# Patient Record
Sex: Male | Born: 1950 | Race: White | Hispanic: No | Smoking: Former smoker
Health system: Southern US, Community
[De-identification: ages and names within clinical notes are randomized; demographics above are authoritative.]

## PROBLEM LIST (undated history)

## (undated) DIAGNOSIS — R0600 Dyspnea, unspecified: Secondary | ICD-10-CM

## (undated) DIAGNOSIS — I714 Abdominal aortic aneurysm, without rupture, unspecified: Secondary | ICD-10-CM

## (undated) DIAGNOSIS — T8859XA Other complications of anesthesia, initial encounter: Secondary | ICD-10-CM

## (undated) DIAGNOSIS — J449 Chronic obstructive pulmonary disease, unspecified: Secondary | ICD-10-CM

## (undated) DIAGNOSIS — E7800 Pure hypercholesterolemia, unspecified: Secondary | ICD-10-CM

## (undated) DIAGNOSIS — N183 Chronic kidney disease, stage 3 unspecified: Secondary | ICD-10-CM

## (undated) DIAGNOSIS — I2510 Atherosclerotic heart disease of native coronary artery without angina pectoris: Secondary | ICD-10-CM

## (undated) DIAGNOSIS — I509 Heart failure, unspecified: Secondary | ICD-10-CM

## (undated) DIAGNOSIS — C3490 Malignant neoplasm of unspecified part of unspecified bronchus or lung: Secondary | ICD-10-CM

## (undated) DIAGNOSIS — I10 Essential (primary) hypertension: Secondary | ICD-10-CM

## (undated) HISTORY — PX: HAND SURGERY: SHX662

## (undated) HISTORY — PX: IRRIGATION AND DEBRIDEMENT SEBACEOUS CYST: SHX5255

## (undated) HISTORY — DX: Malignant neoplasm of unspecified part of unspecified bronchus or lung: C34.90

## (undated) HISTORY — DX: Chronic obstructive pulmonary disease, unspecified: J44.9

## (undated) HISTORY — PX: HERNIA REPAIR: SHX51

## (undated) HISTORY — DX: Abdominal aortic aneurysm, without rupture: I71.4

## (undated) HISTORY — DX: Essential (primary) hypertension: I10

## (undated) HISTORY — PX: UMBILICAL HERNIA REPAIR: SHX196

## (undated) HISTORY — DX: Abdominal aortic aneurysm, without rupture, unspecified: I71.40

## (undated) HISTORY — PX: INCISIONAL HERNIA REPAIR: SHX193

---

## 2014-02-09 ENCOUNTER — Encounter: Payer: Self-pay | Admitting: Cardiology

## 2014-02-10 ENCOUNTER — Encounter: Payer: Self-pay | Admitting: Cardiology

## 2014-02-13 ENCOUNTER — Encounter: Payer: Self-pay | Admitting: *Deleted

## 2014-02-14 ENCOUNTER — Encounter: Payer: Self-pay | Admitting: *Deleted

## 2014-02-14 ENCOUNTER — Encounter: Payer: Self-pay | Admitting: Cardiology

## 2014-02-14 ENCOUNTER — Ambulatory Visit (INDEPENDENT_AMBULATORY_CARE_PROVIDER_SITE_OTHER): Payer: BC Managed Care – PPO | Admitting: Cardiology

## 2014-02-14 VITALS — BP 147/88 | HR 58 | Ht 70.0 in | Wt 198.0 lb

## 2014-02-14 DIAGNOSIS — R0602 Shortness of breath: Secondary | ICD-10-CM

## 2014-02-14 DIAGNOSIS — I10 Essential (primary) hypertension: Secondary | ICD-10-CM

## 2014-02-14 DIAGNOSIS — I5022 Chronic systolic (congestive) heart failure: Secondary | ICD-10-CM

## 2014-02-14 MED ORDER — CARVEDILOL 3.125 MG PO TABS: 3.1250 mg | ORAL_TABLET | Freq: Two times a day (BID) | ORAL | Status: DC

## 2014-02-14 NOTE — Patient Instructions (Signed)
   Begin Coreg 3.125mg  twice a day - new sent to pharm Continue all other medications.   Your physician has requested that you have a lexiscan myoview. For further information please visit [email or web address]. Please follow instruction sheet, as given. Office will contact with results via phone or letter.   Your physician has requested that you regularly monitor and record your blood pressure readings at home. Please check approximately 1-2 hours after taking your medication.  Bring readings back to next office visit for MD review.   Follow up in  1 month

## 2014-02-14 NOTE — Progress Notes (Signed)
Clinical Summary Nicolas Butler is a 63 y.o.male seen today as a new patient for the following medical problems.  1.HTN - recent admit to [facility] 02/09/14 with severely elevated blood pressures, he had not been on any HTN meds at home. He was originally seen in an outpatient clinic for a cyst removal from his shoulder, and bp was noted to be 241/143. He was sent to ER - despite very high bp, he was asymptomatic. He reportedly had a very mild trop increase of 0.08, along with a BUN of 22 and Cr of 1.4. K 3.7 - initiated initial on IV bp meds, then converted to oral.   - reports compliance with home meds since discharge  2.Chronic Systolic Heart failure - echo during admission showed LVEF 40-45%, mod LVH, abnormal but ungraded diastolic function,  reported akinesis of the basal anterolateral, mid-anterolateral, and apical lateral wall segments.   - denies any chest pain. Does have SOB/DOE, often with high levels of exertion. 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  No orthopnea, no PND, no LE edema    Past Medical History  Diagnosis Date  . Unspecified essential hypertension      No Known Allergies   Current Outpatient Prescriptions  Medication Sig Dispense Refill  . amLODipine (NORVASC) 5 MG tablet Take 1 tablet by mouth daily.      . cephALEXin (KEFLEX) 500 MG capsule Take 1 capsule by mouth 4 (four) times daily.      Marland Kitchen lisinopril-hydrochlorothiazide (PRINZIDE,ZESTORETIC) 20-12.5 MG per tablet Take 1 tablet by mouth daily.      Marland Kitchen NITROSTAT 0.4 MG SL tablet Place 1 tablet under the tongue every 5 (five) minutes x 3 doses as needed.      . carvedilol (COREG) 3.125 MG tablet Take 1 tablet (3.125 mg total) by mouth 2 (two) times daily.  60 tablet  6   No current facility-administered medications for this visit.     Past Surgical History  Procedure Laterality Date  . Hand surgery Left      No Known Allergies    Family History    Problem Relation Age of Onset  . Diabetes Son   . Pneumonia Mother   . Other Father     brain tumor     Social History Mr. Oki reports that he quit smoking about 14 months ago. His smoking use included Cigarettes. He started smoking about 48 years ago. He smoked 0.00 packs per day for 46 years. He has never used smokeless tobacco. Mr. Wollman has no alcohol history on file.   Review of Systems CONSTITUTIONAL: No weight loss, fever, chills, weakness or fatigue.  HEENT: Eyes: No visual loss, blurred vision, double vision or yellow sclerae.No hearing loss, sneezing, congestion, runny nose or sore throat.  SKIN: No rash or itching.  CARDIOVASCULAR: per HPI RESPIRATORY: No shortness of breath, cough or sputum.  GASTROINTESTINAL: No anorexia, nausea, vomiting or diarrhea. No abdominal pain or blood.  GENITOURINARY: No burning on urination, no polyuria NEUROLOGICAL: No headache, dizziness, syncope, paralysis, ataxia, numbness or tingling in the extremities. No change in bowel or bladder control.  MUSCULOSKELETAL: No muscle, back pain, joint pain or stiffness.  LYMPHATICS: No enlarged nodes. No history of splenectomy.  PSYCHIATRIC: No history of depression or anxiety.  ENDOCRINOLOGIC: No reports of sweating, cold or heat intolerance. No polyuria or polydipsia.  Marland Kitchen   Physical Examination Filed Vitals:   02/14/14 1544  BP: 147/88  Pulse: 58   Filed Weights   02/14/14 1544  Weight: 198 lb (89.812 kg)    Gen: resting comfortably, no acute distress HEENT: no scleral icterus, pupils equal round and reactive, no palptable cervical adenopathy,  CV: RRR, no m/r/g, no JVD, no carotid bruits Resp: Clear to auscultation bilaterally GI: abdomen is soft, non-tender, non-distended, normal bowel sounds, no hepatosplenomegaly MSK: extremities are warm, no edema.  Skin: warm, no rash Neuro:  no focal deficits Psych: appropriate affect    Assessment and Plan  1. HTN - elevated in clinic  today, will start coreg 6.25mg  bid in the setting of systolic dysfunction. - he is to bring bp log next visit  2. Chronic systolic heart failure - new diagnosis from admission earlier this month, LVEF 40-45% with wall motion abnormalities, NYHA II - start coreg 3.125mg  bid with further titration as tolerated - refer for non-invasive ischemic evaluation. He has some underlying renal dysfunction and only mild LV systolic dysfunction, defer cath for non-invasive testing at this point  F/u 1 month      Arnoldo Lenis, M.D., F.A.C.C.

## 2014-02-20 ENCOUNTER — Encounter (HOSPITAL_COMMUNITY): Payer: Self-pay

## 2014-02-20 ENCOUNTER — Encounter (HOSPITAL_COMMUNITY)
Admission: RE | Admit: 2014-02-20 | Discharge: 2014-02-20 | Disposition: A | Discharge status: Home / Self Care | Payer: BC Managed Care – PPO | Source: Ambulatory Visit | Attending: Cardiology | Admitting: Cardiology

## 2014-02-20 ENCOUNTER — Other Ambulatory Visit: Payer: Self-pay | Admitting: Adult Health

## 2014-02-20 ENCOUNTER — Ambulatory Visit (HOSPITAL_COMMUNITY)
Admission: RE | Admit: 2014-02-20 | Discharge: 2014-02-20 | Disposition: A | Discharge status: Home / Self Care | Payer: BC Managed Care – PPO | Source: Ambulatory Visit | Attending: Cardiology | Admitting: Cardiology

## 2014-02-20 ENCOUNTER — Telehealth: Payer: Self-pay | Admitting: Cardiology

## 2014-02-20 DIAGNOSIS — I2510 Atherosclerotic heart disease of native coronary artery without angina pectoris: Secondary | ICD-10-CM | POA: Insufficient documentation

## 2014-02-20 DIAGNOSIS — R0602 Shortness of breath: Secondary | ICD-10-CM | POA: Insufficient documentation

## 2014-02-20 DIAGNOSIS — R0989 Other specified symptoms and signs involving the circulatory and respiratory systems: Principal | ICD-10-CM | POA: Insufficient documentation

## 2014-02-20 DIAGNOSIS — R0609 Other forms of dyspnea: Secondary | ICD-10-CM | POA: Insufficient documentation

## 2014-02-20 MED ORDER — SODIUM CHLORIDE 0.9 % IJ SOLN
INTRAMUSCULAR | Status: AC
  Filled 2014-02-20: qty 10

## 2014-02-20 MED ORDER — TECHNETIUM TC 99M SESTAMIBI - CARDIOLITE
30.0000 | Freq: Once | INTRAVENOUS | Status: AC | PRN
  Administered 2014-02-20: 10:00:00 30 via INTRAVENOUS

## 2014-02-20 MED ORDER — TECHNETIUM TC 99M SESTAMIBI GENERIC - CARDIOLITE
10.0000 | Freq: Once | INTRAVENOUS | Status: AC | PRN
  Administered 2014-02-20: 10 via INTRAVENOUS

## 2014-02-20 MED ORDER — REGADENOSON 0.4 MG/5ML IV SOLN
0.4000 mg | Freq: Once | INTRAVENOUS | Status: AC | PRN
  Administered 2014-02-20: 0.4 mg via INTRAVENOUS

## 2014-02-20 MED ORDER — REGADENOSON 0.4 MG/5ML IV SOLN
INTRAVENOUS | Status: AC
  Filled 2014-02-20: qty 5

## 2014-02-20 MED ORDER — SODIUM CHLORIDE 0.9 % IJ SOLN
10.0000 mL | INTRAMUSCULAR | Status: DC | PRN
  Administered 2014-02-20: 10 mL via INTRAVENOUS

## 2014-02-20 NOTE — Telephone Encounter (Signed)
Stress test shows evidence of possible old damage to his heart from prior blockages, there does not appear to be any severe active blockages at this time. From a work standpoint if he is not having any symptoms of chest pain or significant SOB he is ok to return to work at the present time. He has been started on several medications that over time should help his heart   Zandra Abts MD

## 2014-02-20 NOTE — Progress Notes (Signed)
Stress Lab Nurses Notes - Nicolas Butler  Nicolas Butler 02/20/2014 Reason for doing test: Dyspnea Type of test: Wille Glaser Nurse performing test: Gerrit Halls, RN Nuclear Medicine Tech: Dyanne Carrel Echo Tech: Not Applicable MD performing test: Dr. Lynann Beaver.Lawrence NP Family MD: NPCP Test explained and consent signed: yes IV started: 22g jelco, Saline lock flushed, No redness or edema and Saline lock started in radiology Symptoms: SOB & Flushed Treatment/Intervention: None Reason test stopped: protocol completed After recovery IV was: Discontinued via X-ray tech and No redness or edema Patient to return to [facility]. Med at : 11:25 Patient discharged: Home Patient's Condition upon discharge was: stable Comments: During test BP 123/79 & HR 72.  Recovery BP 139/92 & HR 69.  Symptoms resolved in recovery. Geanie Cooley T

## 2014-02-20 NOTE — Telephone Encounter (Signed)
Patient would like to know when he is allowed to return to work

## 2014-02-20 NOTE — Telephone Encounter (Signed)
Patient questioning when he can return to work.  Last seen on 02/14/2014.  At that office visit he was given a letter stating that he could return to work on 02/26/14 without restrictions.  Patient just had stress test today (results are on your desktop).  He is scheduled for follow up on 03/16/2014.  Please let me know if this still stands based off those test results.  Stress test did not appear to be normal.

## 2014-02-21 NOTE — Telephone Encounter (Signed)
Patient notified.  He will keep already scheduled follow up for 03/16/2014 with Dr. Harl Bowie.

## 2014-03-04 DIAGNOSIS — I5022 Chronic systolic (congestive) heart failure: Secondary | ICD-10-CM | POA: Insufficient documentation

## 2014-03-04 DIAGNOSIS — I10 Essential (primary) hypertension: Secondary | ICD-10-CM | POA: Insufficient documentation

## 2014-03-14 ENCOUNTER — Encounter: Payer: Self-pay | Admitting: *Deleted

## 2014-03-16 ENCOUNTER — Ambulatory Visit (INDEPENDENT_AMBULATORY_CARE_PROVIDER_SITE_OTHER): Payer: BC Managed Care – PPO | Admitting: Cardiology

## 2014-03-16 ENCOUNTER — Encounter: Payer: Self-pay | Admitting: Cardiology

## 2014-03-16 VITALS — BP 152/98 | HR 56 | Ht 70.0 in | Wt 196.0 lb

## 2014-03-16 DIAGNOSIS — I2510 Atherosclerotic heart disease of native coronary artery without angina pectoris: Secondary | ICD-10-CM

## 2014-03-16 DIAGNOSIS — I5022 Chronic systolic (congestive) heart failure: Secondary | ICD-10-CM

## 2014-03-16 DIAGNOSIS — Z79899 Other long term (current) drug therapy: Secondary | ICD-10-CM

## 2014-03-16 DIAGNOSIS — I10 Essential (primary) hypertension: Secondary | ICD-10-CM

## 2014-03-16 MED ORDER — LISINOPRIL 40 MG PO TABS: 40.0000 mg | ORAL_TABLET | Freq: Every day | ORAL | Status: DC

## 2014-03-16 MED ORDER — HYDROCHLOROTHIAZIDE 12.5 MG PO CAPS: 12.5000 mg | ORAL_CAPSULE | Freq: Every day | ORAL | Status: DC

## 2014-03-16 NOTE — Progress Notes (Signed)
Clinical Summary Nicolas Butler is a 64 y.o.male seen today for follow up of the following medical problems.   1.HTN  - admit to [facility] 02/09/14 with severely elevated blood pressures, he had not been on any HTN meds at home. He was originally seen in an outpatient clinic for a cyst removal from his shoulder, and bp was noted to be 241/143. He was sent to ER  - despite very high bp, he was asymptomatic. He reportedly had a very mild trop increase of 0.08, along with a BUN of 22 and Cr of 1.4. K 3.7  - initiated initial on IV bp meds, then converted to oral.  - reports compliance with home meds since discharge   - bp log since our last visit 120-140s/70-80s.  - compliant with meds, denies any lightheadedness or dizziness  2.Chronic Systolic Heart failure  - echo during admission showed LVEF 40-45%, mod LVH, abnormal but ungraded diastolic function, reported akinesis of the basal anterolateral, mid-anterolateral, and apical lateral wall segments.  - denies any chest pain.SOB/DOE has signficantly since starting medical therapy.  - No orthopnea, no PND, no LE edema  - since last visit completed a lexiscan MPI which showed moderate sized interoapical and inferolateral infarct with mild peri-infarct ischemia.  Past Medical History  Diagnosis Date  . Unspecified essential hypertension      No Known Allergies   Current Outpatient Prescriptions  Medication Sig Dispense Refill  . amLODipine (NORVASC) 5 MG tablet Take 1 tablet by mouth daily.      . carvedilol (COREG) 3.125 MG tablet Take 1 tablet (3.125 mg total) by mouth 2 (two) times daily.  60 tablet  6  . cephALEXin (KEFLEX) 500 MG capsule Take 1 capsule by mouth 4 (four) times daily.      Marland Kitchen lisinopril-hydrochlorothiazide (PRINZIDE,ZESTORETIC) 20-12.5 MG per tablet Take 1 tablet by mouth daily.      Marland Kitchen NITROSTAT 0.4 MG SL tablet Place 1 tablet under the tongue every 5 (five) minutes x 3 doses as needed.       No current  facility-administered medications for this visit.     Past Surgical History  Procedure Laterality Date  . Hand surgery Left   . Umbilical hernia repair    . Incisional hernia repair      LEFT  . Irrigation and debridement sebaceous cyst      FROM RIGHT SHOULDER     No Known Allergies    Family History  Problem Relation Age of Onset  . Diabetes Son   . Pneumonia Mother   . Other Father     brain tumor     Social History Mr. Paynter reports that he quit smoking about 15 months ago. His smoking use included Cigarettes. He started smoking about 48 years ago. He smoked 0.00 packs per day for 46 years. He has never used smokeless tobacco. Mr. Trimpe has no alcohol history on file.   Review of Systems CONSTITUTIONAL: No weight loss, fever, chills, weakness or fatigue.  HEENT: Eyes: No visual loss, blurred vision, double vision or yellow sclerae.No hearing loss, sneezing, congestion, runny nose or sore throat.  SKIN: No rash or itching.  CARDIOVASCULAR: per HPI RESPIRATORY: no cough, no sputum  GASTROINTESTINAL: No anorexia, nausea, vomiting or diarrhea. No abdominal pain or blood.  GENITOURINARY: No burning on urination, no polyuria NEUROLOGICAL: No headache, dizziness, syncope, paralysis, ataxia, numbness or tingling in the extremities. No change in bowel or bladder control.  MUSCULOSKELETAL: No muscle, back  pain, joint pain or stiffness.  LYMPHATICS: No enlarged nodes. No history of splenectomy.  PSYCHIATRIC: No history of depression or anxiety.  ENDOCRINOLOGIC: No reports of sweating, cold or heat intolerance. No polyuria or polydipsia.  Marland Kitchen   Physical Examination p 56 bp 152/98 Wt 196 lbs BMI 28 Gen: resting comfortably, no acute distress HEENT: no scleral icterus, pupils equal round and reactive, no palptable cervical adenopathy,  CV: RRR, no m/r/g, no JVD, no carotid bruits Resp: Clear to auscultation bilaterally GI: abdomen is soft, non-tender, non-distended,  normal bowel sounds, no hepatosplenomegaly MSK: extremities are warm, no edema.  Skin: warm, no rash Neuro:  no focal deficits Psych: appropriate affect   Diagnostic Studies 02/2014 MPI Raw images showed appropriate radiotracer uptake. There is a  moderate-sized mildly reversible inferoapical and inferolateral wall  defect. There are no other myocardial perfusion defects. There is  mild inferolateral wall hypokinesis.  Gated imaging shows end-diastolic volume 594 mL, and systolic volume  90 mL, left ventricular ejection fraction 40%.  IMPRESSION:  1. Abnormal Lexiscan MPI  2. 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  3. Decreased left ventricular systolic function, LVEF 58%  4. Increased study for major cardiac events due to decreased LV  systolic function, there is fairly mild myocardium at jeopardy     Assessment and Plan  1. HTN  - number much improved since starting medical therapy, given his CKD still not at goal of <130/80 - increase lisinopril to 40mg  daily, continue home bp log - check BMET in 2 weeks  2. Chronic systolic heart failure  - new diagnosis from admission earlier this month, LVEF 40-45% with wall motion abnormalities, NYHA II  - heart rate of 55 in clinic, no further coreg titration at this time. Increase lisinopril to 40mg  daily.  - MPI shows evidence of possible old infarct, likely a component of ICM, though could be mixed with a HTN CM as well.   3. Presumed CAD - MPI and echo with evidence of old infarct, no current chest pain. Only mild peri-infarct ischemia that is asymptomatic, no indication for invasive testing at this time - start ASA 81mg  daily, check lipid panel and Hgb A1c for further risk stratification    F/u 1 month    Arnoldo Lenis, M.D., F.A.C.C.

## 2014-03-16 NOTE — Patient Instructions (Signed)
   Stop Prinzide  Begin Lisinopril 40mg  daily  Begin HCTZ 12.5mg  daily  New medications sent to pharm  Begin Aspirin 81mg  daily - OTC Continue all other medications.   Your physician has requested that you regularly monitor and record your blood pressure readings at home. Please check approximately 2 hours after medication & bring to next office visit for MD review. Labs for FLP, BMET, [place] Office will contact with results via phone or letter.   Follow up in  1 month

## 2014-04-13 ENCOUNTER — Encounter: Payer: Self-pay | Admitting: Cardiology

## 2014-04-13 ENCOUNTER — Ambulatory Visit (INDEPENDENT_AMBULATORY_CARE_PROVIDER_SITE_OTHER): Payer: BC Managed Care – PPO | Admitting: Cardiology

## 2014-04-13 VITALS — BP 144/92 | HR 68 | Ht 70.0 in | Wt 199.0 lb

## 2014-04-13 DIAGNOSIS — I5022 Chronic systolic (congestive) heart failure: Secondary | ICD-10-CM

## 2014-04-13 DIAGNOSIS — I10 Essential (primary) hypertension: Secondary | ICD-10-CM

## 2014-04-13 MED ORDER — AMLODIPINE BESYLATE 5 MG PO TABS: 5.0000 mg | ORAL_TABLET | Freq: Every day | ORAL | Status: DC

## 2014-04-13 MED ORDER — ASPIRIN EC 81 MG PO TBEC: 81.0000 mg | DELAYED_RELEASE_TABLET | Freq: Every day | ORAL | Status: DC

## 2014-04-13 NOTE — Patient Instructions (Signed)
   Aspirin 81mg  daily added to med list  Norvasc refill sent to local pharm Continue all other medications.   Your physician wants you to follow up in:  4 months.  You will receive a reminder letter in the mail one-two months in advance.  If you don't receive a letter, please call our office to schedule the follow up appointment

## 2014-04-13 NOTE — Progress Notes (Signed)
Clinical Summary Nicolas Butler is a 63 y.o.male seen today for follow up of the following medical problems.   1.HTN  - admit to [facility] 02/09/14 with severely elevated blood pressures, he had not been on any HTN meds at home. He was originally seen in an outpatient clinic for a cyst removal from his shoulder, and bp was noted to be 241/143. He was sent to ER  - despite very high bp, he was asymptomatic. He reportedly had a very mild trop increase of 0.08, along with a BUN of 22 and Cr of 1.4. K 3.7  - initiated initially on IV bp meds, then converted to oral.  - reports compliance with home meds since discharge  - bp log since our last visit 120-130s/70-80s.  - compliant with meds, denies any lightheadedness or dizziness   2.Chronic Systolic Heart failure  - echo during admission showed LVEF 40-45%, mod LVH, abnormal but ungraded diastolic function, reported akinesis of the basal anterolateral, mid-anterolateral, and apical lateral wall segments.  - denies any chest pain.SOB/DOE has signficantly improved since starting medical therapy.  - No orthopnea, no PND, no LE edema  - lexiscan MPI which showed moderate sized interoapical and inferolateral infarct with mild peri-infarct ischemia.   3. Presumed CAD - based on echo and MPI results, MPI with suggestion of prior infarcts with only mild peri-infarct ischemia. - denies any chest pain, he has started ASA 81mg  since last visit - awaiting lipid panel, reports it was drawn at [facility] last week. Awaiting HgbA1c as well for further risk stratification.    Past Medical History  Diagnosis Date  . Unspecified essential hypertension      No Known Allergies   Current Outpatient Prescriptions  Medication Sig Dispense Refill  . amLODipine (NORVASC) 5 MG tablet Take 1 tablet by mouth daily.      . carvedilol (COREG) 3.125 MG tablet Take 1 tablet (3.125 mg total) by mouth 2 (two) times daily.  60 tablet  6  . hydrochlorothiazide (MICROZIDE)  12.5 MG capsule Take 1 capsule (12.5 mg total) by mouth daily.  30 capsule  6  . lisinopril (PRINIVIL,ZESTRIL) 40 MG tablet Take 1 tablet (40 mg total) by mouth daily.  30 tablet  6  . NITROSTAT 0.4 MG SL tablet Place 1 tablet under the tongue every 5 (five) minutes x 3 doses as needed.       No current facility-administered medications for this visit.     Past Surgical History  Procedure Laterality Date  . Hand surgery Left   . Umbilical hernia repair    . Incisional hernia repair      LEFT  . Irrigation and debridement sebaceous cyst      FROM RIGHT SHOULDER     No Known Allergies    Family History  Problem Relation Age of Onset  . Diabetes Son   . Pneumonia Mother   . Other Father     brain tumor     Social History Nicolas Butler reports that he quit smoking about 16 months ago. His smoking use included Cigarettes. He started smoking about 48 years ago. He smoked 0.00 packs per day for 46 years. He has never used smokeless tobacco. Nicolas Butler has no alcohol history on file.   Review of Systems CONSTITUTIONAL: No weight loss, fever, chills, weakness or fatigue.  HEENT: Eyes: No visual loss, blurred vision, double vision or yellow sclerae.No hearing loss, sneezing, congestion, runny nose or sore throat.  SKIN: No  rash or itching.  CARDIOVASCULAR: per HPI RESPIRATORY: No shortness of breath, cough or sputum.  GASTROINTESTINAL: No anorexia, nausea, vomiting or diarrhea. No abdominal pain or blood.  GENITOURINARY: No burning on urination, no polyuria NEUROLOGICAL: No headache, dizziness, syncope, paralysis, ataxia, numbness or tingling in the extremities. No change in bowel or bladder control.  MUSCULOSKELETAL: No muscle, back pain, joint pain or stiffness.  LYMPHATICS: No enlarged nodes. No history of splenectomy.  PSYCHIATRIC: No history of depression or anxiety.  ENDOCRINOLOGIC: No reports of sweating, cold or heat intolerance. No polyuria or polydipsia.   Marland Kitchen   Physical Examination p 68 bp 144/92 Wt 199 lbs BMI 29 Gen: resting comfortably, no acute distress HEENT: no scleral icterus, pupils equal round and reactive, no palptable cervical adenopathy,  CV: RRR, no m/r/g, no JVD, no carotid bruits Resp: Clear to auscultation bilaterally GI: abdomen is soft, non-tender, non-distended, normal bowel sounds, no hepatosplenomegaly MSK: extremities are warm, no edema.  Skin: warm, no rash Neuro:  no focal deficits Psych: appropriate affect   Diagnostic Studies 02/2014 MPI  Raw images showed appropriate radiotracer uptake. There is a  moderate-sized mildly reversible inferoapical and inferolateral wall  defect. There are no other myocardial perfusion defects. There is  mild inferolateral wall hypokinesis.  Gated imaging shows end-diastolic volume 163 mL, and systolic volume  90 mL, left ventricular ejection fraction 40%.  IMPRESSION:  1. Abnormal Lexiscan MPI  2. 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  3. Decreased left ventricular systolic function, LVEF 84%  4. Increased study for major cardiac events due to decreased LV  systolic function, there is fairly mild myocardium at jeopardy   02/2014 Echo LVEF 40-45%, abnormal diastolic function. Multiple WMAs,      Assessment and Plan  1. HTN  - at goal based home home bp log, given his CKD goal of <130/80  - awaiting repeat BMET after lisinopril increase a few weeks ago  2. Chronic systolic heart failure  - new diagnosis from admission 02/2014, LVEF 40-45% with wall motion abnormalities, NYHA II  - MPI shows evidence of possible old infarct, likely a component of ICM, though could be mixed with a HTN CM as well.  - limited beta blocker titration due to low resting heart rates.   - no aldactone at this time due to borderline renal function  3. Presumed CAD  - MPI and echo with evidence of old infarct, no current  chest pain. Only mild peri-infarct ischemia that is asymptomatic, no indication for invasive testing at this time. He also has borderline renal function and is at increased risk for cath.  - will have nursing staff call about labs drawn last week. Likely will need a statin, f/u lipid profile.    F/u 4 months       Arnoldo Lenis, M.D., F.A.C.C.

## 2014-04-26 ENCOUNTER — Telehealth: Payer: Self-pay | Admitting: *Deleted

## 2014-04-26 DIAGNOSIS — I10 Essential (primary) hypertension: Secondary | ICD-10-CM

## 2014-04-26 DIAGNOSIS — E7800 Pure hypercholesterolemia, unspecified: Secondary | ICD-10-CM

## 2014-04-26 MED ORDER — ATORVASTATIN CALCIUM 80 MG PO TABS: 80.0000 mg | ORAL_TABLET | Freq: Every day | ORAL | Status: DC

## 2014-04-26 NOTE — Telephone Encounter (Signed)
Message copied by Laurine Blazer on Thu Apr 26, 2014  1:12 PM ------      Message from: [place] F      Created: Tue Apr 17, 2014 12:49 PM       Cholesterol is too high. He needs a repeat CMET prior to starting a statin, once results in will start lipitor 80mg  daily.                  Zandra Abts MD ------

## 2014-04-26 NOTE — Telephone Encounter (Signed)
Notes Recorded by Laurine Blazer, LPN on 2/56/3893 at 7:34 PM Patient notified. Will fax lab order to [facility] & he will go in the next few days. Will also send new rx to [place].

## 2014-08-21 ENCOUNTER — Ambulatory Visit (INDEPENDENT_AMBULATORY_CARE_PROVIDER_SITE_OTHER): Payer: 59 | Admitting: Cardiology

## 2014-08-21 ENCOUNTER — Encounter: Payer: Self-pay | Admitting: Cardiology

## 2014-08-21 VITALS — BP 151/98 | HR 64 | Ht 70.0 in | Wt 197.0 lb

## 2014-08-21 DIAGNOSIS — I2510 Atherosclerotic heart disease of native coronary artery without angina pectoris: Secondary | ICD-10-CM

## 2014-08-21 DIAGNOSIS — I10 Essential (primary) hypertension: Secondary | ICD-10-CM

## 2014-08-21 DIAGNOSIS — I5022 Chronic systolic (congestive) heart failure: Secondary | ICD-10-CM

## 2014-08-21 NOTE — Patient Instructions (Addendum)
Your physician wants you to follow-up in: 6 months. You will receive a reminder letter in the mail two months in advance. If you don't receive a letter, please call our office to schedule the follow-up appointment.  Your physician recommends that you continue on your current medications as directed. Please refer to the Current Medication list given to you today.   Thank you for choosing [facility]!

## 2014-08-21 NOTE — Progress Notes (Signed)
Clinical Summary Nicolas Butler is a 63 y.o.male seen today for follow up of the following medical problems.   1.HTN  - checks at home daily. Typically around 130s/80s - compliant with meds  2.Chronic Systolic Heart failure  - echo showed LVEF 40-45%, mod LVH, abnormal but ungraded diastolic function, reported akinesis of the basal anterolateral, mid-anterolateral, and apical lateral wall segments.  - lexiscan MPI  showed moderate sized interoapical and inferolateral infarct with mild peri-infarct ischemia.   - denies any SOB, no DOE, no LE edema - compliant with meds. No lightheadendess or dizziness  3. Presumed CAD - based on echo and MPI results, MPI with suggestion of prior infarcts with only mild peri-infarct ischemia. This has been medically managed.  - denies any chest pain  4. HL - lipid panel 03/2014 TC 233 TG 181 HDL 19 LDL 178. This lipid panel is prior to starting statin, currently on lipitor 80mg  daily.   Past Medical History  Diagnosis Date  . Unspecified essential hypertension      No Known Allergies   Current Outpatient Prescriptions  Medication Sig Dispense Refill  . amLODipine (NORVASC) 5 MG tablet Take 1 tablet (5 mg total) by mouth daily. 30 tablet 6  . aspirin EC 81 MG tablet Take 1 tablet (81 mg total) by mouth daily.    Marland Kitchen atorvastatin (LIPITOR) 80 MG tablet Take 1 tablet (80 mg total) by mouth daily. 30 tablet 6  . carvedilol (COREG) 3.125 MG tablet Take 1 tablet (3.125 mg total) by mouth 2 (two) times daily. 60 tablet 6  . hydrochlorothiazide (MICROZIDE) 12.5 MG capsule Take 1 capsule (12.5 mg total) by mouth daily. 30 capsule 6  . lisinopril (PRINIVIL,ZESTRIL) 40 MG tablet Take 1 tablet (40 mg total) by mouth daily. 30 tablet 6  . NITROSTAT 0.4 MG SL tablet Place 1 tablet under the tongue every 5 (five) minutes x 3 doses as needed.     No current facility-administered medications for this visit.     Past Surgical History  Procedure  Laterality Date  . Hand surgery Left   . Umbilical hernia repair    . Incisional hernia repair      LEFT  . Irrigation and debridement sebaceous cyst      FROM RIGHT SHOULDER     No Known Allergies    Family History  Problem Relation Age of Onset  . Diabetes Son   . Pneumonia Mother   . Other Father     brain tumor     Social History Mr. Stief reports that he quit smoking about 20 months ago. His smoking use included Cigarettes. He started smoking about 49 years ago. He smoked 0.00 packs per day for 46 years. He has never used smokeless tobacco. Mr. Bean has no alcohol history on file.   Review of Systems CONSTITUTIONAL: No weight loss, fever, chills, weakness or fatigue.  HEENT: Eyes: No visual loss, blurred vision, double vision or yellow sclerae.No hearing loss, sneezing, congestion, runny nose or sore throat.  SKIN: No rash or itching.  CARDIOVASCULAR: per HPI RESPIRATORY: No shortness of breath, cough or sputum.  GASTROINTESTINAL: No anorexia, nausea, vomiting or diarrhea. No abdominal pain or blood.  GENITOURINARY: No burning on urination, no polyuria NEUROLOGICAL: No headache, dizziness, syncope, paralysis, ataxia, numbness or tingling in the extremities. No change in bowel or bladder control.  MUSCULOSKELETAL: No muscle, back pain, joint pain or stiffness.  LYMPHATICS: No enlarged nodes. No history of splenectomy.  PSYCHIATRIC:  No history of depression or anxiety.  ENDOCRINOLOGIC: No reports of sweating, cold or heat intolerance. No polyuria or polydipsia.  Marland Kitchen   Physical Examination p 56 bp 116/78 Wt 230 lbs BMI 34 Gen: resting comfortably, no acute distress HEENT: no scleral icterus, pupils equal round and reactive, no palptable cervical adenopathy,  CV: RRR, no m/r/g, no JVD, no carotid bruits Resp: Clear to auscultation bilaterally GI: abdomen is soft, non-tender, non-distended, normal bowel sounds, no hepatosplenomegaly MSK: extremities are warm, no  edema.  Skin: warm, no rash Neuro:  no focal deficits Psych: appropriate affect   Diagnostic Studies 02/2014 MPI  Raw images showed appropriate radiotracer uptake. There is a  moderate-sized mildly reversible inferoapical and inferolateral wall  defect. There are no other myocardial perfusion defects. There is  mild inferolateral wall hypokinesis.  Gated imaging shows end-diastolic volume 001 mL, and systolic volume  90 mL, left ventricular ejection fraction 40%.  IMPRESSION:  1. Abnormal Lexiscan MPI  2. 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  3. Decreased left ventricular systolic function, LVEF 74%  4. Increased study for major cardiac events due to decreased LV  systolic function, there is fairly mild myocardium at jeopardy   02/2014 Echo LVEF 40-45%, abnormal diastolic function. Multiple WMAs,     Assessment and Plan  1. HTN  - at goal based home home bp, continue current meds  2. Chronic systolic heart failure  - new diagnosis from admission 02/2014, LVEF 40-45% with wall motion abnormalities, NYHA II  - MPI shows evidence of possible old infarct, likely a component of ICM, though could be mixed with a HTN CM as well.  - limited beta blocker titration due to low resting heart rates.  - no aldactone at this time due to borderline renal function - continue current meds  3. Presumed CAD  - MPI and echo with evidence of old infarct, no current chest pain. Only mild peri-infarct ischemia that is asymptomatic, no indication for invasive testing at this time. He also has borderline renal function and is at increased risk for cath.  - continue medical therapy and risk factor modification   F/u 6 months      Arnoldo Lenis, M.D.

## 2014-09-19 ENCOUNTER — Telehealth: Payer: Self-pay | Admitting: *Deleted

## 2014-09-19 NOTE — Telephone Encounter (Signed)
[facility] in [place] called to ok refills on carvedilol. Ok'd 6 refills

## 2014-11-23 ENCOUNTER — Other Ambulatory Visit: Payer: Self-pay | Admitting: *Deleted

## 2014-11-23 MED ORDER — AMLODIPINE BESYLATE 5 MG PO TABS: 5.0000 mg | ORAL_TABLET | Freq: Every day | ORAL | Status: DC

## 2015-01-24 ENCOUNTER — Other Ambulatory Visit: Payer: Self-pay | Admitting: Cardiology

## 2015-01-24 MED ORDER — ATORVASTATIN CALCIUM 80 MG PO TABS: 80.0000 mg | ORAL_TABLET | Freq: Every day | ORAL | Status: DC

## 2015-01-24 NOTE — Telephone Encounter (Signed)
Medication sent to pharmacy  

## 2015-01-24 NOTE — Telephone Encounter (Signed)
Received fax refill request  Rx # [id number] Medication:  Atorvastatin 80 mg tablet  Qty 30 Sig:  Take one tablet by mouth daily Physician:  Harl Bowie

## 2015-01-25 ENCOUNTER — Other Ambulatory Visit: Payer: Self-pay

## 2015-01-25 MED ORDER — ATORVASTATIN CALCIUM 80 MG PO TABS: 80.0000 mg | ORAL_TABLET | Freq: Every day | ORAL | Status: DC

## 2015-01-25 NOTE — Telephone Encounter (Signed)
Refill complete 

## 2015-07-25 DIAGNOSIS — E785 Hyperlipidemia, unspecified: Secondary | ICD-10-CM | POA: Insufficient documentation

## 2015-07-25 DIAGNOSIS — E559 Vitamin D deficiency, unspecified: Secondary | ICD-10-CM | POA: Insufficient documentation

## 2015-07-25 DIAGNOSIS — I2510 Atherosclerotic heart disease of native coronary artery without angina pectoris: Secondary | ICD-10-CM | POA: Insufficient documentation

## 2015-12-10 ENCOUNTER — Encounter: Payer: Self-pay | Admitting: Vascular Surgery

## 2015-12-19 ENCOUNTER — Ambulatory Visit (INDEPENDENT_AMBULATORY_CARE_PROVIDER_SITE_OTHER): Payer: BLUE CROSS/BLUE SHIELD | Admitting: Vascular Surgery

## 2015-12-19 ENCOUNTER — Encounter: Payer: Self-pay | Admitting: Vascular Surgery

## 2015-12-19 VITALS — BP 128/79 | HR 74 | Ht 70.0 in | Wt 198.0 lb

## 2015-12-19 DIAGNOSIS — I714 Abdominal aortic aneurysm, without rupture, unspecified: Secondary | ICD-10-CM

## 2015-12-19 NOTE — Progress Notes (Signed)
Referring Physician: Bridget Hartshorn, RN  Patient name: Nicolas Butler MRN: [id number] DOB: 26-Mar-1951 Sex: male  REASON FOR CONSULT: Abdominal aortic aneurysm  HPI: Nicolas Butler is a 65 y.o. male, referred for evaluation of asymptomatic abdominal aortic aneurysm. The patient's aneurysm was discovered on an ultrasound looking at possible sources of elevated creatinine. This was found to be 4.8 cm on 10/28/2015. The patient denies any family history of aneurysms. Chronic medical problems include hypertension hyperlipidemia both of which are been stable. He also has some cardiac dysfunction and states his last ejection fraction was 40%. He is on aspirin and a statin. He also has some underlying lung problems with shortness of breath with minimal activity.   Past Medical History  Diagnosis Date  . Unspecified essential hypertension   . AAA (abdominal aortic aneurysm) [facility])    Past Surgical History  Procedure Laterality Date  . Hand surgery Left   . Umbilical hernia repair    . Incisional hernia repair      LEFT  . Irrigation and debridement sebaceous cyst      FROM RIGHT SHOULDER    Family History  Problem Relation Age of Onset  . Diabetes Son   . Pneumonia Mother   . Other Father     brain tumor    SOCIAL HISTORY: Social History   Social History  . Marital Status: Legally Separated    Spouse Name: N/A  . Number of Children: N/A  . Years of Education: N/A   Occupational History  . Not on file.   Social History Main Topics  . Smoking status: Former Smoker -- 21 years    Types: Cigarettes    Start date: 05/17/1965    Quit date: 12/07/2012  . Smokeless tobacco: Never Used  . Alcohol Use: No  . Drug Use: No  . Sexual Activity: Not on file   Other Topics Concern  . Not on file   Social History Narrative    Allergies  Allergen Reactions  . Hydrochlorothiazide Other (See Comments)    Pt states "it made my legs hurt"    Current Outpatient  Prescriptions  Medication Sig Dispense Refill  . amLODipine (NORVASC) 5 MG tablet Take 1 tablet (5 mg total) by mouth daily. 30 tablet 4  . aspirin EC 81 MG tablet Take 1 tablet (81 mg total) by mouth daily.    Marland Kitchen atorvastatin (LIPITOR) 80 MG tablet Take 1 tablet (80 mg total) by mouth daily. 30 tablet 3  . carvedilol (COREG) 3.125 MG tablet Take 1 tablet (3.125 mg total) by mouth 2 (two) times daily. 60 tablet 6  . lisinopril (PRINIVIL,ZESTRIL) 40 MG tablet Take 1 tablet (40 mg total) by mouth daily. 30 tablet 6  . NITROSTAT 0.4 MG SL tablet Place 1 tablet under the tongue every 5 (five) minutes x 3 doses as needed.    . hydrochlorothiazide (MICROZIDE) 12.5 MG capsule Take 1 capsule (12.5 mg total) by mouth daily. (Patient not taking: Reported on 12/19/2015) 30 capsule 6   No current facility-administered medications for this visit.    ROS:   General:  No weight loss, Fever, chills  HEENT: No recent headaches, no nasal bleeding, no visual changes, no sore throat  Neurologic: No dizziness, blackouts, seizures. No recent symptoms of stroke or mini- stroke. No recent episodes of slurred speech, or temporary blindness.  Cardiac: No recent episodes of chest pain/pressure, no shortness of breath at rest.  + shortness of breath with exertion.  Denies history of atrial fibrillation or irregular heartbeat  Vascular: No history of rest pain in feet.  No history of claudication.  No history of non-healing ulcer, No history of DVT   Pulmonary: No home oxygen, no productive cough, no hemoptysis,  No asthma or wheezing  Musculoskeletal:  '[ ]'$  Arthritis, '[ ]'$  Low back pain,  '[ ]'$  Joint pain  Hematologic:No history of hypercoagulable state.  No history of easy bleeding.  No history of anemia  Gastrointestinal: No hematochezia or melena,  No gastroesophageal reflux, no trouble swallowing  Urinary: '[ ]'$  chronic Kidney disease, '[ ]'$  on HD - '[ ]'$  MWF or '[ ]'$  TTHS, '[ ]'$  Burning with urination, '[ ]'$  Frequent  urination, '[ ]'$  Difficulty urinating;   Skin: No rashes  Psychological: No history of anxiety,  No history of depression   Physical Examination  Filed Vitals:   12/19/15 0937  BP: 128/79  Pulse: 74  Height: '5\' 10"'$  (1.778 m)  Weight: 198 lb (89.812 kg)  SpO2: 95%    Body mass index is 28.41 kg/(m^2).  General:  Alert and oriented, no acute distress HEENT: Normal Neck: No bruit or JVD Pulmonary: Clear to auscultation bilaterally Cardiac: Regular Rate and Rhythm without murmur Abdomen: Soft, non-tender, non-distended, no mass Skin: No rash Extremity Pulses:  2+ radial, brachial, femoral, dorsalis pedis pulses bilaterally Musculoskeletal: No deformity or edema  Neurologic: Upper and lower extremity motor 5/5 and symmetric  DATA:  Ultrasound of the aorta dated debris 20th 2017 is reviewed. This shows aneurysm of 4.8 x 4.7 cm in diameter left common iliac is 2.2 cm right common iliac is normal  ASSESSMENT:  Pathophysiology of aortic aneurysms was discussed with the patient today. I also discussed with him signs and symptoms of impending aneurysm rupture including back or abdominal pain. He will report to emergency room if he ever experiences either these. Otherwise advised him that the risk of rupture for aneurysm less than 5 and half sent meters of diameter is fairly low less than 1% per year. However, this can grow over time. He will follow-up with [place] with a repeat ultrasound in 6 months time. If the aneurysm grows to 5 5/2 cm in diameter we would consider repair at that point.   PLAN:  Follow-up ultrasound all aortic aneurysm and see me in 6 months   [name], MD [facility] Office: [phone] Pager: [phone]

## 2016-01-15 NOTE — Addendum Note (Signed)
Addended by: Amado Coe on: 01/15/2016 04:16 PM   Modules accepted: Orders

## 2016-01-29 ENCOUNTER — Encounter: Payer: Self-pay | Admitting: Cardiology

## 2016-01-29 ENCOUNTER — Ambulatory Visit (INDEPENDENT_AMBULATORY_CARE_PROVIDER_SITE_OTHER): Payer: BLUE CROSS/BLUE SHIELD | Admitting: Cardiology

## 2016-01-29 VITALS — BP 138/84 | HR 55 | Ht 70.0 in | Wt 200.0 lb

## 2016-01-29 DIAGNOSIS — I10 Essential (primary) hypertension: Secondary | ICD-10-CM

## 2016-01-29 DIAGNOSIS — I5022 Chronic systolic (congestive) heart failure: Secondary | ICD-10-CM | POA: Diagnosis not present

## 2016-01-29 DIAGNOSIS — I2510 Atherosclerotic heart disease of native coronary artery without angina pectoris: Secondary | ICD-10-CM | POA: Diagnosis not present

## 2016-01-29 NOTE — Patient Instructions (Signed)

## 2016-01-29 NOTE — Progress Notes (Addendum)
Patient ID: Nicolas Butler, male   DOB: 1951/01/10, 65 y.o.   MRN: [id number]     Clinical Summary Mr. Brislin is a 65 y.o.male seen today for follow up of the following medical problems.   1.HTN  - checks at home daily. Typically around 110s/70s - compliant with meds  2.Chronic Systolic Heart failure  - echo showed LVEF 40-45%, mod LVH, abnormal but ungraded diastolic function, reported akinesis of the basal anterolateral, mid-anterolateral, and apical lateral wall segments.  - lexiscan MPI showed moderate sized interoapical and inferolateral infarct with mild peri-infarct ischemia.   - denies any SOB, no DOE, no LE edema since last visit.  - compliant with meds.  3. Presumed CAD - based on echo and MPI results, MPI with suggestion of prior infarcts with only mild peri-infarct ischemia. This has been medically managed.   - denies any chest pain since last visit  4. HL - 10/2015 TC 174 TG 114 HDL 31 LDL  129 - muscle aches on lipitor. Did not tolerate pravastatin. Started on crestor just last week.    5. AAA - followed by vascular  6. Former tobacco - quit 2014. Roughly 45 year history.  - no coughing or wheezing. No significant SOB.   SH: retired from [employer] 2 years ago. Grandkids x2, he has 2 children. Daughter works as Marine scientist at [employer].     Past Medical History  Diagnosis Date  . Unspecified essential hypertension   . AAA (abdominal aortic aneurysm) ([facility])      Allergies  Allergen Reactions  . Hydrochlorothiazide Other (See Comments)    Pt states "it made my legs hurt"     Current Outpatient Prescriptions  Medication Sig Dispense Refill  . amLODipine (NORVASC) 5 MG tablet Take 1 tablet (5 mg total) by mouth daily. 30 tablet 4  . aspirin EC 81 MG tablet Take 1 tablet (81 mg total) by mouth daily.    Marland Kitchen atorvastatin (LIPITOR) 80 MG tablet Take 1 tablet (80 mg total) by mouth daily. 30 tablet 3  . carvedilol (COREG) 3.125 MG tablet Take 1 tablet  (3.125 mg total) by mouth 2 (two) times daily. 60 tablet 6  . hydrochlorothiazide (MICROZIDE) 12.5 MG capsule Take 1 capsule (12.5 mg total) by mouth daily. (Patient not taking: Reported on 12/19/2015) 30 capsule 6  . lisinopril (PRINIVIL,ZESTRIL) 40 MG tablet Take 1 tablet (40 mg total) by mouth daily. 30 tablet 6  . NITROSTAT 0.4 MG SL tablet Place 1 tablet under the tongue every 5 (five) minutes x 3 doses as needed.     No current facility-administered medications for this visit.     Past Surgical History  Procedure Laterality Date  . Hand surgery Left   . Umbilical hernia repair    . Incisional hernia repair      LEFT  . Irrigation and debridement sebaceous cyst      FROM RIGHT SHOULDER     Allergies  Allergen Reactions  . Hydrochlorothiazide Other (See Comments)    Pt states "it made my legs hurt"      Family History  Problem Relation Age of Onset  . Diabetes Son   . Pneumonia Mother   . Other Father     brain tumor     Social History Mr. Mitton reports that he quit smoking about 3 years ago. His smoking use included Cigarettes. He started smoking about 50 years ago. He quit after 46 years of use. He has never used smokeless tobacco.  Mr. Wuebker reports that he does not drink alcohol.   Review of Systems CONSTITUTIONAL: No weight loss, fever, chills, weakness or fatigue.  HEENT: Eyes: No visual loss, blurred vision, double vision or yellow sclerae.No hearing loss, sneezing, congestion, runny nose or sore throat.  SKIN: No rash or itching.  CARDIOVASCULAR: per HPI RESPIRATORY: No shortness of breath, cough or sputum.  GASTROINTESTINAL: No anorexia, nausea, vomiting or diarrhea. No abdominal pain or blood.  GENITOURINARY: No burning on urination, no polyuria NEUROLOGICAL: No headache, dizziness, syncope, paralysis, ataxia, numbness or tingling in the extremities. No change in bowel or bladder control.  MUSCULOSKELETAL: No muscle, back pain, joint pain or stiffness.   LYMPHATICS: No enlarged nodes. No history of splenectomy.  PSYCHIATRIC: No history of depression or anxiety.  ENDOCRINOLOGIC: No reports of sweating, cold or heat intolerance. No polyuria or polydipsia.  Marland Kitchen   Physical Examination Filed Vitals:   01/29/16 1024  BP: 138/84  Pulse: 55   Filed Vitals:   01/29/16 1024  Height: '5\' 10"'$  (1.778 m)  Weight: 200 lb (90.719 kg)    Gen: resting comfortably, no acute distress HEENT: no scleral icterus, pupils equal round and reactive, no palptable cervical adenopathy,  CV: RRR, no m/r/g, no jvd Resp: Clear to auscultation bilaterally GI: abdomen is soft, non-tender, non-distended, normal bowel sounds, no hepatosplenomegaly MSK: extremities are warm, no edema.  Skin: warm, no rash Neuro:  no focal deficits Psych: appropriate affect   Diagnostic Studies 02/2014 MPI  Raw images showed appropriate radiotracer uptake. There is a  moderate-sized mildly reversible inferoapical and inferolateral wall  defect. There are no other myocardial perfusion defects. There is  mild inferolateral wall hypokinesis.  Gated imaging shows end-diastolic volume 299 mL, and systolic volume  90 mL, left ventricular ejection fraction 40%.  IMPRESSION:  1. Abnormal Lexiscan MPI  2. 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  3. Decreased left ventricular systolic function, LVEF 37%  4. Increased study for major cardiac events due to decreased LV  systolic function, there is fairly mild myocardium at jeopardy   02/2014 Echo LVEF 40-45%, abnormal diastolic function. Multiple WMAs,     Assessment and Plan  1. HTN  - at goal, we will conitnue current meds  2. Chronic systolic heart failure  - new diagnosis from admission 02/2014, LVEF 40-45% with wall motion abnormalities, NYHA II  - MPI shows evidence of possible old infarct, likely a component of ICM, though could be mixed  with a HTN CM as well.  - limited beta blocker titration due to low resting heart rates.  - no aldactone at this time due to borderline renal function  - we will continue current meds  3. Presumed CAD  - MPI and echo with evidence of old infarct, no current chest pain. Only mild peri-infarct ischemia that is asymptomatic, no indication for invasive testing at this time. He also has borderline renal function and is at increased risk for cath.  - EKG in clinic without ischemic changes - continue medical therapy    F/u 6 months   Arnoldo Lenis, M.D.

## 2016-06-24 DIAGNOSIS — E782 Mixed hyperlipidemia: Secondary | ICD-10-CM | POA: Diagnosis not present

## 2016-06-24 DIAGNOSIS — I714 Abdominal aortic aneurysm, without rupture: Secondary | ICD-10-CM | POA: Diagnosis not present

## 2016-06-24 DIAGNOSIS — I10 Essential (primary) hypertension: Secondary | ICD-10-CM | POA: Diagnosis not present

## 2016-06-24 DIAGNOSIS — Z6829 Body mass index (BMI) 29.0-29.9, adult: Secondary | ICD-10-CM | POA: Diagnosis not present

## 2016-07-08 ENCOUNTER — Encounter: Payer: Self-pay | Admitting: Family

## 2016-07-09 ENCOUNTER — Encounter: Payer: Self-pay | Admitting: Family

## 2016-07-09 ENCOUNTER — Ambulatory Visit (HOSPITAL_COMMUNITY)
Admission: RE | Admit: 2016-07-09 | Discharge: 2016-07-09 | Disposition: A | Discharge status: Home / Self Care | Payer: PPO | Source: Ambulatory Visit | Attending: Family | Admitting: Family

## 2016-07-09 ENCOUNTER — Ambulatory Visit (INDEPENDENT_AMBULATORY_CARE_PROVIDER_SITE_OTHER): Payer: PPO | Admitting: Family

## 2016-07-09 VITALS — BP 137/86 | HR 64 | Temp 97.5°F | Ht 70.0 in | Wt 197.6 lb

## 2016-07-09 DIAGNOSIS — I723 Aneurysm of iliac artery: Secondary | ICD-10-CM | POA: Diagnosis not present

## 2016-07-09 DIAGNOSIS — Z87891 Personal history of nicotine dependence: Secondary | ICD-10-CM

## 2016-07-09 DIAGNOSIS — I714 Abdominal aortic aneurysm, without rupture, unspecified: Secondary | ICD-10-CM

## 2016-07-09 DIAGNOSIS — I739 Peripheral vascular disease, unspecified: Secondary | ICD-10-CM | POA: Diagnosis not present

## 2016-07-09 NOTE — Patient Instructions (Addendum)
Before your next abdominal ultrasound:  Take two Extra-Strength Gas-X capsules at bedtime the night before the test. Take another two Extra-Strength Gas-X capsules 3 hours before the test.      Abdominal Aortic Aneurysm An aneurysm is a weakened or damaged part of an artery wall that bulges from the normal force of blood pumping through the body. An abdominal aortic aneurysm is an aneurysm that occurs in the lower part of the aorta, the main artery of the body.  The major concern with an abdominal aortic aneurysm is that it can enlarge and burst (rupture) or blood can flow between the layers of the wall of the aorta through a tear (aorticdissection). Both of these conditions can cause bleeding inside the body and can be life threatening unless diagnosed and treated promptly. CAUSES  The exact cause of an abdominal aortic aneurysm is unknown. Some contributing factors are:   A hardening of the arteries caused by the buildup of fat and other substances in the lining of a blood vessel (arteriosclerosis).  Inflammation of the walls of an artery (arteritis).   Connective tissue diseases, such as Marfan syndrome.   Abdominal trauma.   An infection, such as syphilis or staphylococcus, in the wall of the aorta (infectious aortitis) caused by bacteria. RISK FACTORS  Risk factors that contribute to an abdominal aortic aneurysm may include:  Age older than 15 years.   High blood pressure (hypertension).  Male gender.  Ethnicity (white race).  Obesity.  Family history of aneurysm (first degree relatives only).  Tobacco use. PREVENTION  The following healthy lifestyle habits may help decrease your risk of abdominal aortic aneurysm:  Quitting smoking. Smoking can raise your blood pressure and cause arteriosclerosis.  Limiting or avoiding alcohol.  Keeping your blood pressure, blood sugar level, and cholesterol levels within normal limits.  Decreasing your salt intake. In  somepeople, too much salt can raise blood pressure and increase your risk of abdominal aortic aneurysm.  Eating a diet low in saturated fats and cholesterol.  Increasing your fiber intake by including whole grains, vegetables, and fruits in your diet. Eating these foods may help lower blood pressure.  Maintaining a healthy weight.  Staying physically active and exercising regularly. SYMPTOMS  The symptoms of abdominal aortic aneurysm may vary depending on the size and rate of growth of the aneurysm.Most grow slowly and do not have any symptoms. When symptoms do occur, they may include:  Pain (abdomen, side, lower back, or groin). The pain may vary in intensity. A sudden onset of severe pain may indicate that the aneurysm has ruptured.  Feeling full after eating only small amounts of food.  Nausea or vomiting or both.  Feeling a pulsating lump in the abdomen.  Feeling faint or passing out. DIAGNOSIS  Since most unruptured abdominal aortic aneurysms have no symptoms, they are often discovered during diagnostic exams for other conditions. An aneurysm may be found during the following procedures:  Ultrasonography (A one-time screening for abdominal aortic aneurysm by ultrasonography is also recommended for all men aged 49-75 years who have ever smoked).  X-ray exams.  A computed tomography (CT).  Magnetic resonance imaging (MRI).  Angiography or arteriography. TREATMENT  Treatment of an abdominal aortic aneurysm depends on the size of your aneurysm, your age, and risk factors for rupture. Medication to control blood pressure and pain may be used to manage aneurysms smaller than 6 cm. Regular monitoring for enlargement may be recommended by your caregiver if:  The aneurysm is 3-4  cm in size (an annual ultrasonography may be recommended).  The aneurysm is 4-4.5 cm in size (an ultrasonography every 6 months may be recommended).  The aneurysm is larger than 4.5 cm in size (your  caregiver may ask that you be examined by a vascular surgeon). If your aneurysm is larger than 6 cm, surgical repair may be recommended. There are two main methods for repair of an aneurysm:   Endovascular repair (a minimally invasive surgery). This is done most often.  Open repair. This method is used if an endovascular repair is not possible.   This information is not intended to replace advice given to you by your health care provider. Make sure you discuss any questions you have with your health care provider.   Document Released: 06/03/2005 Document Revised: 12/19/2012 Document Reviewed: 09/23/2012 Elsevier Interactive Patient Education [employer].

## 2016-07-09 NOTE — Progress Notes (Signed)
[facility] [place]   CC: Follow up Abdominal Aortic Aneurysm  History of Present Illness  Nicolas Butler is a 65 y.o. (February 28, 1951) malewho Dr. Oneida Alar saw on initial evaluation on 12/19/15 for asymptomatic abdominal aortic aneurysm.  The patient's aneurysm was discovered on an ultrasound looking at possible sources of elevated creatinine. This was found to be 4.8 cm on 10/28/2015, left common iliac artery was 2.2 cm, right common iliac artery was normal diameter.   The patient denies any family history of aneurysms. Chronic medical problems include hypertension and hyperlipidemia both of which are been stable. He also has some cardiac dysfunction and states his last ejection fraction was 40%. He is on aspirin and a statin. He also has some underlying lung problems with shortness of breath with minimal activity.   The patient denies back or abdominal pain.  The patient denies claudication in legs with walking. He had to quit taking a statin due to severe myalgias.  The patient denies history of stroke or TIA symptoms.  Pt Diabetic: Yes Pt smoker: former smoker, started about age 55, quit in 2014  Past Medical History:  Diagnosis Date  . AAA (abdominal aortic aneurysm) ([place])   . Unspecified essential hypertension    Past Surgical History:  Procedure Laterality Date  . HAND SURGERY Left   . INCISIONAL HERNIA REPAIR     LEFT  . IRRIGATION AND DEBRIDEMENT SEBACEOUS CYST     FROM RIGHT SHOULDER  . UMBILICAL HERNIA REPAIR     Social History Social History   Social History  . Marital status: Legally Separated    Spouse name: N/A  . Number of children: N/A  . Years of education: N/A   Occupational History  . Not on file.   Social History Main Topics  . Smoking status: Former Smoker    Years: 46.00    Types: Cigarettes    Start date: 05/17/1965    Quit date: 12/07/2012  . Smokeless tobacco: Never Used  . Alcohol use No  . Drug use: No  . Sexual activity:  Not on file   Other Topics Concern  . Not on file   Social History Narrative  . No narrative on file   Family History Family History  Problem Relation Age of Onset  . Diabetes Son   . Pneumonia Mother   . Other Father     brain tumor    Current Outpatient Prescriptions on File Prior to Visit  Medication Sig Dispense Refill  . amLODipine (NORVASC) 10 MG tablet Take 10 mg by mouth daily.  3  . aspirin EC 81 MG tablet Take 1 tablet (81 mg total) by mouth daily.    . carvedilol (COREG) 3.125 MG tablet Take 1 tablet (3.125 mg total) by mouth 2 (two) times daily. 60 tablet 6  . lisinopril (PRINIVIL,ZESTRIL) 40 MG tablet Take 1 tablet (40 mg total) by mouth daily. 30 tablet 6  . NITROSTAT 0.4 MG SL tablet Place 1 tablet under the tongue every 5 (five) minutes x 3 doses as needed.     No current facility-administered medications on file prior to visit.    Allergies  Allergen Reactions  . Hydrochlorothiazide Other (See Comments)    Pt states "it made my legs hurt"  . Atorvastatin Other (See Comments)    Muscle pain  . Pravastatin Other (See Comments)    Muscle pain    ROS: See HPI for pertinent positives and negatives.  Physical Examination  Vitals:  07/09/16 0927  BP: 137/86  Pulse: 64  Temp: 97.5 F (36.4 C)  TempSrc: Oral  SpO2: 96%  Weight: 197 lb 9.6 oz (89.6 kg)  Height: '5\' 10"'$  (1.778 m)   Body mass index is 28.35 kg/m.  General: A&O x 3, WD, male.  Pulmonary: Sym exp, respirations are non labored, good air movt, CTAB, no rales, rhonchi, or wheezing.  Cardiac: RRR, Nl S1, S2, no detected murmur.   Carotid Bruits Right Left   Negative Negative   Abdominal aortic pulse is mildly palpable on expiration Radial pulses are 2+ palpable and =                          VASCULAR EXAM:                                                                                                         LE Pulses Right Left       FEMORAL  3+ palpable  3+ palpable         POPLITEAL  2+ palpable   2+ palpable       POSTERIOR TIBIAL  2+ palpable   2+ palpable        DORSALIS PEDIS      ANTERIOR TIBIAL 2+ palpable  2+ palpable      Gastrointestinal: soft, NTND, -G/R, - HSM, - masses palpated, - CVAT B.  Musculoskeletal: M/S 5/5 throughout, Extremities without ischemic changes.  Neurologic: CN 2-12 intact, Pain and light touch intact in extremities are intact, Motor exam as listed above.  Non-Invasive Vascular Imaging  AAA Duplex (07/09/2016)  Previous size: 4.8 cm (Date: February 2017, outside exam), left common iliac artery was 2.2 cm, right common iliac artery was normal diameter.   Current size:  4.82 cm (Date: 07/09/16), bilateral common iliac arteries not visualized due to overlying bowel gas. Areas of limited visualization.   Medical Decision Making  The patient is a 65 y.o. male who presents with asymptomatic AAA with no increase in size, based on limited visualization due to overlying bowel gas; bilateral common iliac arteries not visualized.   Based on this patient's exam and diagnostic studies, the patient will follow up in 6 months  with the following studies: AAA duplex.  Consideration for repair of AAA would be made when the size is 5.0 cm, growth > 1 cm/yr, and symptomatic status.  I emphasized the importance of maximal medical management including strict control of blood pressure, blood glucose, and lipid levels, antiplatelet agents, obtaining regular exercise, and continued cessation of smoking.   The patient was given information about AAA including signs, symptoms, treatment, and how to minimize the risk of enlargement and rupture of aneurysms.    The patient was advised to call 911 should the patient experience sudden onset abdominal or back pain.   Thank you for allowing [place] to participate in this patient's care.  Clemon Chambers, RN, MSN, FNP-C [facility] [place] Office: [facility]  Physician: Oneida Alar  07/09/2016, 9:44 AM

## 2016-07-28 NOTE — Addendum Note (Signed)
Addended by: Lianne Cure A on: 07/28/2016 09:26 AM   Modules accepted: Orders

## 2016-08-03 ENCOUNTER — Encounter: Payer: Self-pay | Admitting: *Deleted

## 2016-08-04 ENCOUNTER — Encounter: Payer: Self-pay | Admitting: *Deleted

## 2016-08-04 ENCOUNTER — Encounter: Payer: Self-pay | Admitting: Cardiology

## 2016-08-04 ENCOUNTER — Ambulatory Visit (INDEPENDENT_AMBULATORY_CARE_PROVIDER_SITE_OTHER): Payer: PPO | Admitting: Cardiology

## 2016-08-04 VITALS — BP 137/90 | HR 56 | Ht 70.0 in | Wt 198.2 lb

## 2016-08-04 DIAGNOSIS — E782 Mixed hyperlipidemia: Secondary | ICD-10-CM

## 2016-08-04 DIAGNOSIS — N183 Chronic kidney disease, stage 3 unspecified: Secondary | ICD-10-CM

## 2016-08-04 DIAGNOSIS — I2510 Atherosclerotic heart disease of native coronary artery without angina pectoris: Secondary | ICD-10-CM | POA: Diagnosis not present

## 2016-08-04 DIAGNOSIS — I10 Essential (primary) hypertension: Secondary | ICD-10-CM

## 2016-08-04 NOTE — Progress Notes (Signed)
Clinical Summary Mr. Broker is a 65 y.o.male seen today for follow up of the following medical problems.   1.HTN  - checks at home daily. Typically around 110s/70-80s - takes meds daily.   2.Chronic Systolic Heart failure  - echo showed LVEF 40-45%, mod LVH, abnormal but ungraded diastolic function, reported akinesis of the basal anterolateral, mid-anterolateral, and apical lateral wall segments.  - lexiscan MPI showed moderate sized interoapical and inferolateral infarct with mild peri-infarct ischemia.    - exercise with exercise bike every morning, 10-15 minutes without troubles' - no recent SOB/DOE, no LE edema - compliant with meds. Medical therapy limited by bradycardia and CKD.   3. Presumed CAD - based on echo and MPI results, MPI with suggestion of prior infarcts with only mild peri-infarct ischemia. This has been medically managed.   - denies any chest pain or SOB since last visit. Riding bike daily without symptoms.   4. HL - 10/2015 TC 174 TG 114 HDL 31 LDL  129 - muscle aches on lipitor and crestor. Did not tolerate pravastatin.  - currently diet controlled   5. AAA - followed by vascular  6. CKD III - followed by nephrology   SH: retired from [employer] 2 years ago. Grandkids x2, he has 2 children. Daughter works as Marine scientist at [employer] History:  Diagnosis Date  . AAA (abdominal aortic aneurysm) ([place])   . Unspecified essential hypertension      Allergies  Allergen Reactions  . Hydrochlorothiazide Other (See Comments)    Pt states "it made my legs hurt"  . Atorvastatin Other (See Comments)    Muscle pain  . Pravastatin Other (See Comments)    Muscle pain     Current Outpatient Prescriptions  Medication Sig Dispense Refill  . amLODipine (NORVASC) 10 MG tablet Take 10 mg by mouth daily.  3  . aspirin EC 81 MG tablet Take 1 tablet (81 mg total) by mouth daily.    . carvedilol (COREG) 3.125 MG tablet Take 1 tablet  (3.125 mg total) by mouth 2 (two) times daily. 60 tablet 6  . lisinopril (PRINIVIL,ZESTRIL) 40 MG tablet Take 1 tablet (40 mg total) by mouth daily. 30 tablet 6  . NITROSTAT 0.4 MG SL tablet Place 1 tablet under the tongue every 5 (five) minutes x 3 doses as needed.     No current facility-administered medications for this visit.      Past Surgical History:  Procedure Laterality Date  . HAND SURGERY Left   . INCISIONAL HERNIA REPAIR     LEFT  . IRRIGATION AND DEBRIDEMENT SEBACEOUS CYST     FROM RIGHT SHOULDER  . UMBILICAL HERNIA REPAIR       Allergies  Allergen Reactions  . Hydrochlorothiazide Other (See Comments)    Pt states "it made my legs hurt"  . Atorvastatin Other (See Comments)    Muscle pain  . Pravastatin Other (See Comments)    Muscle pain      Family History  Problem Relation Age of Onset  . Pneumonia Mother   . Other Father     brain tumor  . Diabetes Son      Social History Mr. Buss reports that he quit smoking about 3 years ago. His smoking use included Cigarettes. He started smoking about 51 years ago. He quit after 46.00 years of use. He has never used smokeless tobacco. Mr. Bidinger reports that he does not drink alcohol.   Review of Systems  CONSTITUTIONAL: No weight loss, fever, chills, weakness or fatigue.  HEENT: Eyes: No visual loss, blurred vision, double vision or yellow sclerae.No hearing loss, sneezing, congestion, runny nose or sore throat.  SKIN: No rash or itching.  CARDIOVASCULAR: per HPI RESPIRATORY: No shortness of breath, cough or sputum.  GASTROINTESTINAL: No anorexia, nausea, vomiting or diarrhea. No abdominal pain or blood.  GENITOURINARY: No burning on urination, no polyuria NEUROLOGICAL: No headache, dizziness, syncope, paralysis, ataxia, numbness or tingling in the extremities. No change in bowel or bladder control.  MUSCULOSKELETAL: No muscle, back pain, joint pain or stiffness.  LYMPHATICS: No enlarged nodes. No history  of splenectomy.  PSYCHIATRIC: No history of depression or anxiety.  ENDOCRINOLOGIC: No reports of sweating, cold or heat intolerance. No polyuria or polydipsia.  Marland Kitchen   Physical Examination Vitals:   08/04/16 0845  BP: 137/90  Pulse: (!) 56   Vitals:   08/04/16 0845  Weight: 198 lb 3.2 oz (89.9 kg)  Height: '5\' 10"'$  (1.778 m)    Gen: resting comfortably, no acute distress HEENT: no scleral icterus, pupils equal round and reactive, no palptable cervical adenopathy,  CV: RRR, no m/r/g, no jvd Resp: Clear to auscultation bilaterally GI: abdomen is soft, non-tender, non-distended, normal bowel sounds, no hepatosplenomegaly MSK: extremities are warm, no edema.  Skin: warm, no rash Neuro:  no focal deficits Psych: appropriate affect   Diagnostic Studies 02/2014 MPI  Raw images showed appropriate radiotracer uptake. There is a  moderate-sized mildly reversible inferoapical and inferolateral wall  defect. There are no other myocardial perfusion defects. There is  mild inferolateral wall hypokinesis.  Gated imaging shows end-diastolic volume 017 mL, and systolic volume  90 mL, left ventricular ejection fraction 40%.  IMPRESSION:  1. Abnormal Lexiscan MPI  2. 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  3. Decreased left ventricular systolic function, LVEF 49%  4. Increased study for major cardiac events due to decreased LV  systolic function, there is fairly mild myocardium at jeopardy   02/2014 Echo LVEF 40-45%, abnormal diastolic function. Multiple WMAs,     Assessment and Plan   1. HTN  - he is at goal based on home numbers, continue current meds  2. Chronic systolic heart failure  - new diagnosis from admission 02/2014, LVEF 40-45% with wall motion abnormalities, NYHA II  - MPI shows evidence of possible old infarct, likely a component of ICM, though could be mixed with a HTN CM as well.   - limited beta blocker titration due to low resting heart rates.  - no aldactone at this time due to borderline renal function  - no current symptoms. We will continue current meds  3. Presumed CAD  - MPI and echo with evidence of old infarct, no current chest pain. Only mild peri-infarct ischemia that is asymptomatic, no indication for invasive testing at this time. He also has borderline renal function and is at increased risk for cath.  - no recent symptoms, continue current meds  4. CKD III - followed by nephrology - continue ACE-I  5. AAA  - per vascular  6. Hyperlipidemia - intolerant to statins. Continue dietary modifications, can consider trial of zetia of pcsk-9 inhibitor in the future pending cholesterol trends.    F/u 6 months     Arnoldo Lenis, M.D.

## 2016-08-04 NOTE — Patient Instructions (Signed)

## 2016-08-11 DIAGNOSIS — I714 Abdominal aortic aneurysm, without rupture: Secondary | ICD-10-CM | POA: Diagnosis not present

## 2016-08-11 DIAGNOSIS — I10 Essential (primary) hypertension: Secondary | ICD-10-CM | POA: Diagnosis not present

## 2016-10-20 DIAGNOSIS — E782 Mixed hyperlipidemia: Secondary | ICD-10-CM | POA: Diagnosis not present

## 2016-10-20 DIAGNOSIS — I714 Abdominal aortic aneurysm, without rupture: Secondary | ICD-10-CM | POA: Diagnosis not present

## 2016-10-20 DIAGNOSIS — I10 Essential (primary) hypertension: Secondary | ICD-10-CM | POA: Diagnosis not present

## 2016-10-23 DIAGNOSIS — Z6829 Body mass index (BMI) 29.0-29.9, adult: Secondary | ICD-10-CM | POA: Diagnosis not present

## 2016-10-23 DIAGNOSIS — Z1389 Encounter for screening for other disorder: Secondary | ICD-10-CM | POA: Diagnosis not present

## 2016-10-23 DIAGNOSIS — I10 Essential (primary) hypertension: Secondary | ICD-10-CM | POA: Diagnosis not present

## 2016-10-23 DIAGNOSIS — I714 Abdominal aortic aneurysm, without rupture: Secondary | ICD-10-CM | POA: Diagnosis not present

## 2016-10-23 DIAGNOSIS — E782 Mixed hyperlipidemia: Secondary | ICD-10-CM | POA: Diagnosis not present

## 2016-12-31 DIAGNOSIS — R300 Dysuria: Secondary | ICD-10-CM | POA: Diagnosis not present

## 2016-12-31 DIAGNOSIS — Z6830 Body mass index (BMI) 30.0-30.9, adult: Secondary | ICD-10-CM | POA: Diagnosis not present

## 2016-12-31 DIAGNOSIS — N410 Acute prostatitis: Secondary | ICD-10-CM | POA: Diagnosis not present

## 2017-01-05 ENCOUNTER — Encounter: Payer: Self-pay | Admitting: Family

## 2017-01-14 ENCOUNTER — Ambulatory Visit (HOSPITAL_COMMUNITY)
Admission: RE | Admit: 2017-01-14 | Discharge: 2017-01-14 | Disposition: A | Discharge status: Home / Self Care | Payer: PPO | Source: Ambulatory Visit | Attending: Family | Admitting: Family

## 2017-01-14 ENCOUNTER — Encounter: Payer: Self-pay | Admitting: Family

## 2017-01-14 ENCOUNTER — Ambulatory Visit (INDEPENDENT_AMBULATORY_CARE_PROVIDER_SITE_OTHER): Payer: PPO | Admitting: Family

## 2017-01-14 VITALS — BP 133/85 | HR 55 | Temp 98.4°F | Resp 18 | Ht 70.0 in | Wt 199.5 lb

## 2017-01-14 DIAGNOSIS — Z87891 Personal history of nicotine dependence: Secondary | ICD-10-CM | POA: Diagnosis not present

## 2017-01-14 DIAGNOSIS — I723 Aneurysm of iliac artery: Secondary | ICD-10-CM

## 2017-01-14 DIAGNOSIS — I714 Abdominal aortic aneurysm, without rupture, unspecified: Secondary | ICD-10-CM

## 2017-01-14 NOTE — Progress Notes (Addendum)
[facility] [place]   CC: Follow up Abdominal Aortic Aneurysm  History of Present Illness  Nicolas Butler is a 66 y.o. ([birth year]) male whom Dr. Oneida Alar initially evaluated on 12/19/15 for asymptomatic abdominal aortic aneurysm.  The patient's aneurysm was discovered on an ultrasound looking at possible sources of elevated creatinine. This was found to be 4.8 cm on 10/28/2015, left common iliac artery was 2.2 cm, right common iliac artery was normal diameter.   The patient denies any family history of aneurysms. Chronic medical problems include hypertension and hyperlipidemia both of which are been stable. He also has some cardiac dysfunction and states his last ejection fraction was 40%. He is on aspirin and a statin. He also has some underlying lung problems with shortness of breath with minimal activity.   The patient denies back or abdominal pain.  The patient denies claudication in legs with walking. He had to quit taking a statin due to severe myalgias; since he resumed atorvastatin at a lower dosage, myalgias have returned but nit as severe.  The patient denies history of stroke or TIA symptoms.  Pt Diabetic: no Pt smoker: former smoker, started about age 61, quit in 2014   Past Medical History:  Diagnosis Date  . AAA (abdominal aortic aneurysm) ([place])   . Unspecified essential hypertension    Past Surgical History:  Procedure Laterality Date  . HAND SURGERY Left   . INCISIONAL HERNIA REPAIR     LEFT  . IRRIGATION AND DEBRIDEMENT SEBACEOUS CYST     FROM RIGHT SHOULDER  . UMBILICAL HERNIA REPAIR     Social History Social History   Social History  . Marital status: Legally Separated    Spouse name: N/A  . Number of children: N/A  . Years of education: N/A   Occupational History  . Not on file.   Social History Main Topics  . Smoking status: Former Smoker    Years: 46.00    Types: Cigarettes    Start date: 05/17/1965    Quit date: 12/07/2012   . Smokeless tobacco: Never Used  . Alcohol use No  . Drug use: No  . Sexual activity: Not on file   Other Topics Concern  . Not on file   Social History Narrative  . No narrative on file   Family History Family History  Problem Relation Age of Onset  . Pneumonia Mother   . Other Father        brain tumor  . Diabetes Son     Current Outpatient Prescriptions on File Prior to Visit  Medication Sig Dispense Refill  . amLODipine (NORVASC) 10 MG tablet Take 10 mg by mouth daily.  3  . aspirin EC 81 MG tablet Take 1 tablet (81 mg total) by mouth daily.    . carvedilol (COREG) 3.125 MG tablet Take 1 tablet (3.125 mg total) by mouth 2 (two) times daily. 60 tablet 6  . lisinopril (PRINIVIL,ZESTRIL) 40 MG tablet Take 1 tablet (40 mg total) by mouth daily. 30 tablet 6  . NITROSTAT 0.4 MG SL tablet Place 1 tablet under the tongue every 5 (five) minutes x 3 doses as needed.     No current facility-administered medications on file prior to visit.    Allergies  Allergen Reactions  . Hydrochlorothiazide Other (See Comments)    Pt states "it made my legs hurt"  . Atorvastatin Other (See Comments)    Muscle pain  . Pravastatin Other (See Comments)  Muscle pain    ROS: See HPI for pertinent positives and negatives.  Physical Examination  Vitals:   01/14/17 0840  BP: 133/85  Pulse: (!) 55  Resp: 18  Temp: 98.4 F (36.9 C)  TempSrc: Oral  SpO2: 96%  Weight: 199 lb 8 oz (90.5 kg)  Height: '5\' 10"'$  (1.778 m)   Body mass index is 28.63 kg/m.  General: A&O x 3, WD, male.  Pulmonary: Sym exp, respirations are non labored, good air movt, CTAB, no rales, rhonchi, or wheezing.  Cardiac: Regular rhythm, bradycardic (taking a beta blocker), no detected murmur.   Carotid Bruits Right Left   Negative Negative   Abdominal aortic pulse is not palpable. Radial pulses are 2+ palpable and =                          VASCULAR EXAM:                                                                                                                                            LE Pulses Right Left       FEMORAL  3+ palpable  3+ palpable        POPLITEAL  2+ palpable   not palpable       POSTERIOR TIBIAL  2+ palpable   2+ palpable        DORSALIS PEDIS      ANTERIOR TIBIAL 2+ palpable  2+ palpable      Gastrointestinal: soft, NTND, -G/R, - HSM, - masses palpated, - CVAT B.  Musculoskeletal: M/S 5/5 throughout, Extremities without ischemic changes.  Neurologic: CN 2-12 intact, Pain and light touch intact in extremities are intact, Motor exam as listed above.     Non-Invasive Vascular Imaging  AAA Duplex (01/14/2017)  Previous size:4.82 cm (Date: 07/09/16), bilateral common iliac arteries not visualized due to overlying bowel gas. Areas of limited visualization.  Current size:  5.23 cm (Date: 01/14/17), Right CIA: 1.78 cm; Left CIA: not visualized.  Medical Decision Making  The patient is a 66 y.o. male who presents with asymptomatic AAA with increase in size to 5.23 cm, based on limited visualization. Last serum creatinine result on file was 1.4 from his PCP's office, collected on 06-24-16.    Based on this patient's exam and diagnostic studies, and after discussing with Dr. Oneida Alar, the patient will be scheduled for a non contrast CTA abd/pelvis to evaluate his enlarging asymptomatic AAA.   Consideration for repair of AAA would be made when the size is 5.0 cm, growth > 1 cm/yr, and symptomatic status.  I emphasized the importance of maximal medical management including strict control of blood pressure, blood glucose, and lipid levels, antiplatelet agents, obtaining regular exercise, and continued cessation of smoking.   The patient was given information about AAA including signs, symptoms, treatment, and how to  minimize the risk of enlargement and rupture of aneurysms.    The patient was advised to call 911 should the patient experience sudden  onset abdominal or back pain.   Thank you for allowing [place] to participate in this patient's care.  Clemon Chambers, RN, MSN, FNP-C [facility] [place] Office: [facility] Physician: Oneida Alar  01/14/2017, 8:46 AM

## 2017-01-14 NOTE — Patient Instructions (Signed)
Abdominal Aortic Aneurysm Blood pumps away from the heart through tubes (blood vessels) called arteries. Aneurysms are weak or damaged places in the wall of an artery. It bulges out like a balloon. An abdominal aortic aneurysm happens in the main artery of the body (aorta). It can burst or tear, causing bleeding inside the body. This is an emergency. It needs treatment right away. What are the causes? The exact cause is unknown. Things that could cause this problem include:  Fat and other substances building up in the lining of a tube.  Swelling of the walls of a blood vessel.  Certain tissue diseases.  Belly (abdominal) trauma.  An infection in the main artery of the body.  What increases the risk? There are things that make it more likely for you to have an aneurysm. These include:  Being over the age of 66 years old.  Having high blood pressure (hypertension).  Being a male.  Being white.  Being very overweight (obese).  Having a family history of aneurysm.  Using tobacco products.  What are the signs or symptoms? Symptoms depend on the size of the aneurysm and how fast it grows. There may not be symptoms. If symptoms occur, they can include:  Pain (belly, side, lower back, or groin).  Feeling full after eating a small amount of food.  Feeling sick to your stomach (nauseous), throwing up (vomiting), or both.  Feeling a lump in your belly that feels like it is beating (pulsating).  Feeling like you will pass out (faint).  How is this treated?  Medicine to control blood pressure and pain.  Imaging tests to see if the aneurysm gets bigger.  Surgery. How is this prevented? To lessen your chance of getting this condition:  Stop smoking. Stop chewing tobacco.  Limit or avoid alcohol.  Keep your blood pressure, blood sugar, and cholesterol within normal limits.  Eat less salt.  Eat foods low in saturated fats and cholesterol. These are found in animal and  whole dairy products.  Eat more fiber. Fiber is found in whole grains, vegetables, and fruits.  Keep a healthy weight.  Stay active and exercise often.  This information is not intended to replace advice given to you by your health care provider. Make sure you discuss any questions you have with your health care provider. Document Released: 12/19/2012 Document Revised: 01/30/2016 Document Reviewed: 09/23/2012 Elsevier Interactive Patient Education  2017 [employer]  

## 2017-01-20 NOTE — Addendum Note (Signed)
Addended by: Lianne Cure A on: 01/20/2017 03:13 PM   Modules accepted: Orders

## 2017-01-27 ENCOUNTER — Encounter: Payer: Self-pay | Admitting: Cardiology

## 2017-01-27 ENCOUNTER — Ambulatory Visit (INDEPENDENT_AMBULATORY_CARE_PROVIDER_SITE_OTHER): Payer: PPO | Admitting: Cardiology

## 2017-01-27 ENCOUNTER — Telehealth: Payer: Self-pay | Admitting: Vascular Surgery

## 2017-01-27 VITALS — BP 131/78 | HR 65 | Ht 69.0 in | Wt 198.8 lb

## 2017-01-27 DIAGNOSIS — I2510 Atherosclerotic heart disease of native coronary artery without angina pectoris: Secondary | ICD-10-CM | POA: Diagnosis not present

## 2017-01-27 DIAGNOSIS — I5022 Chronic systolic (congestive) heart failure: Secondary | ICD-10-CM

## 2017-01-27 DIAGNOSIS — I10 Essential (primary) hypertension: Secondary | ICD-10-CM

## 2017-01-27 NOTE — Progress Notes (Signed)
Clinical Summary Nicolas Butler is a 66 y.o.male seen today for follow up of the following medical problems.   1.HTN  - checks at home daily. Typically around 110s/70-80s - takes meds daily.   - checks at home daily, usually around 110s/70s - compliant with meds. Off HCTZ due to leg cramps.   2.Chronic Systolic Heart failure  - echo showed LVEF 40-45%, mod LVH, abnormal but ungraded diastolic function, reported akinesis of the basal anterolateral, mid-anterolateral, and apical lateral wall segments.  - lexiscan MPI showed moderate sized interoapical and inferolateral infarct with mild peri-infarct ischemia.    - no recent SOB/DOE, no LE edema   3. Presumed CAD - based on echo and MPI results, MPI with suggestion of prior infarcts with only mild peri-infarct ischemia. This has been medically managed.   - denies any chest pain since last visit  4. HL - 10/2015 TC 174 TG 114 HDL 31 LDL 129 - back on lipitor, tolerating without troubles. - 06/2016 TC 174 TG 141 HDL 29 LDL 117   5. AAA - followed by vascular  6. CKD III - followed by nephrology, baseline 1.4-1.6   SH: retired from [employer] 2 years ago. Grandkids x2 one is 14 and one is 5, he has 2 children. Daughter works as Marine scientist at [employer] History:  Diagnosis Date  . AAA (abdominal aortic aneurysm) ([place])   . Unspecified essential hypertension      Allergies  Allergen Reactions  . Hydrochlorothiazide Other (See Comments)    Pt states "it made my legs hurt"  . Atorvastatin Other (See Comments)    Muscle pain  . Pravastatin Other (See Comments)    Muscle pain     Current Outpatient Prescriptions  Medication Sig Dispense Refill  . amLODipine (NORVASC) 10 MG tablet Take 10 mg by mouth daily.  3  . aspirin EC 81 MG tablet Take 1 tablet (81 mg total) by mouth daily.    . ATORVASTATIN CALCIUM PO Take by mouth.    . carvedilol (COREG) 3.125 MG tablet Take 1 tablet (3.125 mg  total) by mouth 2 (two) times daily. 60 tablet 6  . lisinopril (PRINIVIL,ZESTRIL) 40 MG tablet Take 1 tablet (40 mg total) by mouth daily. 30 tablet 6  . NITROSTAT 0.4 MG SL tablet Place 1 tablet under the tongue every 5 (five) minutes x 3 doses as needed.     No current facility-administered medications for this visit.      Past Surgical History:  Procedure Laterality Date  . HAND SURGERY Left   . INCISIONAL HERNIA REPAIR     LEFT  . IRRIGATION AND DEBRIDEMENT SEBACEOUS CYST     FROM RIGHT SHOULDER  . UMBILICAL HERNIA REPAIR       Allergies  Allergen Reactions  . Hydrochlorothiazide Other (See Comments)    Pt states "it made my legs hurt"  . Atorvastatin Other (See Comments)    Muscle pain  . Pravastatin Other (See Comments)    Muscle pain      Family History  Problem Relation Age of Onset  . Pneumonia Mother   . Other Father        brain tumor  . Diabetes Son      Social History Nicolas Butler reports that he quit smoking about 4 years ago. His smoking use included Cigarettes. He started smoking about 51 years ago. He quit after 46.00 years of use. He has never used smokeless tobacco. Mr.  Butler reports that he does not drink alcohol.   Review of Systems CONSTITUTIONAL: No weight loss, fever, chills, weakness or fatigue.  HEENT: Eyes: No visual loss, blurred vision, double vision or yellow sclerae.No hearing loss, sneezing, congestion, runny nose or sore throat.  SKIN: No rash or itching.  CARDIOVASCULAR: per hpi RESPIRATORY: No shortness of breath, cough or sputum.  GASTROINTESTINAL: No anorexia, nausea, vomiting or diarrhea. No abdominal pain or blood.  GENITOURINARY: No burning on urination, no polyuria NEUROLOGICAL: No headache, dizziness, syncope, paralysis, ataxia, numbness or tingling in the extremities. No change in bowel or bladder control.  MUSCULOSKELETAL: No muscle, back pain, joint pain or stiffness.  LYMPHATICS: No enlarged nodes. No history of  splenectomy.  PSYCHIATRIC: No history of depression or anxiety.  ENDOCRINOLOGIC: No reports of sweating, cold or heat intolerance. No polyuria or polydipsia.  Marland Kitchen   Physical Examination Vitals:   01/27/17 1040  BP: 131/78  Pulse: 65   Vitals:   01/27/17 1040  Weight: 198 lb 12.8 oz (90.2 kg)  Height: 5\' 9"  (1.753 m)    Gen: resting comfortably, no acute distress HEENT: no scleral icterus, pupils equal round and reactive, no palptable cervical adenopathy,  CV: RRR, no m/r/g, no jvd Resp: Clear to auscultation bilaterally GI: abdomen is soft, non-tender, non-distended, normal bowel sounds, no hepatosplenomegaly MSK: extremities are warm, no edema.  Skin: warm, no rash Neuro:  no focal deficits Psych: appropriate affect   Diagnostic Studies 02/2014 MPI Raw images showed appropriate radiotracer uptake. There is a  moderate-sized mildly reversible inferoapical and inferolateral wall  defect. There are no other myocardial perfusion defects. There is  mild inferolateral wall hypokinesis.  Gated imaging shows end-diastolic volume 702 mL, and systolic volume  90 mL, left ventricular ejection fraction 40%.  IMPRESSION:  1. Abnormal Lexiscan MPI  2. 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  3. Decreased left ventricular systolic function, LVEF 63%  4. Increased study for major cardiac events due to decreased LV  systolic function, there is fairly mild myocardium at jeopardy   02/2014 Echo LVEF 40-45%, abnormal diastolic function. Multiple WMAs,    Assessment and Plan  1. HTN  - he is at goal, continue current meds  2. Chronic systolic heart failure  - new diagnosis from admission 02/2014, LVEF 40-45% with wall motion abnormalities, NYHA II  - MPI shows evidence of possible old infarct, likely a component of ICM, though could be mixed with a HTN CM as well.  - limited beta blocker  titration due to low resting heart rates.  - no aldactone at this time due to borderline renal function  - we will continue current meds at this time  3. Presumed CAD  - MPI and echo with evidence of old infarct, no current chest pain. Only mild peri-infarct ischemia that is asymptomatic, no indication for invasive testing at this time. He also has borderline renal function and is at increased risk for cath.   - no recent symptoms, continue to monitor at this time  4. CKD III - followed by nephrology   5. AAA  - per vascular  6. Hyperlipidemia - intolerant to statins. Continue dietary modification - request labs from pcp         Arnoldo Lenis, M.D.

## 2017-01-27 NOTE — Telephone Encounter (Signed)
Sched CTA at [facility] 02/10/17 at 3:45. Spoke to pt to confirm appt.

## 2017-01-27 NOTE — Telephone Encounter (Signed)
-----   Message from Margy Clarks, LPN sent at 09/12/2692  9:56 AM EDT ----- Regarding: CTA  His CTA has been precerted, please schedule this  Thanks, Happy Dorris Fetch

## 2017-01-27 NOTE — Patient Instructions (Signed)

## 2017-02-03 ENCOUNTER — Other Ambulatory Visit: Payer: Self-pay | Admitting: Family

## 2017-02-04 NOTE — Addendum Note (Signed)
Addended by: Lianne Cure A on: 02/04/2017 10:15 AM   Modules accepted: Orders

## 2017-02-10 ENCOUNTER — Ambulatory Visit (HOSPITAL_COMMUNITY)
Admission: RE | Admit: 2017-02-10 | Discharge: 2017-02-10 | Disposition: A | Discharge status: Home / Self Care | Payer: PPO | Source: Ambulatory Visit | Attending: Family | Admitting: Family

## 2017-02-10 ENCOUNTER — Ambulatory Visit (HOSPITAL_COMMUNITY): Payer: PPO

## 2017-02-10 DIAGNOSIS — I722 Aneurysm of renal artery: Secondary | ICD-10-CM | POA: Insufficient documentation

## 2017-02-10 DIAGNOSIS — I714 Abdominal aortic aneurysm, without rupture, unspecified: Secondary | ICD-10-CM

## 2017-02-10 DIAGNOSIS — D3501 Benign neoplasm of right adrenal gland: Secondary | ICD-10-CM | POA: Diagnosis not present

## 2017-02-10 DIAGNOSIS — I723 Aneurysm of iliac artery: Secondary | ICD-10-CM | POA: Insufficient documentation

## 2017-02-17 ENCOUNTER — Telehealth: Payer: Self-pay | Admitting: Vascular Surgery

## 2017-02-17 NOTE — Telephone Encounter (Signed)
-----   Message from Viann Fish, NP sent at 02/16/2017  5:18 PM EDT ----- Regarding: RE: [facility] appt July 4 the office is closed, but as soon as possible. Thanks, Vinnie Level  ----- Message ----- From: Georgiann Mccoy Sent: 02/16/2017   4:23 PM To: Sharmon Leyden Nickel, NP Subject: [facility] appt                                       Right now with vacations the soonest is 03/10/17

## 2017-02-17 NOTE — Telephone Encounter (Signed)
Pt needed to come in next avail to discuss results of CT. Sched appt 03/18/17 at 9:45. Lm on hm# for pt to confirm appt.

## 2017-02-19 DIAGNOSIS — E559 Vitamin D deficiency, unspecified: Secondary | ICD-10-CM | POA: Diagnosis not present

## 2017-02-19 DIAGNOSIS — I10 Essential (primary) hypertension: Secondary | ICD-10-CM | POA: Diagnosis not present

## 2017-02-19 DIAGNOSIS — E782 Mixed hyperlipidemia: Secondary | ICD-10-CM | POA: Diagnosis not present

## 2017-02-25 DIAGNOSIS — I714 Abdominal aortic aneurysm, without rupture: Secondary | ICD-10-CM | POA: Diagnosis not present

## 2017-02-25 DIAGNOSIS — Z6829 Body mass index (BMI) 29.0-29.9, adult: Secondary | ICD-10-CM | POA: Diagnosis not present

## 2017-02-25 DIAGNOSIS — N183 Chronic kidney disease, stage 3 (moderate): Secondary | ICD-10-CM | POA: Diagnosis not present

## 2017-02-25 DIAGNOSIS — E782 Mixed hyperlipidemia: Secondary | ICD-10-CM | POA: Diagnosis not present

## 2017-02-25 DIAGNOSIS — I10 Essential (primary) hypertension: Secondary | ICD-10-CM | POA: Diagnosis not present

## 2017-03-08 ENCOUNTER — Encounter: Payer: Self-pay | Admitting: Vascular Surgery

## 2017-03-18 ENCOUNTER — Encounter: Payer: Self-pay | Admitting: Vascular Surgery

## 2017-03-18 ENCOUNTER — Ambulatory Visit (INDEPENDENT_AMBULATORY_CARE_PROVIDER_SITE_OTHER): Payer: PPO | Admitting: Vascular Surgery

## 2017-03-18 VITALS — BP 116/74 | HR 55 | Temp 98.2°F | Resp 16 | Ht 69.0 in | Wt 201.0 lb

## 2017-03-18 DIAGNOSIS — I714 Abdominal aortic aneurysm, without rupture, unspecified: Secondary | ICD-10-CM

## 2017-03-18 DIAGNOSIS — Z01812 Encounter for preprocedural laboratory examination: Secondary | ICD-10-CM

## 2017-03-18 NOTE — Progress Notes (Signed)
Patient name: Nicolas Butler MRN: [id number] DOB: 1950-09-10 Sex: male   HPI: Nicolas Butler is a 66 y.o. male with known abdominal aortic aneurysm who returns today for follow-up after recent CT scan of abdomen and pelvis. Other medical problems include CK D3 with stable renal function. However, his CT scan was obtained without contrast due to this. He denies abdominal or back pain. He has no claudication symptoms. He has no history of TIA amaurosis stroke or recent cardiac symptoms. He has had a positive stress test in 2015. Ejection fraction at that time was 40%. He is on aspirin and a statin. Abdominal aortic aneurysm was 4.8 cm in November 2017. It was 5.2 cm recently on ultrasound.  Past Medical History:  Diagnosis Date  . AAA (abdominal aortic aneurysm) ([place])   . Unspecified essential hypertension    Past Surgical History:  Procedure Laterality Date  . HAND SURGERY Left   . INCISIONAL HERNIA REPAIR     LEFT  . IRRIGATION AND DEBRIDEMENT SEBACEOUS CYST     FROM RIGHT SHOULDER  . UMBILICAL HERNIA REPAIR      Family History  Problem Relation Age of Onset  . Pneumonia Mother   . Other Father        brain tumor  . Diabetes Son     SOCIAL HISTORY: Social History   Social History  . Marital status: Legally Separated    Spouse name: N/A  . Number of children: N/A  . Years of education: N/A   Occupational History  . Not on file.   Social History Main Topics  . Smoking status: Former Smoker    Years: 46.00    Types: Cigarettes    Start date: 05/17/1965    Quit date: 12/07/2012  . Smokeless tobacco: Never Used  . Alcohol use No  . Drug use: No  . Sexual activity: Not on file   Other Topics Concern  . Not on file   Social History Narrative  . No narrative on file    Allergies  Allergen Reactions  . Hydrochlorothiazide Other (See Comments)    Pt states "it made my legs hurt"  . Atorvastatin Other (See Comments)    Muscle pain  . Pravastatin Other (See  Comments)    Muscle pain    Current Outpatient Prescriptions  Medication Sig Dispense Refill  . amLODipine (NORVASC) 10 MG tablet Take 10 mg by mouth daily.  3  . aspirin EC 81 MG tablet Take 1 tablet (81 mg total) by mouth daily.    . ATORVASTATIN CALCIUM PO Take 40 mg by mouth. Take one tablet by mouth every 4th day    . carvedilol (COREG) 3.125 MG tablet Take 1 tablet (3.125 mg total) by mouth 2 (two) times daily. 60 tablet 6  . lisinopril (PRINIVIL,ZESTRIL) 40 MG tablet Take 1 tablet (40 mg total) by mouth daily. 30 tablet 6  . NITROSTAT 0.4 MG SL tablet Place 1 tablet under the tongue every 5 (five) minutes x 3 doses as needed.     No current facility-administered medications for this visit.     ROS:   General:  No weight loss, Fever, chills  HEENT: No recent headaches, no nasal bleeding, no visual changes, no sore throat  Neurologic: No dizziness, blackouts, seizures. No recent symptoms of stroke or mini- stroke. No recent episodes of slurred speech, or temporary blindness.  Cardiac: No recent episodes of chest pain/pressure, no shortness of breath at rest.  +shortness  of breath with exertion.  Denies history of atrial fibrillation or irregular heartbeat  Vascular: No history of rest pain in feet.  No history of claudication.  No history of non-healing ulcer, No history of DVT   Pulmonary: No home oxygen, no productive cough, no hemoptysis,  No asthma or wheezing  Musculoskeletal:  [ ]  Arthritis, [ ]  Low back pain,  [ ]  Joint pain  Hematologic:No history of hypercoagulable state.  No history of easy bleeding.  No history of anemia  Gastrointestinal: No hematochezia or melena,  No gastroesophageal reflux, no trouble swallowing  Urinary: [X]  chronic Kidney disease, [ ]  on HD - [ ]  MWF or [ ]  TTHS, [ ]  Burning with urination, [ ]  Frequent urination, [ ]  Difficulty urinating;   Skin: No rashes  Psychological: No history of anxiety,  No history of depression   Physical  Examination  Vitals:   03/18/17 0947  BP: 116/74  Pulse: (!) 55  Resp: 16  Temp: 98.2 F (36.8 C)  TempSrc: Oral  SpO2: 94%  Weight: 201 lb (91.2 kg)  Height: 5\' 9"  (1.753 m)    Body mass index is 29.68 kg/m.  General:  Alert and oriented, no acute distress HEENT: Normal Neck: No bruit or JVD Pulmonary: Clear to auscultation bilaterally Cardiac: Regular Rate and Rhythm without murmur Abdomen: Soft, non-tender, non-distended, no mass, Periumbilical scar  Skin: No rash Extremity Pulses:  2+ radial, brachial, femoral, dorsalis pedis, posterior tibial pulses bilaterally Musculoskeletal: No deformity or edema  Neurologic: Upper and lower extremity motor 5/5 and symmetric  DATA:  I reviewed the patient's CT images today. This shows a 5.4 cm juxtarenal abdominal aortic aneurysm. He also has an ectatic right common iliac which is 2.3 cm in diameter. The aortic neck is 3.4 cm. 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 There is mild renal atrophy.  ASSESSMENT:  Enlarging asymptomatic infrarenal abdominal aortic aneurysm. The neck of the aneurysm is too large to consider stent graft repair. She will require open repair. Risks benefits possible complications and procedure details were discussed the patient today including but limited to bleeding 20% risk of transfusion, infection 1%, renal dysfunction possibly requiring dialysis 2-5%. Myocardial events 5%. He understands and agrees to proceed.   PLAN: #1 Will have [facility] reviewed his cardiac status and repeat his stress test to see if he has reasonable for her open abdominal aortic aneurysm repair. If his cardiac stress test suggests that he is not a very good candidate for open repair we could possibly consider him for a fenestrated graft. Other option would be continued surveillance if he has prohibitive risk and waiting until his aneurysm reaches 5-1/2-6 cm in diameter. However, the aneurysm does appear  to be growing and I think we will probably crosses threshold fairly soon.   [name], MD [facility] Office: [phone] Pager: [phone]

## 2017-03-22 NOTE — Addendum Note (Signed)
Addended by: Lianne Cure A on: 03/22/2017 09:22 AM   Modules accepted: Orders

## 2017-03-23 ENCOUNTER — Telehealth: Payer: Self-pay | Admitting: Vascular Surgery

## 2017-03-23 NOTE — Telephone Encounter (Signed)
I left a VM for this patient regarding the pre-operative Lexiscan stress test which I scheduled for Friday 03/26/17 at 7:45am. He needs to arrive at 7:30am. No caffeine 12 hours prior and NPO 3 hours prior. Medications as usual.  [address]  [phone] awt

## 2017-03-24 ENCOUNTER — Telehealth (HOSPITAL_COMMUNITY): Payer: Self-pay | Admitting: *Deleted

## 2017-03-24 NOTE — Telephone Encounter (Signed)
Patient given detailed instructions per Myocardial Perfusion Study Information Sheet for the test on  03/26/17. Patient notified to arrive 15 minutes early and that it is imperative to arrive on time for appointment to keep from having the test rescheduled.  If you need to cancel or reschedule your appointment, please call the office within 24 hours of your appointment. . Patient verbalized understanding. Kirstie Peri

## 2017-03-26 ENCOUNTER — Ambulatory Visit (HOSPITAL_COMMUNITY): Payer: PPO | Attending: Cardiovascular Disease

## 2017-03-26 DIAGNOSIS — R9439 Abnormal result of other cardiovascular function study: Secondary | ICD-10-CM | POA: Insufficient documentation

## 2017-03-26 DIAGNOSIS — I2510 Atherosclerotic heart disease of native coronary artery without angina pectoris: Secondary | ICD-10-CM | POA: Insufficient documentation

## 2017-03-26 DIAGNOSIS — R0602 Shortness of breath: Secondary | ICD-10-CM | POA: Insufficient documentation

## 2017-03-26 DIAGNOSIS — N189 Chronic kidney disease, unspecified: Secondary | ICD-10-CM | POA: Insufficient documentation

## 2017-03-26 DIAGNOSIS — Z01812 Encounter for preprocedural laboratory examination: Secondary | ICD-10-CM | POA: Diagnosis not present

## 2017-03-26 DIAGNOSIS — I129 Hypertensive chronic kidney disease with stage 1 through stage 4 chronic kidney disease, or unspecified chronic kidney disease: Secondary | ICD-10-CM | POA: Diagnosis not present

## 2017-03-26 DIAGNOSIS — I714 Abdominal aortic aneurysm, without rupture: Secondary | ICD-10-CM | POA: Insufficient documentation

## 2017-03-26 DIAGNOSIS — Z01818 Encounter for other preprocedural examination: Secondary | ICD-10-CM | POA: Diagnosis not present

## 2017-03-26 LAB — MYOCARDIAL PERFUSION IMAGING
CHL CUP NUCLEAR SSS: 11
CSEPPHR: 74 {beats}/min
LHR: 0.26
LV sys vol: 41 mL
LVDIAVOL: 107 mL (ref 62–150)
Rest HR: 51 {beats}/min
SDS: 6
SRS: 5
TID: 0.93

## 2017-03-26 MED ORDER — REGADENOSON 0.4 MG/5ML IV SOLN
0.4000 mg | Freq: Once | INTRAVENOUS | Status: AC
  Administered 2017-03-26: 0.4 mg via INTRAVENOUS

## 2017-03-26 MED ORDER — TECHNETIUM TC 99M TETROFOSMIN IV KIT
10.2000 | PACK | Freq: Once | INTRAVENOUS | Status: AC | PRN
  Administered 2017-03-26: 10.2 via INTRAVENOUS
  Filled 2017-03-26: qty 11

## 2017-03-26 MED ORDER — TECHNETIUM TC 99M TETROFOSMIN IV KIT
31.9000 | PACK | Freq: Once | INTRAVENOUS | Status: AC | PRN
  Administered 2017-03-26: 31.9 via INTRAVENOUS
  Filled 2017-03-26: qty 32

## 2017-04-19 ENCOUNTER — Telehealth: Payer: Self-pay | Admitting: Cardiology

## 2017-04-19 NOTE — Telephone Encounter (Signed)
Left message to return call 

## 2017-04-19 NOTE — Telephone Encounter (Signed)
Please let patient know I reviewed his stress test results. I believe Dr Oneida Alar had ordered the test which was completed in [place], and thus I did not receive a notification of its completion. There is some evidence of a possible blockage in one of the arteries of the heart. We need to discuss in detail in the context of his possible surgery, can he see me Wed Aug 22 at [facility]

## 2017-04-19 NOTE — Telephone Encounter (Signed)
Patient notified.  OV scheduled as requested.

## 2017-04-19 NOTE — Telephone Encounter (Signed)
Mr. Pfiester called stating that Dr. Oneida Alar had contacted Dr. Harl Bowie about having a Stress test.  Patient was uncertain as to who is suppose to order. Patient needs to have vascular surgery.

## 2017-04-19 NOTE — Telephone Encounter (Signed)
Dr. Harl Bowie please advise.

## 2017-04-28 ENCOUNTER — Encounter: Payer: Self-pay | Admitting: Cardiology

## 2017-04-28 ENCOUNTER — Ambulatory Visit: Payer: PPO | Admitting: Cardiology

## 2017-04-28 ENCOUNTER — Ambulatory Visit (INDEPENDENT_AMBULATORY_CARE_PROVIDER_SITE_OTHER): Payer: PPO | Admitting: Cardiology

## 2017-04-28 ENCOUNTER — Telehealth: Payer: Self-pay | Admitting: Cardiology

## 2017-04-28 VITALS — BP 118/78 | HR 64 | Ht 69.0 in | Wt 198.0 lb

## 2017-04-28 DIAGNOSIS — I2510 Atherosclerotic heart disease of native coronary artery without angina pectoris: Secondary | ICD-10-CM

## 2017-04-28 DIAGNOSIS — R0602 Shortness of breath: Secondary | ICD-10-CM

## 2017-04-28 DIAGNOSIS — R9439 Abnormal result of other cardiovascular function study: Secondary | ICD-10-CM | POA: Diagnosis not present

## 2017-04-28 DIAGNOSIS — I5022 Chronic systolic (congestive) heart failure: Secondary | ICD-10-CM

## 2017-04-28 NOTE — Progress Notes (Addendum)
Clinical Summary Nicolas Butler is a 66 y.o.male seen today for follow up of the following medical problems.This is a focused visit on his history of presumed CAD and recent abnormal stress test.    1.Chronic Systolic Heart failure  - echo showed LVEF 40-45%, mod LVH, abnormal but ungraded diastolic function, reported akinesis of the basal anterolateral, mid-anterolateral, and apical lateral wall segments.  - lexiscan MPI showed moderate sized interoapical and inferolateral infarct with mild peri-infarct ischemia.    - no recent SOB/DOE, no LE edema. He remains compliant with meds   2 Presumed CAD - based on echo and MPI results, MPI with suggestion of prior infarcts with only mild peri-infarct ischemia. This has been medically managed.    - 03/2017 Lexiscan with inferior/inferoapical defect with mild to moderate peri-infarct ischemia.  - denies any chest pain. Uses push mower x 30 minutes. Reports can walk up 2 flights of stairs without limitation. Can walk up 500 yards mild incline to his sisters house without troubles. - no recent edema.   - can walk up 4 flights without significant diffculty.    3. AAA/Preoperative evaluation - followed by vascular, being considered for open repair      SH: retired from [employer] 2 years ago. Grandkids x2 one is 14 and one is 5, he has 2 children. Daughter works as Marine scientist at [employer] History:  Diagnosis Date  . AAA (abdominal aortic aneurysm) ([place])   . Unspecified essential hypertension      Allergies  Allergen Reactions  . Hydrochlorothiazide Other (See Comments)    Pt states "it made my legs hurt"  . Atorvastatin Other (See Comments)    Muscle pain  . Pravastatin Other (See Comments)    Muscle pain     Current Outpatient Prescriptions  Medication Sig Dispense Refill  . amLODipine (NORVASC) 10 MG tablet Take 10 mg by mouth daily.  3  . aspirin EC 81 MG tablet Take 1 tablet (81 mg total) by  mouth daily.    . ATORVASTATIN CALCIUM PO Take 40 mg by mouth. Take one tablet by mouth every 4th day    . carvedilol (COREG) 3.125 MG tablet Take 1 tablet (3.125 mg total) by mouth 2 (two) times daily. 60 tablet 6  . lisinopril (PRINIVIL,ZESTRIL) 40 MG tablet Take 1 tablet (40 mg total) by mouth daily. 30 tablet 6  . NITROSTAT 0.4 MG SL tablet Place 1 tablet under the tongue every 5 (five) minutes x 3 doses as needed.     No current facility-administered medications for this visit.      Past Surgical History:  Procedure Laterality Date  . HAND SURGERY Left   . INCISIONAL HERNIA REPAIR     LEFT  . IRRIGATION AND DEBRIDEMENT SEBACEOUS CYST     FROM RIGHT SHOULDER  . UMBILICAL HERNIA REPAIR       Allergies  Allergen Reactions  . Hydrochlorothiazide Other (See Comments)    Pt states "it made my legs hurt"  . Atorvastatin Other (See Comments)    Muscle pain  . Pravastatin Other (See Comments)    Muscle pain      Family History  Problem Relation Age of Onset  . Pneumonia Mother   . Other Father        brain tumor  . Diabetes Son      Social History Nicolas Butler reports that he quit smoking about 4 years ago. His smoking use included Cigarettes. He  started smoking about 51 years ago. He quit after 46.00 years of use. He has never used smokeless tobacco. Nicolas Butler reports that he does not drink alcohol.   Review of Systems CONSTITUTIONAL: No weight loss, fever, chills, weakness or fatigue.  HEENT: Eyes: No visual loss, blurred vision, double vision or yellow sclerae.No hearing loss, sneezing, congestion, runny nose or sore throat.  SKIN: No rash or itching.  CARDIOVASCULAR: per hpi RESPIRATORY: per hpi GASTROINTESTINAL: No anorexia, nausea, vomiting or diarrhea. No abdominal pain or blood.  GENITOURINARY: No burning on urination, no polyuria NEUROLOGICAL: No headache, dizziness, syncope, paralysis, ataxia, numbness or tingling in the extremities. No change in bowel or  bladder control.  MUSCULOSKELETAL: No muscle, back pain, joint pain or stiffness.  LYMPHATICS: No enlarged nodes. No history of splenectomy.  PSYCHIATRIC: No history of depression or anxiety.  ENDOCRINOLOGIC: No reports of sweating, cold or heat intolerance. No polyuria or polydipsia.  Marland Kitchen   Physical Examination Vitals:   04/28/17 1519  BP: 118/78  Pulse: 64  SpO2: 96%   Vitals:   04/28/17 1519  Weight: 198 lb (89.8 kg)  Height: 5\' 9"  (1.753 m)    Gen: resting comfortably, no acute distress HEENT: no scleral icterus, pupils equal round and reactive, no palptable cervical adenopathy,  CV: RRR, no mr/g, no jvd Resp: Clear to auscultation bilaterally GI: abdomen is soft, non-tender, non-distended, normal bowel sounds, no hepatosplenomegaly MSK: extremities are warm, no edema.  Skin: warm, no rash Neuro:  no focal deficits Psych: appropriate affect   Diagnostic Studies 02/2014 MPI Raw images showed appropriate radiotracer uptake. There is a  moderate-sized mildly reversible inferoapical and inferolateral wall  defect. There are no other myocardial perfusion defects. There is  mild inferolateral wall hypokinesis.  Gated imaging shows end-diastolic volume 735 mL, and systolic volume  90 mL, left ventricular ejection fraction 40%.  IMPRESSION:  1. Abnormal Lexiscan MPI  2. 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  3. Decreased left ventricular systolic function, LVEF 32%  4. Increased study for major cardiac events due to decreased LV  systolic function, there is fairly mild myocardium at jeopardy   02/2014 Echo LVEF 40-45%, abnormal diastolic function. Multiple WMAs,  03/2017 MPI  Nuclear stress EF: 61%.  There was no ST segment deviation noted during stress.  Defect 1: There is a defect present in the basal inferior, basal inferolateral, mid inferior, mid inferolateral and apical  inferior location.  Findings consistent with ischemia.  This is an intermediate risk study.  The left ventricular ejection fraction is normal (55-65%).   There is a large area, moderate severity defect in the basal and mid inferolateral, inferior and apical inferior walls (SDS=6) consistent with ischemia in the LCX territory.    Assessment and Plan    1. Chronic systolic heart failure  - MPI shows evidence of possible old infarct, likely a component of ICM, though could be mixed with a HTN CM as well.  - limited beta blocker titration due to low resting heart rates.  - no aldactone at this time due to borderline renal function  - we will repeat echo in setting of possible upcoming vascular surgery and recent abnormal nuclear stress test  2. Presumed CAD  - prior nuclear stress tests including a recent test have shown evidence of prior infarct with peri-infarct ischemia - this has been managed medically in the absence of symptoms combined with his CKD - most recent stress test  mild to moderate peri-infarct ishcemia, intermediate risk - f/u repeat echo. He remains asymptomatic, continue medical therapy, no indication for invasive testing at this time.   3. Preoperative evaluation - patient is being considered for open AAA repair - recent intermediate risk nuclear stress test. He has had no cardiopulmonary symptoms. I personally monitored him walk up 4 flights of stairs in clinic today. He tolerates well over 4 METs without limitations - we will repeat echo. If stable or improved LVEF would not plan for any invasive cardiac testing prior to surgery. In the absence of any significant cardiac symptoms a cath is not indicated in the setting of his abnormal stress, in general PCI is not recommended prophylactically and has not been shown beneficial solely to get through a surgery.    F/u pending test results   Arnoldo Lenis, M.D.  05/03/17 addendum Echo shows LVEF has  improved and normalized to 60-65%. In summary he had an intermediate risk nuclear stress test with normal LVEF by echo, and tolerates well over 4 METs without difficultly and in general has not had any cardiopulmonary symptoms. There is no indication for cath at this time, as in this setting there is no role for any form of prophylactic pci to get through surgery as data does not support this strategy. Recommend proceeding with surgery as planned, continue cardiac meds in periop period.   Carlyle Dolly MD

## 2017-04-28 NOTE — Telephone Encounter (Signed)
Pre-cert Verification for the following procedure   ECHO - scheduled for 04-29-17 at [facility]

## 2017-04-28 NOTE — Patient Instructions (Signed)
Your physician recommends that you schedule a follow-up appointment in: TO BE DETERMINED AFTER TESTING WITH DR [facility]  Your physician recommends that you continue on your current medications as directed. Please refer to the Current Medication list given to you today.  Your physician has requested that you have an echocardiogram. Echocardiography is a painless test that uses sound waves to create images of your heart. It provides your doctor with information about the size and shape of your heart and how well your heart's chambers and valves are working. This procedure takes approximately one hour. There are no restrictions for this procedure.  Thank you for choosing [facility]!!

## 2017-04-29 ENCOUNTER — Ambulatory Visit: Payer: PPO

## 2017-04-29 ENCOUNTER — Other Ambulatory Visit: Payer: Self-pay

## 2017-04-29 DIAGNOSIS — R0602 Shortness of breath: Secondary | ICD-10-CM

## 2017-05-05 ENCOUNTER — Telehealth: Payer: Self-pay | Admitting: Cardiology

## 2017-05-05 NOTE — Telephone Encounter (Signed)
Pt aware and voiced understanding - routed to Dr Oneida Alar and pcp

## 2017-05-05 NOTE — Telephone Encounter (Signed)
-----   Message from Arnoldo Lenis, MD sent at 05/03/2017  1:37 PM EDT ----- Echo looks good. His heart function has actually improved since we last checked in in 2015 and actually is back within the normal range. Given how good his echo looks and his complete lack of symptoms, I do not recommend any additioanl testing on his heart at this time and recommend proceeding with surgery as planned. Please forward my addended clinic note to his surgeon   Zandra Abts MD

## 2017-05-05 NOTE — Telephone Encounter (Signed)
Nicolas Butler is calling to get results of recent echo.

## 2017-05-13 ENCOUNTER — Telehealth: Payer: Self-pay

## 2017-05-13 NOTE — Telephone Encounter (Signed)
rec'd phone call from pt.  Reported he has been cleared by Cardiology, and would like to schedule surgery to repair his aneurysm.  Discussed with Dr. Oneida Alar.  Advised to schedule pt. For office visit to discuss/ plan for AAA repair.  Notified pt. Of office appt. For 06/10/17 @ 10:45 AM.  Durward Fortes will place on wait list, and will bring him in sooner.  Verb. Understanding; agreed.

## 2017-06-03 ENCOUNTER — Ambulatory Visit (INDEPENDENT_AMBULATORY_CARE_PROVIDER_SITE_OTHER): Payer: PPO | Admitting: Vascular Surgery

## 2017-06-03 ENCOUNTER — Encounter: Payer: Self-pay | Admitting: Vascular Surgery

## 2017-06-03 VITALS — BP 139/94 | HR 57 | Temp 97.6°F | Resp 20 | Ht 69.0 in | Wt 199.6 lb

## 2017-06-03 DIAGNOSIS — I714 Abdominal aortic aneurysm, without rupture, unspecified: Secondary | ICD-10-CM

## 2017-06-03 NOTE — Progress Notes (Signed)
Patient name: Nicolas Butler MRN: [id number] DOB: 1951-05-12 Sex: male  HPI: Nicolas Butler is a 66 y.o. male who returns today for follow-up and consideration of repair of abdominal aortic aneurysm. He was deemed not to be an infrarenal stent graft candidate due to the large diameter of the aortic neck. He has recently had a cardiac stress test which was negative. He denies any abdominal or back pain currently. He has had one previous abdominal operation for an umbilical hernia repair. He denies any claudication symptoms. His aneurysm was originally discovered in 2017 and was 4.8 cm in diameter at that time. He has no family history of abdominal aortic aneurysm. He is on aspirin and a statin. He does have shortness of breath with vigorous activity. He is a former smoker.   Past Medical History:  Diagnosis Date  . AAA (abdominal aortic aneurysm) ([place])   . Unspecified essential hypertension    Past Surgical History:  Procedure Laterality Date  . HAND SURGERY Left   . INCISIONAL HERNIA REPAIR     LEFT  . IRRIGATION AND DEBRIDEMENT SEBACEOUS CYST     FROM RIGHT SHOULDER  . UMBILICAL HERNIA REPAIR      Family History  Problem Relation Age of Onset  . Pneumonia Mother   . Other Father        brain tumor  . Diabetes Son     SOCIAL HISTORY: Social History   Social History  . Marital status: Legally Separated    Spouse name: N/A  . Number of children: N/A  . Years of education: N/A   Occupational History  . Not on file.   Social History Main Topics  . Smoking status: Former Smoker    Years: 46.00    Types: Cigarettes    Start date: 05/17/1965    Quit date: 12/07/2012  . Smokeless tobacco: Never Used  . Alcohol use No  . Drug use: No  . Sexual activity: Not on file   Other Topics Concern  . Not on file   Social History Narrative  . No narrative on file    Allergies  Allergen Reactions  . Hydrochlorothiazide Other (See Comments)    Pt states "it made my legs  hurt"  . Atorvastatin Other (See Comments)    Muscle pain  . Pravastatin Other (See Comments)    Muscle pain    Current Outpatient Prescriptions  Medication Sig Dispense Refill  . amLODipine (NORVASC) 10 MG tablet Take 10 mg by mouth daily.  3  . aspirin EC 81 MG tablet Take 1 tablet (81 mg total) by mouth daily.    . ATORVASTATIN CALCIUM PO Take 40 mg by mouth. Take one tablet by mouth every 4th day    . carvedilol (COREG) 3.125 MG tablet Take 1 tablet (3.125 mg total) by mouth 2 (two) times daily. 60 tablet 6  . lisinopril (PRINIVIL,ZESTRIL) 40 MG tablet Take 1 tablet (40 mg total) by mouth daily. 30 tablet 6  . NITROSTAT 0.4 MG SL tablet Place 1 tablet under the tongue every 5 (five) minutes x 3 doses as needed.     No current facility-administered medications for this visit.     ROS:   General:  No weight loss, Fever, chills  HEENT: No recent headaches, no nasal bleeding, no visual changes, no sore throat  Neurologic: No dizziness, blackouts, seizures. No recent symptoms of stroke or mini- stroke. No recent episodes of slurred speech, or temporary blindness.  Cardiac: No recent episodes of chest pain/pressure, no shortness of breath at rest.  No shortness of breath with exertion.  Denies history of atrial fibrillation or irregular heartbeat  Vascular: No history of rest pain in feet.  No history of claudication.  No history of non-healing ulcer, No history of DVT   Pulmonary: No home oxygen, no productive cough, no hemoptysis,  No asthma or wheezing  Musculoskeletal:  [ ]  Arthritis, [ ]  Low back pain,  [ ]  Joint pain  Hematologic:No history of hypercoagulable state.  No history of easy bleeding.  No history of anemia  Gastrointestinal: No hematochezia or melena,  No gastroesophageal reflux, no trouble swallowing  Urinary: [ ]  chronic Kidney disease, [ ]  on HD - [ ]  MWF or [ ]  TTHS, [ ]  Burning with urination, [ ]  Frequent urination, [ ]  Difficulty urinating;   Skin: No  rashes  Psychological: No history of anxiety,  No history of depression   Physical Examination  Vitals:   06/03/17 1526  BP: (!) 139/94  Pulse: (!) 57  Resp: 20  Temp: 97.6 F (36.4 C)  TempSrc: Oral  SpO2: 92%  Weight: 199 lb 9.6 oz (90.5 kg)  Height: 5\' 9"  (1.753 m)    Body mass index is 29.48 kg/m.  General:  Alert and oriented, no acute distress HEENT: Normal Neck: No bruit or JVD Pulmonary: Clear to auscultation bilaterally Cardiac: Regular Rate and Rhythm without murmur Abdomen: Soft, non-tender, non-distended, no mass, Well-healed periumbilical scar possible recurrent umbilical hernia Skin: No rash Extremity Pulses:  2+ radial, brachial, femoral, dorsalis pedis bilaterally, 2+ right posterior tibial pulse  absent left posterior tibial pulse Musculoskeletal: No deformity or edema  Neurologic: Upper and lower extremity motor 5/5 and symmetric  DATA:  I reviewed the patient's CT scan of the abdomen and pelvis from June 2018. This shows a 5.4 cm infrarenal abdominal aortic aneurysm. The right common iliac artery is 2.3 cm in diameter. 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  ASSESSMENT:  Abdominal aortic aneurysm 5.4 cm in diameter. Not a candidate for stent graft repair and will need open abdominal aortic aneurysm repair. Potentially would address the common iliac artery at the same time depending on how this looks in the operating room. I would not plan on addressing the right renal artery aneurysm as this is quite small.  PLAN:  Open abdominal aortic aneurysm repair scheduled for 06/28/2017. Risks benefits possible palpitations and procedure details were discussed the patient and his daughter today. These include but are not limited to bleeding infection transfusion risk of 20% death myocardial events. Understanding and agree to proceed.   [name], MD [facility] Office: [phone] Pager: [phone]

## 2017-06-10 ENCOUNTER — Ambulatory Visit: Payer: PPO | Admitting: Vascular Surgery

## 2017-06-10 ENCOUNTER — Telehealth: Payer: Self-pay | Admitting: *Deleted

## 2017-06-10 ENCOUNTER — Other Ambulatory Visit: Payer: Self-pay | Admitting: *Deleted

## 2017-06-10 NOTE — Telephone Encounter (Signed)
Called patient to give him pre-op bowel prep instructions. No solid foods past noon day prior then Mag Citrate and plenty of clear liquids till midnight to prevent dehydration. NPO past MN as directed except any medications as directed by [facility] PST. Reviewed items for Clear liquid diet. Patient verbalized understanding.

## 2017-06-24 ENCOUNTER — Encounter (HOSPITAL_COMMUNITY)
Admission: RE | Admit: 2017-06-24 | Discharge: 2017-06-24 | Disposition: A | Discharge status: Home / Self Care | Payer: PPO | Source: Ambulatory Visit | Attending: Vascular Surgery | Admitting: Vascular Surgery

## 2017-06-24 ENCOUNTER — Encounter (HOSPITAL_COMMUNITY): Payer: Self-pay

## 2017-06-24 DIAGNOSIS — Z01812 Encounter for preprocedural laboratory examination: Secondary | ICD-10-CM | POA: Insufficient documentation

## 2017-06-24 DIAGNOSIS — I110 Hypertensive heart disease with heart failure: Secondary | ICD-10-CM | POA: Diagnosis not present

## 2017-06-24 DIAGNOSIS — E7800 Pure hypercholesterolemia, unspecified: Secondary | ICD-10-CM | POA: Diagnosis not present

## 2017-06-24 DIAGNOSIS — I5022 Chronic systolic (congestive) heart failure: Secondary | ICD-10-CM | POA: Diagnosis not present

## 2017-06-24 DIAGNOSIS — I714 Abdominal aortic aneurysm, without rupture: Secondary | ICD-10-CM | POA: Insufficient documentation

## 2017-06-24 HISTORY — DX: Chronic kidney disease, stage 3 unspecified: N18.30

## 2017-06-24 HISTORY — DX: Chronic kidney disease, stage 3 (moderate): N18.3

## 2017-06-24 HISTORY — DX: Pure hypercholesterolemia, unspecified: E78.00

## 2017-06-24 LAB — COMPREHENSIVE METABOLIC PANEL
ALT: 21 U/L (ref 17–63)
ANION GAP: 8 (ref 5–15)
AST: 24 U/L (ref 15–41)
Albumin: 4.2 g/dL (ref 3.5–5.0)
Alkaline Phosphatase: 93 U/L (ref 38–126)
BILIRUBIN TOTAL: 0.6 mg/dL (ref 0.3–1.2)
BUN: 21 mg/dL — AB (ref 6–20)
CHLORIDE: 105 mmol/L (ref 101–111)
CO2: 21 mmol/L — ABNORMAL LOW (ref 22–32)
CREATININE: 1.54 mg/dL — AB (ref 0.61–1.24)
Calcium: 9.3 mg/dL (ref 8.9–10.3)
GFR, EST AFRICAN AMERICAN: 53 mL/min — AB (ref >60)
GFR, EST NON AFRICAN AMERICAN: 45 mL/min — AB (ref >60)
GLUCOSE: 95 mg/dL (ref 65–99)
Potassium: 4.3 mmol/L (ref 3.5–5.1)
Sodium: 134 mmol/L — ABNORMAL LOW (ref 135–145)
TOTAL PROTEIN: 7.8 g/dL (ref 6.5–8.1)

## 2017-06-24 LAB — PROTIME-INR
INR: 1.03
PROTHROMBIN TIME: 13.4 s (ref 11.4–15.2)

## 2017-06-24 LAB — BLOOD GAS, ARTERIAL
ACID-BASE EXCESS: 0.9 mmol/L (ref 0.0–2.0)
Bicarbonate: 24.9 mmol/L (ref 20.0–28.0)
Drawn by: 257881
O2 SAT: 97.6 %
PCO2 ART: 39 mmHg (ref 32.0–48.0)
PO2 ART: 96 mmHg (ref 83.0–108.0)
Patient temperature: 98.6
pH, Arterial: 7.421 (ref 7.350–7.450)

## 2017-06-24 LAB — CBC
HEMATOCRIT: 43.8 % (ref 39.0–52.0)
Hemoglobin: 15.2 g/dL (ref 13.0–17.0)
MCH: 32 pg (ref 26.0–34.0)
MCHC: 34.7 g/dL (ref 30.0–36.0)
MCV: 92.2 fL (ref 78.0–100.0)
Platelets: 215 10*3/uL (ref 150–400)
RBC: 4.75 MIL/uL (ref 4.22–5.81)
RDW: 12.6 % (ref 11.5–15.5)
WBC: 8.9 10*3/uL (ref 4.0–10.5)

## 2017-06-24 LAB — PREPARE RBC (CROSSMATCH)

## 2017-06-24 LAB — SURGICAL PCR SCREEN
MRSA, PCR: NEGATIVE
Staphylococcus aureus: NEGATIVE

## 2017-06-24 LAB — ABO/RH: ABO/RH(D): O POS

## 2017-06-24 LAB — APTT: aPTT: 34 seconds (ref 24–36)

## 2017-06-24 NOTE — Progress Notes (Signed)
PCP - Dr. Luciana Axe Cardiologist - Dr. Harl Bowie  Chest x-ray - n/a EKG - 01/27/2017 Stress Test - 03/26/2017 ECHO - 04/29/2017 Cardiac Cath - patient denies  Sleep Study - patient denies   Patient denies shortness of breath, fever, cough and chest pain at PAT appointment   Patient verbalized understanding of instructions that were given to them at the PAT appointment. Patient was also instructed that they will need to review over the PAT instructions again at home before surgery.

## 2017-06-24 NOTE — Pre-Procedure Instructions (Signed)
PADDY NEIS  06/24/2017      [id number] - [place], [place] [address] Phone: [phone] Fax: [phone]    Your procedure is scheduled on Monday, 06/28/2017.  Report to [facility] Admitting at [place].M.  Call this number if you have problems the morning of surgery:  [phone]   Remember:  Do not eat food or drink liquids after midnight.  Continue all other medications as directed by your physician except follow these medication instructions before surgery   Take these medicines the morning of surgery with A SIP OF WATER: Amlodipine (Norvasc) Carvedilol (Coreg)  7 days prior to surgery STOP taking any Aspirin (unless otherwise instructed by your surgeon), Aleve, Naproxen, Ibuprofen, Motrin, Advil, Goody's, BC's, all herbal medications, fish oil, and all vitamins    Do not wear jewelry.  Do not wear lotions, powders, or colognes, or deodorant.  Men may shave face and neck.  Do not bring valuables to the hospital.  [facility] is not responsible for any belongings or valuables.  Contacts, eyeglasses, dentures or bridgework may not be worn into surgery.  Leave your suitcase in the car.  After surgery it may be brought to your room.  For patients admitted to the hospital, discharge time will be determined by your treatment team.  Patients discharged the day of surgery will not be allowed to drive home.   Name and phone number of your driver:    Special instructions:   [facility]- Preparing For Surgery  Before surgery, you can play an important role. Because skin is not sterile, your skin needs to be as free of germs as possible. You can reduce the number of germs on your skin by washing with CHG (chlorahexidine gluconate) Soap before surgery.  CHG is an antiseptic cleaner which kills germs and bonds with the skin to continue killing germs even after washing.  Please do not use if you have  an allergy to CHG or antibacterial soaps. If your skin becomes reddened/irritated stop using the CHG.  Do not shave (including legs and underarms) for at least 48 hours prior to first CHG shower. It is OK to shave your face.  Please follow these instructions carefully.   1. Shower the NIGHT BEFORE SURGERY and the MORNING OF SURGERY with CHG.   2. If you chose to wash your hair, wash your hair first as usual with your normal shampoo.  3. After you shampoo, rinse your hair and body thoroughly to remove the shampoo.  4. Use CHG as you would any other liquid soap. You can apply CHG directly to the skin and wash gently with a scrungie or a clean washcloth.   5. Apply the CHG Soap to your body ONLY FROM THE NECK DOWN.  Do not use on open wounds or open sores. Avoid contact with your eyes, ears, mouth and genitals (private parts). Wash Face and genitals (private parts)  with your normal soap.  6. Wash thoroughly, paying special attention to the area where your surgery will be performed.  7. Thoroughly rinse your body with warm water from the neck down.  8. DO NOT shower/wash with your normal soap after using and rinsing off the CHG Soap.  9. Pat yourself dry with a CLEAN TOWEL.  10. Wear CLEAN PAJAMAS to bed the night before surgery, wear comfortable clothes the morning of surgery  11. Place CLEAN SHEETS on your bed the night  of your first shower and DO NOT SLEEP WITH PETS.    Day of Surgery: Shower as stated above. Do not apply any deodorants/lotions. Please wear clean clothes to the hospital/surgery center.      Please read over the following fact sheets that you were given. Pain Booklet, Coughing and Deep Breathing, MRSA Information and Surgical Site Infection Prevention

## 2017-06-25 ENCOUNTER — Encounter (HOSPITAL_COMMUNITY): Payer: Self-pay

## 2017-06-25 NOTE — Progress Notes (Signed)
Anesthesia Chart Review:   Pt is a 66 year old male scheduled for open repair abdominal aortic aneurysm on 06/28/2017 with [name], MD  - PCP is Aggie Hacker, PA - Cardiologist is Carlyle Dolly, MD who has cleared pt for surgery  PMH includes:  AAA, HTN, hyperlipidemia, CKD (stage 3). Former smoker. BMI 29  Medications include: amlodipine, ASA 81mg , carvedilol, lisinopril  BP 137/82   Pulse 66   Temp 36.6 C   Resp 20   Ht 5\' 9"  (1.753 m)   Wt 197 lb 3.2 oz (89.4 kg)   SpO2 97%   BMI 29.12 kg/m   Preoperative labs reviewed.    EKG 01/27/17: Sinus  Rhythm  -First degree A-V block PRi = 236. Low voltage in limb leads.   Echo 04/30/17:  - Left ventricle: The cavity size was normal. Wall thickness was increased in a pattern of moderate LVH. Systolic function was normal. The estimated ejection fraction was in the range of 60% to 65%. Wall motion was normal; there were no regional wall motion abnormalities. Doppler parameters are consistent with abnormal left ventricular relaxation (grade 1 diastolic dysfunction). - Aortic valve: Valve area (VTI): 3.52 cm^2. Valve area (Vmax): 3.11 cm^2. Valve area (Vmean): 3.12 cm^2. - Aorta: Aortic root dimension: 40 mm (ED). Ascending aortic diameter: 40 mm (S). - Aortic root: The aortic root was mildly dilated. - Left atrium: The atrium was moderately dilated. - Technically adequate study.  Nuclear stress test 03/26/17:   Nuclear stress EF: 61%.  There was no ST segment deviation noted during stress.  Defect 1: There is a defect present in the basal inferior, basal inferolateral, mid inferior, mid inferolateral and apical inferior location.  Findings consistent with ischemia.  This is an intermediate risk study.  The left ventricular ejection fraction is normal (55-65%). - There is a large area, moderate severity defect in the basal and mid inferolateral, inferior and apical inferior walls (SDS=6) consistent with ischemia in the  LCX territory.  - Dr. Harl Bowie documents in 04/28/17 office visit note: "In summary he had an intermediate risk nuclear stress test with normal LVEF by echo, and tolerates well over 4 METs without difficultly and in general has not had any cardiopulmonary symptoms. There is no indication for cath at this time, as in this setting there is no role for any form of prophylactic pci to get through surgery as data does not support this strategy. Recommend proceeding with surgery as planned, continue cardiac meds in periop period."  CT abdomen/pelvis 02/10/17:  1. Aneurysmal dilatation of the abdominal aorta. The dominant infrarenal aortic aneurysm measures 5.4 cm in maximum diameter. 2. Mild aneurysmal dilatation of the right common iliac artery. Small right renal artery aneurysm. 3. Bilateral adrenal gland adenoma 4. Probable cyst in the lower pole right kidney but further evaluation limited without contrast 5. Age indeterminate mild superior endplate compression of T11  AAA duplex 01/14/17: 5.23 x 5.03 mid to distal AAA.   If no changes, I anticipate pt can proceed with surgery as scheduled.   Willeen Cass, FNP-BC [facility]/Anesthesiology Phone: [phone] 06/25/2017 11:16 AM

## 2017-06-27 NOTE — Anesthesia Preprocedure Evaluation (Addendum)
Anesthesia Evaluation  Patient identified by MRN, date of birth, ID band Patient awake    Reviewed: Allergy & Precautions, NPO status , Patient's Chart, lab work & pertinent test results  History of Anesthesia Complications Negative for: history of anesthetic complications  Airway Mallampati: II  TM Distance: >3 FB Neck ROM: Full    Dental no notable dental hx. (+) Dental Advisory Given   Pulmonary former smoker,    Pulmonary exam normal        Cardiovascular hypertension, Pt. on home beta blockers + Peripheral Vascular Disease  Normal cardiovascular exam  Study Conclusions  - Left ventricle: The cavity size was normal. Wall thickness was   increased in a pattern of moderate LVH. Systolic function was   normal. The estimated ejection fraction was in the range of 60%   to 65%. Wall motion was normal; there were no regional wall   motion abnormalities.    Neuro/Psych negative neurological ROS  negative psych ROS   GI/Hepatic negative GI ROS, Neg liver ROS,   Endo/Other  negative endocrine ROS  Renal/GU Renal InsufficiencyRenal disease     Musculoskeletal negative musculoskeletal ROS (+)   Abdominal   Peds  Hematology negative hematology ROS (+)   Anesthesia Other Findings Day of surgery medications reviewed with the patient.  Reproductive/Obstetrics negative OB ROS                            Anesthesia Physical Anesthesia Plan  ASA: III  Anesthesia Plan: General   Post-op Pain Management:    Induction: Intravenous  PONV Risk Score and Plan: 4 or greater and Ondansetron, Dexamethasone, Scopolamine patch - Pre-op and Treatment may vary due to age or medical condition  Airway Management Planned: Oral ETT  Additional Equipment: Arterial line, PA Cath and Ultrasound Guidance Line Placement  Intra-op Plan:   Post-operative Plan: Possible Post-op  intubation/ventilation  Informed Consent: I have reviewed the patients History and Physical, chart, labs and discussed the procedure including the risks, benefits and alternatives for the proposed anesthesia with the patient or authorized representative who has indicated his/her understanding and acceptance.   Dental advisory given  Plan Discussed with: CRNA, Anesthesiologist and Surgeon  Anesthesia Plan Comments:        Anesthesia Quick Evaluation

## 2017-06-28 ENCOUNTER — Inpatient Hospital Stay (HOSPITAL_COMMUNITY)
Admission: RE | Admit: 2017-06-28 | Discharge: 2017-07-06 | DRG: 269 | Disposition: A | Discharge status: Home / Self Care | Payer: PPO | Source: Ambulatory Visit | Attending: Vascular Surgery | Admitting: Vascular Surgery

## 2017-06-28 ENCOUNTER — Inpatient Hospital Stay (HOSPITAL_COMMUNITY): Payer: PPO

## 2017-06-28 ENCOUNTER — Encounter (HOSPITAL_COMMUNITY): Payer: Self-pay | Admitting: *Deleted

## 2017-06-28 ENCOUNTER — Inpatient Hospital Stay (HOSPITAL_COMMUNITY): Payer: PPO | Admitting: Certified Registered Nurse Anesthetist

## 2017-06-28 ENCOUNTER — Encounter (HOSPITAL_COMMUNITY)
Admission: RE | Disposition: A | Discharge status: Home / Self Care | Payer: Self-pay | Source: Ambulatory Visit | Attending: Vascular Surgery

## 2017-06-28 ENCOUNTER — Inpatient Hospital Stay (HOSPITAL_COMMUNITY): Payer: PPO | Admitting: Emergency Medicine

## 2017-06-28 DIAGNOSIS — Z888 Allergy status to other drugs, medicaments and biological substances status: Secondary | ICD-10-CM

## 2017-06-28 DIAGNOSIS — Z87891 Personal history of nicotine dependence: Secondary | ICD-10-CM

## 2017-06-28 DIAGNOSIS — Z9889 Other specified postprocedural states: Secondary | ICD-10-CM

## 2017-06-28 DIAGNOSIS — Z79899 Other long term (current) drug therapy: Secondary | ICD-10-CM

## 2017-06-28 DIAGNOSIS — I714 Abdominal aortic aneurysm, without rupture, unspecified: Secondary | ICD-10-CM | POA: Diagnosis present

## 2017-06-28 DIAGNOSIS — L231 Allergic contact dermatitis due to adhesives: Secondary | ICD-10-CM | POA: Diagnosis not present

## 2017-06-28 DIAGNOSIS — I722 Aneurysm of renal artery: Secondary | ICD-10-CM | POA: Diagnosis present

## 2017-06-28 DIAGNOSIS — N183 Chronic kidney disease, stage 3 unspecified: Secondary | ICD-10-CM

## 2017-06-28 DIAGNOSIS — G8918 Other acute postprocedural pain: Secondary | ICD-10-CM

## 2017-06-28 DIAGNOSIS — I10 Essential (primary) hypertension: Secondary | ICD-10-CM

## 2017-06-28 DIAGNOSIS — E785 Hyperlipidemia, unspecified: Secondary | ICD-10-CM

## 2017-06-28 DIAGNOSIS — I110 Hypertensive heart disease with heart failure: Secondary | ICD-10-CM | POA: Diagnosis not present

## 2017-06-28 DIAGNOSIS — T65891A Toxic effect of other specified substances, accidental (unintentional), initial encounter: Secondary | ICD-10-CM | POA: Diagnosis not present

## 2017-06-28 DIAGNOSIS — E871 Hypo-osmolality and hyponatremia: Secondary | ICD-10-CM | POA: Diagnosis not present

## 2017-06-28 DIAGNOSIS — Z7982 Long term (current) use of aspirin: Secondary | ICD-10-CM

## 2017-06-28 DIAGNOSIS — D62 Acute posthemorrhagic anemia: Secondary | ICD-10-CM | POA: Diagnosis not present

## 2017-06-28 DIAGNOSIS — K567 Ileus, unspecified: Secondary | ICD-10-CM | POA: Diagnosis not present

## 2017-06-28 DIAGNOSIS — K660 Peritoneal adhesions (postprocedural) (postinfection): Secondary | ICD-10-CM | POA: Diagnosis present

## 2017-06-28 DIAGNOSIS — I5022 Chronic systolic (congestive) heart failure: Secondary | ICD-10-CM | POA: Diagnosis not present

## 2017-06-28 DIAGNOSIS — E7800 Pure hypercholesterolemia, unspecified: Secondary | ICD-10-CM | POA: Diagnosis present

## 2017-06-28 DIAGNOSIS — I129 Hypertensive chronic kidney disease with stage 1 through stage 4 chronic kidney disease, or unspecified chronic kidney disease: Secondary | ICD-10-CM | POA: Diagnosis present

## 2017-06-28 DIAGNOSIS — J9811 Atelectasis: Secondary | ICD-10-CM | POA: Diagnosis not present

## 2017-06-28 DIAGNOSIS — Z4682 Encounter for fitting and adjustment of non-vascular catheter: Secondary | ICD-10-CM | POA: Diagnosis not present

## 2017-06-28 DIAGNOSIS — Y92230 Patient room in hospital as the place of occurrence of the external cause: Secondary | ICD-10-CM | POA: Diagnosis not present

## 2017-06-28 DIAGNOSIS — Z885 Allergy status to narcotic agent status: Secondary | ICD-10-CM

## 2017-06-28 HISTORY — PX: ABDOMINAL AORTIC ANEURYSM REPAIR: SHX42

## 2017-06-28 LAB — BASIC METABOLIC PANEL
Anion gap: 6 (ref 5–15)
BUN: 18 mg/dL (ref 6–20)
CALCIUM: 8 mg/dL — AB (ref 8.9–10.3)
CO2: 23 mmol/L (ref 22–32)
CREATININE: 1.51 mg/dL — AB (ref 0.61–1.24)
Chloride: 106 mmol/L (ref 101–111)
GFR calc non Af Amer: 46 mL/min — ABNORMAL LOW (ref >60)
GFR, EST AFRICAN AMERICAN: 54 mL/min — AB (ref >60)
Glucose, Bld: 152 mg/dL — ABNORMAL HIGH (ref 65–99)
Potassium: 4.7 mmol/L (ref 3.5–5.1)
SODIUM: 135 mmol/L (ref 135–145)

## 2017-06-28 LAB — URINALYSIS, ROUTINE W REFLEX MICROSCOPIC
BILIRUBIN URINE: NEGATIVE
Glucose, UA: NEGATIVE mg/dL
Hgb urine dipstick: NEGATIVE
KETONES UR: NEGATIVE mg/dL
Leukocytes, UA: NEGATIVE
NITRITE: NEGATIVE
Protein, ur: NEGATIVE mg/dL
SPECIFIC GRAVITY, URINE: 1.011 (ref 1.005–1.030)
pH: 7 (ref 5.0–8.0)

## 2017-06-28 LAB — MAGNESIUM: Magnesium: 1.9 mg/dL (ref 1.7–2.4)

## 2017-06-28 LAB — CBC
HCT: 41 % (ref 39.0–52.0)
Hemoglobin: 14.2 g/dL (ref 13.0–17.0)
MCH: 32.3 pg (ref 26.0–34.0)
MCHC: 34.6 g/dL (ref 30.0–36.0)
MCV: 93.2 fL (ref 78.0–100.0)
Platelets: 162 10*3/uL (ref 150–400)
RBC: 4.4 MIL/uL (ref 4.22–5.81)
RDW: 12.8 % (ref 11.5–15.5)
WBC: 17.4 10*3/uL — ABNORMAL HIGH (ref 4.0–10.5)

## 2017-06-28 LAB — PREPARE RBC (CROSSMATCH)

## 2017-06-28 LAB — PROTIME-INR
INR: 1.22
Prothrombin Time: 15.3 seconds — ABNORMAL HIGH (ref 11.4–15.2)

## 2017-06-28 LAB — APTT: aPTT: 47 seconds — ABNORMAL HIGH (ref 24–36)

## 2017-06-28 SURGERY — ANEURYSM ABDOMINAL AORTIC REPAIR
Anesthesia: General | Site: Abdomen

## 2017-06-28 MED ORDER — IPRATROPIUM-ALBUTEROL 20-100 MCG/ACT IN AERS
INHALATION_SPRAY | RESPIRATORY_TRACT | Status: DC | PRN
  Administered 2017-06-28: 4 via RESPIRATORY_TRACT

## 2017-06-28 MED ORDER — SODIUM CHLORIDE 0.9 % IV SOLN: INTRAVENOUS | Status: DC

## 2017-06-28 MED ORDER — POTASSIUM CHLORIDE CRYS ER 20 MEQ PO TBCR: 20.0000 meq | EXTENDED_RELEASE_TABLET | Freq: Every day | ORAL | Status: DC | PRN

## 2017-06-28 MED ORDER — 0.9 % SODIUM CHLORIDE (POUR BTL) OPTIME
TOPICAL | Status: DC | PRN
  Administered 2017-06-28: 3000 mL

## 2017-06-28 MED ORDER — FENTANYL CITRATE (PF) 250 MCG/5ML IJ SOLN
INTRAMUSCULAR | Status: DC | PRN
  Administered 2017-06-28 (×4): 50 ug via INTRAVENOUS
  Administered 2017-06-28: 100 ug via INTRAVENOUS

## 2017-06-28 MED ORDER — PHENOL 1.4 % MT LIQD
1.0000 | OROMUCOSAL | Status: DC | PRN
  Administered 2017-07-04: 1 via OROMUCOSAL
  Filled 2017-06-28: qty 177

## 2017-06-28 MED ORDER — VASOPRESSIN 20 UNIT/ML IV SOLN
INTRAVENOUS | Status: DC | PRN
  Administered 2017-06-28: 3 [IU] via INTRAVENOUS

## 2017-06-28 MED ORDER — BISACODYL 10 MG RE SUPP
10.0000 mg | Freq: Every day | RECTAL | Status: DC | PRN
  Administered 2017-06-30: 10 mg via RECTAL
  Filled 2017-06-28: qty 1

## 2017-06-28 MED ORDER — EPHEDRINE SULFATE 50 MG/ML IJ SOLN
INTRAMUSCULAR | Status: DC | PRN
  Administered 2017-06-28 (×7): 5 mg via INTRAVENOUS

## 2017-06-28 MED ORDER — SODIUM CHLORIDE 0.9 % IV SOLN: 500.0000 mL | Freq: Once | INTRAVENOUS | Status: DC | PRN

## 2017-06-28 MED ORDER — LIDOCAINE 2% (20 MG/ML) 5 ML SYRINGE
INTRAMUSCULAR | Status: DC | PRN
  Administered 2017-06-28: 100 mg via INTRAVENOUS

## 2017-06-28 MED ORDER — MIDAZOLAM HCL 2 MG/2ML IJ SOLN
INTRAMUSCULAR | Status: DC | PRN
  Administered 2017-06-28: 2 mg via INTRAVENOUS

## 2017-06-28 MED ORDER — LABETALOL HCL 5 MG/ML IV SOLN: 10.0000 mg | INTRAVENOUS | Status: DC | PRN

## 2017-06-28 MED ORDER — DEXAMETHASONE SODIUM PHOSPHATE 10 MG/ML IJ SOLN
INTRAMUSCULAR | Status: DC | PRN
  Administered 2017-06-28: 10 mg via INTRAVENOUS

## 2017-06-28 MED ORDER — PROPOFOL 10 MG/ML IV BOLUS
INTRAVENOUS | Status: DC | PRN
  Administered 2017-06-28: 140 mg via INTRAVENOUS

## 2017-06-28 MED ORDER — ACETAMINOPHEN 325 MG RE SUPP
325.0000 mg | RECTAL | Status: DC | PRN
  Filled 2017-06-28: qty 2

## 2017-06-28 MED ORDER — ENOXAPARIN SODIUM 40 MG/0.4ML ~~LOC~~ SOLN: 40.0000 mg | SUBCUTANEOUS | Status: DC

## 2017-06-28 MED ORDER — ALUM & MAG HYDROXIDE-SIMETH 200-200-20 MG/5ML PO SUSP
15.0000 mL | ORAL | Status: DC | PRN
  Administered 2017-06-29 – 2017-07-04 (×3): 30 mL via ORAL
  Filled 2017-06-28 (×3): qty 30

## 2017-06-28 MED ORDER — FENTANYL CITRATE (PF) 250 MCG/5ML IJ SOLN
INTRAMUSCULAR | Status: AC
  Filled 2017-06-28: qty 5

## 2017-06-28 MED ORDER — HYDRALAZINE HCL 20 MG/ML IJ SOLN: 5.0000 mg | INTRAMUSCULAR | Status: DC | PRN

## 2017-06-28 MED ORDER — LACTATED RINGERS IV SOLN
INTRAVENOUS | Status: DC | PRN
  Administered 2017-06-28: 07:00:00 via INTRAVENOUS

## 2017-06-28 MED ORDER — PROTAMINE SULFATE 10 MG/ML IV SOLN
INTRAVENOUS | Status: DC | PRN
  Administered 2017-06-28 (×2): 20 mg via INTRAVENOUS
  Administered 2017-06-28: 10 mg via INTRAVENOUS

## 2017-06-28 MED ORDER — DEXTROSE 5 % IV SOLN
INTRAVENOUS | Status: AC
  Filled 2017-06-28: qty 1.5

## 2017-06-28 MED ORDER — ALBUMIN HUMAN 5 % IV SOLN
INTRAVENOUS | Status: DC | PRN
  Administered 2017-06-28 (×3): via INTRAVENOUS

## 2017-06-28 MED ORDER — ESMOLOL HCL 100 MG/10ML IV SOLN
INTRAVENOUS | Status: DC | PRN
  Administered 2017-06-28: 20 mg via INTRAVENOUS

## 2017-06-28 MED ORDER — DEXTROSE 5 % IV SOLN
1.5000 g | INTRAVENOUS | Status: AC
  Administered 2017-06-28: 1.5 g via INTRAVENOUS

## 2017-06-28 MED ORDER — PANTOPRAZOLE SODIUM 40 MG PO TBEC
40.0000 mg | DELAYED_RELEASE_TABLET | Freq: Every day | ORAL | Status: DC
  Administered 2017-06-29: 40 mg via ORAL
  Filled 2017-06-28: qty 1

## 2017-06-28 MED ORDER — PHENYLEPHRINE HCL 10 MG/ML IJ SOLN
INTRAMUSCULAR | Status: DC | PRN
  Administered 2017-06-28: 25 ug/min via INTRAVENOUS

## 2017-06-28 MED ORDER — ROCURONIUM BROMIDE 10 MG/ML (PF) SYRINGE
PREFILLED_SYRINGE | INTRAVENOUS | Status: DC | PRN
  Administered 2017-06-28 (×2): 50 mg via INTRAVENOUS
  Administered 2017-06-28: 30 mg via INTRAVENOUS
  Administered 2017-06-28: 70 mg via INTRAVENOUS

## 2017-06-28 MED ORDER — ORAL CARE MOUTH RINSE
15.0000 mL | Freq: Two times a day (BID) | OROMUCOSAL | Status: DC
  Administered 2017-06-28 – 2017-07-05 (×9): 15 mL via OROMUCOSAL

## 2017-06-28 MED ORDER — DEXTROSE 5 % IV SOLN
1.5000 g | Freq: Two times a day (BID) | INTRAVENOUS | Status: AC
  Administered 2017-06-28 – 2017-06-29 (×2): 1.5 g via INTRAVENOUS
  Filled 2017-06-28 (×2): qty 1.5

## 2017-06-28 MED ORDER — HEPARIN SODIUM (PORCINE) 1000 UNIT/ML IJ SOLN
INTRAMUSCULAR | Status: DC | PRN
  Administered 2017-06-28: 9000 [IU] via INTRAVENOUS
  Administered 2017-06-28: 4500 [IU] via INTRAVENOUS

## 2017-06-28 MED ORDER — MORPHINE SULFATE (PF) 4 MG/ML IV SOLN
2.0000 mg | INTRAVENOUS | Status: DC | PRN
  Administered 2017-06-28 – 2017-07-01 (×23): 4 mg via INTRAVENOUS
  Administered 2017-07-01: 5 mg via INTRAVENOUS
  Administered 2017-07-01: 4 mg via INTRAVENOUS
  Administered 2017-07-01 (×2): 2 mg via INTRAVENOUS
  Administered 2017-07-01: 4 mg via INTRAVENOUS
  Administered 2017-07-01: 2 mg via INTRAVENOUS
  Administered 2017-07-02 (×3): 4 mg via INTRAVENOUS
  Administered 2017-07-02: 2 mg via INTRAVENOUS
  Filled 2017-06-28 (×7): qty 1
  Filled 2017-06-28: qty 2
  Filled 2017-06-28 (×25): qty 1

## 2017-06-28 MED ORDER — ACETAMINOPHEN 325 MG PO TABS: 325.0000 mg | ORAL_TABLET | ORAL | Status: DC | PRN

## 2017-06-28 MED ORDER — KCL IN DEXTROSE-NACL 20-5-0.45 MEQ/L-%-% IV SOLN
INTRAVENOUS | Status: DC
  Administered 2017-06-28 – 2017-07-01 (×8): via INTRAVENOUS
  Filled 2017-06-28 (×11): qty 1000

## 2017-06-28 MED ORDER — HYDROMORPHONE HCL 1 MG/ML IJ SOLN
INTRAMUSCULAR | Status: AC
  Filled 2017-06-28: qty 1

## 2017-06-28 MED ORDER — DOCUSATE SODIUM 100 MG PO CAPS
100.0000 mg | ORAL_CAPSULE | Freq: Every day | ORAL | Status: DC
  Administered 2017-06-29 – 2017-07-06 (×6): 100 mg via ORAL
  Filled 2017-06-28 (×6): qty 1

## 2017-06-28 MED ORDER — CHLORHEXIDINE GLUCONATE CLOTH 2 % EX PADS: 6.0000 | MEDICATED_PAD | Freq: Once | CUTANEOUS | Status: DC

## 2017-06-28 MED ORDER — SUGAMMADEX SODIUM 200 MG/2ML IV SOLN
INTRAVENOUS | Status: DC | PRN
  Administered 2017-06-28: 357.6 mg via INTRAVENOUS

## 2017-06-28 MED ORDER — SODIUM CHLORIDE 0.9 % IV SOLN: Freq: Once | INTRAVENOUS | Status: DC

## 2017-06-28 MED ORDER — ONDANSETRON HCL 4 MG/2ML IJ SOLN
4.0000 mg | Freq: Four times a day (QID) | INTRAMUSCULAR | Status: DC | PRN
  Administered 2017-06-29: 4 mg via INTRAVENOUS
  Filled 2017-06-28: qty 2

## 2017-06-28 MED ORDER — SODIUM CHLORIDE 0.9 % IV SOLN
INTRAVENOUS | Status: DC | PRN
  Administered 2017-06-28: 09:00:00 500 mL

## 2017-06-28 MED ORDER — HYDROMORPHONE HCL 1 MG/ML IJ SOLN
0.2500 mg | INTRAMUSCULAR | Status: DC | PRN
  Administered 2017-06-28 (×2): 0.5 mg via INTRAVENOUS

## 2017-06-28 MED ORDER — PROMETHAZINE HCL 25 MG/ML IJ SOLN: 6.2500 mg | INTRAMUSCULAR | Status: DC | PRN

## 2017-06-28 MED ORDER — PROPOFOL 10 MG/ML IV BOLUS
INTRAVENOUS | Status: AC
  Filled 2017-06-28: qty 20

## 2017-06-28 MED ORDER — ONDANSETRON HCL 4 MG/2ML IJ SOLN
INTRAMUSCULAR | Status: DC | PRN
  Administered 2017-06-28: 4 mg via INTRAVENOUS

## 2017-06-28 MED ORDER — GUAIFENESIN-DM 100-10 MG/5ML PO SYRP
15.0000 mL | ORAL_SOLUTION | ORAL | Status: DC | PRN
  Administered 2017-07-04 (×3): 15 mL via ORAL
  Filled 2017-06-28 (×3): qty 15

## 2017-06-28 MED ORDER — MORPHINE SULFATE (PF) 2 MG/ML IV SOLN: 2.0000 mg | INTRAVENOUS | Status: DC | PRN

## 2017-06-28 MED ORDER — MAGNESIUM SULFATE 2 GM/50ML IV SOLN
2.0000 g | Freq: Every day | INTRAVENOUS | Status: DC | PRN
  Filled 2017-06-28: qty 50

## 2017-06-28 MED ORDER — MIDAZOLAM HCL 2 MG/2ML IJ SOLN
INTRAMUSCULAR | Status: AC
  Filled 2017-06-28: qty 2

## 2017-06-28 MED ORDER — VASOPRESSIN 20 UNIT/ML IV SOLN
INTRAVENOUS | Status: AC
  Filled 2017-06-28: qty 1

## 2017-06-28 MED ORDER — METOPROLOL TARTRATE 5 MG/5ML IV SOLN: 2.0000 mg | INTRAVENOUS | Status: DC | PRN

## 2017-06-28 SURGICAL SUPPLY — 45 items
ATTRACTOMAT 16X20 MAGNETIC DRP (DRAPES) ×2 IMPLANT
CANISTER SUCT 3000ML PPV (MISCELLANEOUS) ×2 IMPLANT
CANNULA VESSEL 3MM 2 BLNT TIP (CANNULA) ×2 IMPLANT
CLIP VESOCCLUDE MED 24/CT (CLIP) ×2 IMPLANT
CLIP VESOCCLUDE SM WIDE 24/CT (CLIP) ×2 IMPLANT
DERMABOND ADVANCED (GAUZE/BANDAGES/DRESSINGS) ×1
DERMABOND ADVANCED .7 DNX12 (GAUZE/BANDAGES/DRESSINGS) ×1 IMPLANT
ELECT BLADE 6.5 EXT (BLADE) ×2 IMPLANT
ELECT REM PT RETURN 9FT ADLT (ELECTROSURGICAL) ×2
ELECTRODE REM PT RTRN 9FT ADLT (ELECTROSURGICAL) ×1 IMPLANT
FELT TEFLON 1X6 (MISCELLANEOUS) ×2 IMPLANT
GLOVE BIO SURGEON STRL SZ7.5 (GLOVE) ×6 IMPLANT
GOWN STRL REUS W/ TWL LRG LVL3 (GOWN DISPOSABLE) ×5 IMPLANT
GOWN STRL REUS W/ TWL XL LVL3 (GOWN DISPOSABLE) ×1 IMPLANT
GOWN STRL REUS W/TWL LRG LVL3 (GOWN DISPOSABLE) ×5
GOWN STRL REUS W/TWL XL LVL3 (GOWN DISPOSABLE) ×1
GRAFT HEMASHIELD 22X30M (Vascular Products) ×2 IMPLANT
INSERT FOGARTY 61MM (MISCELLANEOUS) ×4 IMPLANT
INSERT FOGARTY SM (MISCELLANEOUS) ×8 IMPLANT
KIT BASIN OR (CUSTOM PROCEDURE TRAY) ×2 IMPLANT
KIT ROOM TURNOVER OR (KITS) ×2 IMPLANT
LOOP VESSEL MINI RED (MISCELLANEOUS) IMPLANT
NS IRRIG 1000ML POUR BTL (IV SOLUTION) ×4 IMPLANT
PACK AORTA (CUSTOM PROCEDURE TRAY) ×2 IMPLANT
PAD ARMBOARD 7.5X6 YLW CONV (MISCELLANEOUS) ×4 IMPLANT
RETAINER VISCERA MED (MISCELLANEOUS) ×2 IMPLANT
SPONGE LAP 18X18 X RAY DECT (DISPOSABLE) IMPLANT
SPONGE SURGIFOAM ABS GEL 100 (HEMOSTASIS) IMPLANT
STAPLER VISISTAT 35W (STAPLE) IMPLANT
SUT PDS AB 1 TP1 54 (SUTURE) ×4 IMPLANT
SUT PROLENE 3 0 SH DA (SUTURE) ×18 IMPLANT
SUT PROLENE 3 0 SH1 36 (SUTURE) ×4 IMPLANT
SUT PROLENE 5 0 C 1 24 (SUTURE) ×4 IMPLANT
SUT PROLENE 5 0 CC 1 (SUTURE) ×4 IMPLANT
SUT PROLENE 6 0 C 1 30 (SUTURE) ×2 IMPLANT
SUT SILK 2 0SH CR/8 30 (SUTURE) ×2 IMPLANT
SUT VIC AB 1 CTX 27 (SUTURE) ×2 IMPLANT
SUT VIC AB 2-0 SH 27 (SUTURE)
SUT VIC AB 2-0 SH 27XBRD (SUTURE) IMPLANT
SUT VIC AB 3-0 SH 27 (SUTURE) ×2
SUT VIC AB 3-0 SH 27X BRD (SUTURE) ×2 IMPLANT
SUT VIC AB 4-0 PS2 27 (SUTURE) ×2 IMPLANT
TOWEL BLUE STERILE X RAY DET (MISCELLANEOUS) ×4 IMPLANT
TRAY FOLEY W/METER SILVER 16FR (SET/KITS/TRAYS/PACK) ×2 IMPLANT
WATER STERILE IRR 1000ML POUR (IV SOLUTION) ×4 IMPLANT

## 2017-06-28 NOTE — Transfer of Care (Signed)
Immediate Anesthesia Transfer of Care Note  Patient: Nicolas Butler  Procedure(s) Performed: ANEURYSM ABDOMINAL AORTIC REPAIR OPEN (N/A Abdomen)  Patient Location: PACU  Anesthesia Type:General  Level of Consciousness: drowsy  Airway & Oxygen Therapy: Patient Spontanous Breathing and Patient connected to face mask oxygen  Post-op Assessment: Report given to RN and Post -op Vital signs reviewed and stable  Post vital signs: Reviewed and stable  Last Vitals:  Vitals:   06/28/17 0615 06/28/17 0650  BP: 127/76   Pulse: (!) 56 (!) 53  Resp: 20 18  Temp: 36.7 C   SpO2: 97% 99%    Last Pain:  Vitals:   06/28/17 0615  TempSrc: Oral      Patients Stated Pain Goal: 3 (31/54/00 8676)  Complications: No apparent anesthesia complications

## 2017-06-28 NOTE — Progress Notes (Signed)
Left side of abdomen with raised erythematous areas about a dozen with 3 cm circumference No itching No prior reaction to betadine or iodine Most likely skin reaction to [place] drape. Currently asymptomatic Will observe for now  [name], MD [facility] [employer] Office: [phone] Pager: [phone]

## 2017-06-28 NOTE — Progress Notes (Signed)
Rash noted over mid and left side of pt's abdomen. It is not bothering him. Dr Tobias Alexander & Dr Oneida Alar notified. Dr Oneida Alar will be by at some time to see pt-OK to tx to [facility].

## 2017-06-28 NOTE — Progress Notes (Signed)
   Rash is dissipating on his abdomin, incision intact Palpable DP2+  S/P Open Abdominal AAA repair Stable disposition Tal Kempker MAUREEN PA-C

## 2017-06-28 NOTE — H&P (View-Only) (Signed)
Patient name: Nicolas Butler MRN: [id number] DOB: 29-Sep-1950 Sex: male  HPI: Nicolas Butler is a 66 y.o. male who returns today for follow-up and consideration of repair of abdominal aortic aneurysm. He was deemed not to be an infrarenal stent graft candidate due to the large diameter of the aortic neck. He has recently had a cardiac stress test which was negative. He denies any abdominal or back pain currently. He has had one previous abdominal operation for an umbilical hernia repair. He denies any claudication symptoms. His aneurysm was originally discovered in 2017 and was 4.8 cm in diameter at that time. He has no family history of abdominal aortic aneurysm. He is on aspirin and a statin. He does have shortness of breath with vigorous activity. He is a former smoker.   Past Medical History:  Diagnosis Date  . AAA (abdominal aortic aneurysm) ([place])   . Unspecified essential hypertension    Past Surgical History:  Procedure Laterality Date  . HAND SURGERY Left   . INCISIONAL HERNIA REPAIR     LEFT  . IRRIGATION AND DEBRIDEMENT SEBACEOUS CYST     FROM RIGHT SHOULDER  . UMBILICAL HERNIA REPAIR      Family History  Problem Relation Age of Onset  . Pneumonia Mother   . Other Father        brain tumor  . Diabetes Son     SOCIAL HISTORY: Social History   Social History  . Marital status: Legally Separated    Spouse name: N/A  . Number of children: N/A  . Years of education: N/A   Occupational History  . Not on file.   Social History Main Topics  . Smoking status: Former Smoker    Years: 46.00    Types: Cigarettes    Start date: 05/17/1965    Quit date: 12/07/2012  . Smokeless tobacco: Never Used  . Alcohol use No  . Drug use: No  . Sexual activity: Not on file   Other Topics Concern  . Not on file   Social History Narrative  . No narrative on file    Allergies  Allergen Reactions  . Hydrochlorothiazide Other (See Comments)    Pt states "it made my legs  hurt"  . Atorvastatin Other (See Comments)    Muscle pain  . Pravastatin Other (See Comments)    Muscle pain    Current Outpatient Prescriptions  Medication Sig Dispense Refill  . amLODipine (NORVASC) 10 MG tablet Take 10 mg by mouth daily.  3  . aspirin EC 81 MG tablet Take 1 tablet (81 mg total) by mouth daily.    . ATORVASTATIN CALCIUM PO Take 40 mg by mouth. Take one tablet by mouth every 4th day    . carvedilol (COREG) 3.125 MG tablet Take 1 tablet (3.125 mg total) by mouth 2 (two) times daily. 60 tablet 6  . lisinopril (PRINIVIL,ZESTRIL) 40 MG tablet Take 1 tablet (40 mg total) by mouth daily. 30 tablet 6  . NITROSTAT 0.4 MG SL tablet Place 1 tablet under the tongue every 5 (five) minutes x 3 doses as needed.     No current facility-administered medications for this visit.     ROS:   General:  No weight loss, Fever, chills  HEENT: No recent headaches, no nasal bleeding, no visual changes, no sore throat  Neurologic: No dizziness, blackouts, seizures. No recent symptoms of stroke or mini- stroke. No recent episodes of slurred speech, or temporary blindness.  Cardiac: No recent episodes of chest pain/pressure, no shortness of breath at rest.  No shortness of breath with exertion.  Denies history of atrial fibrillation or irregular heartbeat  Vascular: No history of rest pain in feet.  No history of claudication.  No history of non-healing ulcer, No history of DVT   Pulmonary: No home oxygen, no productive cough, no hemoptysis,  No asthma or wheezing  Musculoskeletal:  [ ]  Arthritis, [ ]  Low back pain,  [ ]  Joint pain  Hematologic:No history of hypercoagulable state.  No history of easy bleeding.  No history of anemia  Gastrointestinal: No hematochezia or melena,  No gastroesophageal reflux, no trouble swallowing  Urinary: [ ]  chronic Kidney disease, [ ]  on HD - [ ]  MWF or [ ]  TTHS, [ ]  Burning with urination, [ ]  Frequent urination, [ ]  Difficulty urinating;   Skin: No  rashes  Psychological: No history of anxiety,  No history of depression   Physical Examination  Vitals:   06/03/17 1526  BP: (!) 139/94  Pulse: (!) 57  Resp: 20  Temp: 97.6 F (36.4 C)  TempSrc: Oral  SpO2: 92%  Weight: 199 lb 9.6 oz (90.5 kg)  Height: 5\' 9"  (1.753 m)    Body mass index is 29.48 kg/m.  General:  Alert and oriented, no acute distress HEENT: Normal Neck: No bruit or JVD Pulmonary: Clear to auscultation bilaterally Cardiac: Regular Rate and Rhythm without murmur Abdomen: Soft, non-tender, non-distended, no mass, Well-healed periumbilical scar possible recurrent umbilical hernia Skin: No rash Extremity Pulses:  2+ radial, brachial, femoral, dorsalis pedis bilaterally, 2+ right posterior tibial pulse  absent left posterior tibial pulse Musculoskeletal: No deformity or edema  Neurologic: Upper and lower extremity motor 5/5 and symmetric  DATA:  I reviewed the patient's CT scan of the abdomen and pelvis from June 2018. This shows a 5.4 cm infrarenal abdominal aortic aneurysm. The right common iliac artery is 2.3 cm in diameter. 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  ASSESSMENT:  Abdominal aortic aneurysm 5.4 cm in diameter. Not a candidate for stent graft repair and will need open abdominal aortic aneurysm repair. Potentially would address the common iliac artery at the same time depending on how this looks in the operating room. I would not plan on addressing the right renal artery aneurysm as this is quite small.  PLAN:  Open abdominal aortic aneurysm repair scheduled for 06/28/2017. Risks benefits possible palpitations and procedure details were discussed the patient and his daughter today. These include but are not limited to bleeding infection transfusion risk of 20% death myocardial events. Understanding and agree to proceed.   [name], MD [facility] [place] Office: [phone] Pager: [phone]

## 2017-06-28 NOTE — Op Note (Signed)
Procedure: Repair of infrarenal abdominal aortic aneurysm  Preoperative diagnosis: Abdominal aortic aneurysm  Postoperative diagnosis: Same  Anesthesia: Gen.  Assistant: Servando Snare MD, Gerri Lins PA-C  Operative findings: 20 mm Dacron graft from just below take off of renal arteries to the aortic bifurcation  Operative details: After obtaining informed consent, the patient was taken to the operating room. The patient was placed in supine position on the operating room table. After induction of general anesthesia and endotracheal intubation, a Foley catheter and nasogastric tube was placed. Next the patient was prepped and draped in usual sterile fashion from the nipples to the knees. A midline laparotomy incision was made extending from the xiphoid down to the pubis. Incision was carried into the subcutaneous tissues and through the fascia and peritoneum. There were some omental adhesions to the lower portion of the left side of the abdomen lateral to the umbilicus and these are taken down with cautery.  There was suture material in this area in what appeared to be a prior port side.  This was repaired with a #1 vicryl figure of 8 stitch.  The transverse colon was mobilized superiorly the small bowel and duodenum were reflected to the right. The retroperitoneum was entered. The Omni retractor was used to assist in retraction. Dissection was carried up to the level of the left renal vein. A suitable area for clamping on the infrarenal portion of the aorta was dissected free. The left renal and right renal arteries were identified on the inferior portion. Dissection was then carried down to level of the inferior mesenteric artery. This was dissected free circumferentially and a vessel loop placed around it. Dissection was then carried down to the aortic bifurcation. The left and right common iliac arteries were dissected free on their anterior 2/3 surface. The patient was given 9000 units of intravenous  heparin. The patient was given an additional 4500 units of heparin the course of the case. The left and right common iliac arteries were controlled with Henley clamps. An aortic DeBakey clamp was used to control the aorta just below the level of the renal arteries. The aneurysm was entered with cautery. A large amount of thrombus was removed. There was a large amount of laminated thrombus material and as much as possible was evacuated and thoroughly irrigated with normal saline solution. Several lumbar arteries were ligated with a figure-of-eight suture. The proximal neck of aorta was fashioned and a 20 mm Dacron graft properly operative field and sewn end-to-end to this using a running 3-0 Prolene suture. At completion of the anastomosis this was tested and there was one leak along the patient's right side this was repaired with 2 pledgeted 3-0 Prolene sutures. The aortic clamp was then moved down below the level anastomosis. The graft was then cut for length to the distal anastomosis. The distal aorta was fashioned and prepared for anastomosis. Graft was then sewn end-to-end to the aortic bifurcation using a running 3-0 Prolene suture. Just prior to completion of the anastomosis everything was thoroughly forebled backbled and thoroughly flushed from the iliacs and aorta. The anastomosis was secured clamps released there is initial pressure drop on unclamping of the right common iliac artery and this quickly recovered. On unclamping the left common iliac artery was also brief pressure drop and this quickly recovered. Two repair stitches was placed on the posterior left wall of the distal anastomosis. Heparin was reversed with 50 mg of protamine. Hemostasis was obtained. The aortic wall was reapproximated using a running  3-0 Vicryl suture. The retroperitoneum was closed so there was adequate coverage of the suture lines using again a running 3-0 Vicryl suture. The viscera return to their normal position the Omni  retractor was removed. The small bowel transverse and sigmoid colon were all viable. There was pulsatile bleeding from the inferior mesenteric artery before closing the aortic sac. Therefore this was ligated.  Next, the fascia was reapproximated with a running #1 PDS suture. The skin was then closed with a 3 0 vicryl subcuticular stitch. Instrument sponge and needle counts were correct at the end of the case. The patient was taken to the PACU in stable condition.   [name], MD  [facility]  Office: [phone]  Pager: [phone]

## 2017-06-28 NOTE — Plan of Care (Signed)
Problem: Pain Managment: Goal: General experience of comfort will improve Outcome: Progressing Patient's pain has improved significantly since admission to the unit  Problem: Physical Regulation: Goal: Ability to maintain clinical measurements within normal limits will improve Outcome: Progressing Patient's vitals remain stable   Problem: Urinary Elimination: Goal: Ability to achieve and maintain adequate renal perfusion and functioning will improve Outcome: Progressing Urine output is appropriate

## 2017-06-28 NOTE — Anesthesia Postprocedure Evaluation (Signed)
Anesthesia Post Note  Patient: Nicolas Butler  Procedure(s) Performed: ANEURYSM ABDOMINAL AORTIC REPAIR OPEN (N/A Abdomen)     Patient location during evaluation: PACU Anesthesia Type: General Level of consciousness: sedated Pain management: pain level controlled Vital Signs Assessment: post-procedure vital signs reviewed and stable Respiratory status: spontaneous breathing and respiratory function stable Cardiovascular status: stable Postop Assessment: no apparent nausea or vomiting Anesthetic complications: no    Last Vitals:  Vitals:   06/28/17 1245 06/28/17 1257  BP:    Pulse: 60 (!) 59  Resp: 13 14  Temp:  36.7 C  SpO2: 99% 100%    Last Pain:  Vitals:   06/28/17 1257  TempSrc:   PainSc: 2                  [name] DANIEL

## 2017-06-28 NOTE — Anesthesia Procedure Notes (Addendum)
Procedure Name: Intubation Date/Time: 06/28/2017 7:43 AM Performed by: Bryson Corona Pre-anesthesia Checklist: Patient identified, Emergency Drugs available, Suction available and Patient being monitored Patient Re-evaluated:Patient Re-evaluated prior to induction Oxygen Delivery Method: Circle System Utilized Preoxygenation: Pre-oxygenation with 100% oxygen Induction Type: IV induction Ventilation: Mask ventilation without difficulty Laryngoscope Size: Mac and 4 Grade View: Grade II Tube type: Oral Tube size: 8.0 mm Number of attempts: 1 Airway Equipment and Method: Stylet Placement Confirmation: ETT inserted through vocal cords under direct vision,  positive ETCO2 and breath sounds checked- equal and bilateral Secured at: 23 cm Tube secured with: Tape Dental Injury: Teeth and Oropharynx as per pre-operative assessment

## 2017-06-28 NOTE — Anesthesia Procedure Notes (Signed)
Central Venous Catheter Insertion Performed by: Duane Boston, anesthesiologist Start/End10/22/2018 7:02 AM, 06/28/2017 7:12 AM Patient location: Pre-op. Preanesthetic checklist: patient identified, IV checked, site marked, risks and benefits discussed, surgical consent, monitors and equipment checked, pre-op evaluation, timeout performed and anesthesia consent Position: Trendelenburg Lidocaine 1% used for infiltration and patient sedated Hand hygiene performed , maximum sterile barriers used  and Seldinger technique used Catheter size: 8 Fr Total catheter length 16. Central line was placed.Double lumen Procedure performed using ultrasound guided technique. Ultrasound Notes:anatomy identified, needle tip was noted to be adjacent to the nerve/plexus identified, no ultrasound evidence of intravascular and/or intraneural injection and image(s) printed for medical record Attempts: 2 Following insertion, dressing applied, line sutured and Biopatch. Post procedure assessment: blood return through all ports, free fluid flow and no air  Patient tolerated the procedure well with no immediate complications. Additional procedure comments: Attempted on Right IJ, able to cannulate the vessel, but wire would not pass.  Moved to the left side.Marland Kitchen

## 2017-06-28 NOTE — Anesthesia Procedure Notes (Signed)
Arterial Line Insertion Start/End10/22/2018 7:20 AM, 06/28/2017 7:25 AM Performed by: Merrilyn Puma B, CRNA  Patient location: Pre-op. Preanesthetic checklist: patient identified, IV checked, site marked, risks and benefits discussed, surgical consent, monitors and equipment checked, pre-op evaluation, timeout performed and anesthesia consent Lidocaine 1% used for infiltration radial was placed Catheter size: 20 G Hand hygiene performed , maximum sterile barriers used  and Seldinger technique used  Attempts: 1 Procedure performed without using ultrasound guided technique. Following insertion, dressing applied and Biopatch. Post procedure assessment: normal  Patient tolerated the procedure well with no immediate complications.

## 2017-06-28 NOTE — Progress Notes (Signed)
Pt in PACU extubated Still a little sleepy Abdomen soft 2+ DP pulses To ICU when bed available  [name], MD [facility] [place]: [phone] Pager: [phone]

## 2017-06-28 NOTE — Interval H&P Note (Signed)
History and Physical Interval Note:  06/28/2017 7:22 AM  Nicolas Butler  has presented today for surgery, with the diagnosis of Abdominal Aortic Aneurysm   I71.4  The various methods of treatment have been discussed with the patient and family. After consideration of risks, benefits and other options for treatment, the patient has consented to  Procedure(s): ANEURYSM ABDOMINAL AORTIC REPAIR OPEN (N/A) as a surgical intervention .  The patient's history has been reviewed, patient examined, no change in status, stable for surgery.  I have reviewed the patient's chart and labs.  Questions were answered to the patient's satisfaction.     [name]

## 2017-06-29 ENCOUNTER — Encounter (HOSPITAL_COMMUNITY): Payer: Self-pay | Admitting: Vascular Surgery

## 2017-06-29 ENCOUNTER — Inpatient Hospital Stay (HOSPITAL_COMMUNITY): Payer: PPO

## 2017-06-29 LAB — POCT I-STAT 7, (LYTES, BLD GAS, ICA,H+H)
Acid-Base Excess: 1 mmol/L (ref 0.0–2.0)
Acid-Base Excess: 1 mmol/L (ref 0.0–2.0)
BICARBONATE: 27.5 mmol/L (ref 20.0–28.0)
BICARBONATE: 25.8 mmol/L (ref 20.0–28.0)
Bicarbonate: 26.9 mmol/L (ref 20.0–28.0)
CALCIUM ION: 1.21 mmol/L (ref 1.15–1.40)
CALCIUM ION: 1.15 mmol/L (ref 1.15–1.40)
Calcium, Ion: 1.09 mmol/L — ABNORMAL LOW (ref 1.15–1.40)
HCT: 41 % (ref 39.0–52.0)
HCT: 37 % — ABNORMAL LOW (ref 39.0–52.0)
HEMATOCRIT: 37 % — AB (ref 39.0–52.0)
HEMOGLOBIN: 12.6 g/dL — AB (ref 13.0–17.0)
HEMOGLOBIN: 12.6 g/dL — AB (ref 13.0–17.0)
Hemoglobin: 13.9 g/dL (ref 13.0–17.0)
O2 SAT: 99 %
O2 Saturation: 99 %
O2 Saturation: 100 %
PCO2 ART: 49.9 mmHg — AB (ref 32.0–48.0)
PH ART: 7.377 (ref 7.350–7.450)
PO2 ART: 143 mmHg — AB (ref 83.0–108.0)
PO2 ART: 171 mmHg — AB (ref 83.0–108.0)
POTASSIUM: 4.6 mmol/L (ref 3.5–5.1)
POTASSIUM: 4.6 mmol/L (ref 3.5–5.1)
Potassium: 4.7 mmol/L (ref 3.5–5.1)
SODIUM: 139 mmol/L (ref 135–145)
SODIUM: 139 mmol/L (ref 135–145)
SODIUM: 140 mmol/L (ref 135–145)
TCO2: 29 mmol/L (ref 22–32)
TCO2: 28 mmol/L (ref 22–32)
TCO2: 27 mmol/L (ref 22–32)
pCO2 arterial: 48.4 mmHg — ABNORMAL HIGH (ref 32.0–48.0)
pCO2 arterial: 43.9 mmHg (ref 32.0–48.0)
pH, Arterial: 7.349 — ABNORMAL LOW (ref 7.350–7.450)
pH, Arterial: 7.354 (ref 7.350–7.450)
pO2, Arterial: 188 mmHg — ABNORMAL HIGH (ref 83.0–108.0)

## 2017-06-29 LAB — CBC
HCT: 38.7 % — ABNORMAL LOW (ref 39.0–52.0)
Hemoglobin: 13.4 g/dL (ref 13.0–17.0)
MCH: 32.2 pg (ref 26.0–34.0)
MCHC: 34.6 g/dL (ref 30.0–36.0)
MCV: 93 fL (ref 78.0–100.0)
Platelets: 175 10*3/uL (ref 150–400)
RBC: 4.16 MIL/uL — ABNORMAL LOW (ref 4.22–5.81)
RDW: 12.6 % (ref 11.5–15.5)
WBC: 12.7 10*3/uL — AB (ref 4.0–10.5)

## 2017-06-29 LAB — COMPREHENSIVE METABOLIC PANEL
ALBUMIN: 3.4 g/dL — AB (ref 3.5–5.0)
ALK PHOS: 58 U/L (ref 38–126)
ALT: 18 U/L (ref 17–63)
AST: 23 U/L (ref 15–41)
Anion gap: 6 (ref 5–15)
BILIRUBIN TOTAL: 0.8 mg/dL (ref 0.3–1.2)
BUN: 23 mg/dL — AB (ref 6–20)
CALCIUM: 7.9 mg/dL — AB (ref 8.9–10.3)
CO2: 24 mmol/L (ref 22–32)
CREATININE: 1.78 mg/dL — AB (ref 0.61–1.24)
Chloride: 103 mmol/L (ref 101–111)
GFR calc Af Amer: 44 mL/min — ABNORMAL LOW (ref >60)
GFR, EST NON AFRICAN AMERICAN: 38 mL/min — AB (ref >60)
GLUCOSE: 166 mg/dL — AB (ref 65–99)
Potassium: 4.6 mmol/L (ref 3.5–5.1)
Sodium: 133 mmol/L — ABNORMAL LOW (ref 135–145)
TOTAL PROTEIN: 5.9 g/dL — AB (ref 6.5–8.1)

## 2017-06-29 LAB — BLOOD GAS, ARTERIAL
Acid-base deficit: 0.5 mmol/L (ref 0.0–2.0)
Bicarbonate: 24.3 mmol/L (ref 20.0–28.0)
O2 Content: 2 L/min
O2 SAT: 96 %
PCO2 ART: 44.3 mmHg (ref 32.0–48.0)
PH ART: 7.358 (ref 7.350–7.450)
PO2 ART: 88.8 mmHg (ref 83.0–108.0)
Patient temperature: 98.6

## 2017-06-29 LAB — MAGNESIUM: MAGNESIUM: 1.9 mg/dL (ref 1.7–2.4)

## 2017-06-29 LAB — AMYLASE: Amylase: 52 U/L (ref 28–100)

## 2017-06-29 MED ORDER — PANTOPRAZOLE SODIUM 40 MG IV SOLR: 40.0000 mg | INTRAVENOUS | Status: DC

## 2017-06-29 MED ORDER — POTASSIUM CHLORIDE CRYS ER 20 MEQ PO TBCR
20.0000 meq | EXTENDED_RELEASE_TABLET | Freq: Every day | ORAL | Status: DC | PRN
Start: 2017-06-29 — End: 2017-07-06

## 2017-06-29 MED ORDER — PANTOPRAZOLE SODIUM 40 MG IV SOLR
40.0000 mg | INTRAVENOUS | Status: DC
  Administered 2017-06-30 – 2017-07-01 (×2): 40 mg via INTRAVENOUS
  Filled 2017-06-29 (×2): qty 40

## 2017-06-29 MED ORDER — PANTOPRAZOLE SODIUM 40 MG PO TBEC
40.0000 mg | DELAYED_RELEASE_TABLET | Freq: Every day | ORAL | Status: DC
  Administered 2017-07-02 – 2017-07-06 (×5): 40 mg via ORAL
  Filled 2017-06-29 (×5): qty 1

## 2017-06-29 MED ORDER — SODIUM CHLORIDE 0.9 % IV SOLN
Freq: Once | INTRAVENOUS | Status: AC
  Administered 2017-06-29: 10:00:00 via INTRAVENOUS

## 2017-06-29 MED FILL — Sodium Chloride IV Soln 0.9%: INTRAVENOUS | Qty: 2000 | Status: AC

## 2017-06-29 MED FILL — Heparin Sodium (Porcine) Inj 1000 Unit/ML: INTRAMUSCULAR | Qty: 30 | Status: AC

## 2017-06-29 NOTE — Discharge Instructions (Signed)
[facility]  Discharge Instructions   Open Aortic Surgery  Please refer to the following instructions for your post-procedure care. Your surgeon or Physician Assistant will discuss any changes with you.  Activity  Avoid lifting more than eight pounds (a gallon of milk) until after your first post-operative visit. You are encouraged to walk as much as you can. You can slowly return to normal activities but must avoid strenuous activity and heavy lifting until your doctor tells you it's OK. Heavy lifting can hurt the incision and cause a hernia. Avoid activities such as vacuuming or swinging a golf club. It is normal to feel tired for several weeks after your surgery. Do not drive until your doctor gives the OK and you are no longer taking prescription pain medications. It is also normal to have difficulty with sleep habits, eating and bowl movements after surgery. These will go away with time.  Bathing/Showering  You may shower after you go home. Do not soak in a bathtub, hot tub, or swim until the incision heals.  Incision Care  Shower every day. Clean your incision with mild soap and water. Pat the area dry with a clean towel. You do not need a bandage unless otherwise instructed. Do not apply any ointments or creams to your incision. You may have skin glue on your incision. Do not peel it off. It will come off on its own in about one week. If you have staples or sutures along your incision, they will be removed at your post op appointment.  If you have groin incisions, wash the groin wounds with soap and water daily and pat dry. (No tub bath-only shower)  Then put a dry gauze or washcloth in the groin to keep this area dry to help prevent wound infection.  Do this daily and as needed.  Do not use Vaseline or neosporin on your incisions.  Only use soap and water on your incisions and then protect and keep dry.  Diet  Resume your normal diet. There are no  special food restriction following this procedure. A low fat/low cholesterol diet is recommended for all patients with vascular disease. After your aortic surgery, it's normal to feel full faster than usual and to not feel as hungry as you normally would. You will probably lose weight initially following your surgery. It's best to eat small, frequent meals over the course of the day. Call the office if you find that you are unable to eat even small meals. In order to heal from your surgery, it is CRITICAL to get adequate nutrition. Your body requires vitamins, minerals, and protein. Vegetables are the best source of vitamins and minerals. causing pain, you may take over-the-counter pain reliever such as acetaminophen (Tylenol). If you were prescribed a stronger pain medication, please be aware these medication can cause nausea and constipation. Prevent nausea by taking the medication with a snack or meal. Avoid constipation by drinking plenty of fluids and eating foods with a high amount of fiber, such as fruits, vegetables and grains. Take 100mg  of the over-the-counter stool softener Colace twice a day as needed to help with constipation. A laxative, such as Milk of Magnesia, may be recommended for you at this time. Do not take a laxative unless your surgeon or P.A. tells you it's OK. Do not take Tylenol if you are taking stronger pain medications (such as Percocet).  Follow Up  Our office will schedule a follow up appointment 2-3 weeks after  discharge.  Please call us immediately for any of the following conditions    .     Severe or worsening pain in your legs or feet or in your abdomen back or chest. Increased pain, redness drainage (pus) from your incision site. Increased abdominal pain, bloating, nausea, vomiting, or persistent diarrhea. Fever of 101 degrees or higher. Swelling in your leg (s).  Reduce your risk of vascular disease  Stop smoking. If you would like help, call [facility] at  1-800-QUIT-NOW 984 343 0965) or [place] at [phone]. Manage your cholesterol Maintain a desired weight Control your diabetes Keep your blood pressure down  If you have any questions please call the office at [phone].

## 2017-06-29 NOTE — Progress Notes (Addendum)
[facility] [place]  Subjective  - Ambulated this am, was bathed, feeling pretty good over all.   Objective 125/78 69 97.7 F (36.5 C) (!) 22 95%  Intake/Output Summary (Last 24 hours) at 06/29/17 0941 Last data filed at 06/29/17 0800  Gross per 24 hour  Intake          6104.58 ml  Output             3695 ml  Net          2409.58 ml   Palpable DP 2+ Abdomin soft, incision Heart RRR Lungs non labored breathing    Assessment/Planning: POD # 1 Open Repair of infrarenal abdominal aortic aneurysm   A line has been D/C, 1 liter NS ordered Cr slightly elevated 1.78 UO 50-60 per hour Emesis NG out put 830 last 24 hours No flatus yet will maintain NG tube. Possible transfer to 4E later today  Laurence Slate [facility] 06/29/2017 9:41 AM --  Agree with above.  Keep NG for now Keep foley for today since creatinine slightly elevated D/c central line Transfer to telemetry  [name], MD [facility] [place]: [phone] Pager: [phone]   Laboratory Lab Results:  Recent Labs  06/28/17 1205 06/29/17 0441  WBC 17.4* 12.7*  HGB 14.2 13.4  HCT 41.0 38.7*  PLT 162 175   BMET  Recent Labs  06/28/17 1205 06/29/17 0441  NA 135 133*  K 4.7 4.6  CL 106 103  CO2 23 24  GLUCOSE 152* 166*  BUN 18 23*  CREATININE 1.51* 1.78*  CALCIUM 8.0* 7.9*    COAG Lab Results  Component Value Date   INR 1.22 06/28/2017   INR 1.03 06/24/2017   No results found for: PTT

## 2017-06-29 NOTE — Progress Notes (Signed)
PT Cancellation Note  Patient Details Name: KAJ VASIL MRN: [id number] DOB: Dec 17, 1950   Cancelled Treatment:    Reason Eval/Treat Not Completed: Other (comment) (Pt just walked around unit w/ nsg.  Will check back as able.)   Denice Paradise 06/29/2017, 9:58 AM [facility] [phone] (pager)

## 2017-06-29 NOTE — Care Management Note (Addendum)
Case Management Note  Patient Details  Name: DONAVYN FECHER MRN: [id number] Date of Birth: 05/14/1951  Subjective/Objective:     From home alone, pta indep POD # 1 Open Repair of infrarenalabdominal aortic aneurysm , he has PCP and medication coverage, he did not use any assistive devices at home, his daughter works here as a Therapist, sports also.  He states she will be checking on him after discharge, but he will not have anyone staying with him.  He has been ambulating,  PT eval ordered, await recs.                 Action/Plan: NCM will follow for dc needs.   Expected Discharge Date:                  Expected Discharge Plan:  [place]  In-House Referral:     Discharge planning Services  CM Consult  Post Acute Care Choice:    Choice offered to:     DME Arranged:    DME Agency:     HH Arranged:    [place] Agency:     Status of Service:  In process, will continue to follow  If discussed at Long Length of Stay Meetings, dates discussed:    Additional Comments:  Zenon Mayo, RN 06/29/2017, 12:58 PM

## 2017-06-29 NOTE — Progress Notes (Signed)
Patient was in his bed with charge nurse helping him out. No family member on-site. Patient was very receptive and appreciative of my visitation. Patient acknowledged that he was baptist and had lots of testimonies of Jesus' faithfulness to his entire life. Chaplain provided emotional support, reflective listening and compassionate presence. Patient will go through the AD paperwork and call back to finalize if he chooses to go a head.  Chaplain Clevland Cork.

## 2017-06-29 NOTE — Evaluation (Signed)
Occupational Therapy Evaluation Patient Details Name: Nicolas Butler MRN: [id number] DOB: 11/10/1950 Today's Date: 06/29/2017    History of Present Illness Pt is a 66 y.o. male s/p open abdominal aortic aneurysm repair. PMH significant for AAA and unspecified essential hypertension. Past surgical history includes: hand surgery, incisional hernia repair, and umbilical hernia repair.    Clinical Impression   PTA, pt was independent with ADL and functional mobility and living alone. Pt currently requires max assist for LB ADL, min assist for UB ADL, and min assist for functional mobility during toilet transfers. He was limited by pain this session but highly motivated to return home and regain independence. Pt would benefit from continued OT services while admitted to improve independence and safety with ADL and functional mobility prior to returning home. Recommend initial 24 hour assistance post-acute D/C.    Follow Up Recommendations  No OT follow up;Supervision/Assistance - 24 hour    Equipment Recommendations  3 in 1 bedside commode    Recommendations for Other Services       Precautions / Restrictions Restrictions Weight Bearing Restrictions: Yes      Mobility Bed Mobility Overal bed mobility: Needs Assistance Bed Mobility: Rolling;Sit to Sidelying Rolling: Min guard       Sit to sidelying: Mod assist General bed mobility comments: Mod assist to return B LE to bed and for rolling technique. Pt tends to "flop" back onto bed.   Transfers Overall transfer level: Needs assistance   Transfers: Sit to/from Stand Sit to Stand: Min assist         General transfer comment: Min assist for safety with RW.     Balance Overall balance assessment: Needs assistance Sitting-balance support: No upper extremity supported;Feet supported Sitting balance-Leahy Scale: Good     Standing balance support: Bilateral upper extremity supported;No upper extremity supported;During  functional activity Standing balance-Leahy Scale: Fair                             ADL either performed or assessed with clinical judgement   ADL Overall ADL's : Needs assistance/impaired Eating/Feeding: Set up;Sitting   Grooming: Set up;Sitting   Upper Body Bathing: Sitting;Minimal assistance   Lower Body Bathing: Maximal assistance;Sit to/from stand   Upper Body Dressing : Minimal assistance;Sitting   Lower Body Dressing: Maximal assistance;Sit to/from stand   Toilet Transfer: Minimal assistance;Ambulation;RW   Toileting- Clothing Manipulation and Hygiene: Maximal assistance;Sit to/from stand       Functional mobility during ADLs: Minimal assistance;Rolling walker General ADL Comments: Pt able to ambulate throughout unit on RA with desaturation to 85% momentarily with quick rebound to upper 90's with pursed lip breathing techniques.       Vision Patient Visual Report: No change from baseline Vision Assessment?: No apparent visual deficits     Perception     Praxis      Pertinent Vitals/Pain Pain Assessment: 0-10 Pain Score: 8  Pain Location: incision Pain Descriptors / Indicators: Aching;Discomfort;Operative site guarding Pain Intervention(s): Limited activity within patient's tolerance;Monitored during session;Patient requesting pain meds-RN notified     Hand Dominance     Extremity/Trunk Assessment Upper Extremity Assessment Upper Extremity Assessment: Overall WFL for tasks assessed   Lower Extremity Assessment Lower Extremity Assessment: Defer to PT evaluation       Communication Communication Communication: No difficulties   Cognition Arousal/Alertness: Awake/alert Behavior During Therapy: WFL for tasks assessed/performed Overall Cognitive Status: Within Functional Limits for tasks assessed  General Comments  Daughter is RN on 2H    Exercises     Shoulder Instructions      Home  Living Family/patient expects to be discharged to:: Private residence Living Arrangements: Alone Available Help at Discharge: Family;Available PRN/intermittently (daughter to stay with pt initially; unsure if 24/7) Type of Home: House Home Access: Stairs to enter [employer] of Steps: 7         Bathroom Shower/Tub: Walk-in shower         Home Equipment: None   Additional Comments: Need to further clarify home set-up.       Prior Functioning/Environment Level of Independence: Independent        Comments: Very independent PTA        OT Problem List: Decreased activity tolerance;Impaired balance (sitting and/or standing);Decreased safety awareness;Decreased knowledge of use of DME or AE;Decreased knowledge of precautions;Pain      OT Treatment/Interventions: Self-care/ADL training;Therapeutic exercise;Energy conservation;DME and/or AE instruction;Therapeutic activities;Patient/family education;Balance training    OT Goals(Current goals can be found in the care plan section) Acute Rehab OT Goals Patient Stated Goal: to go home OT Goal Formulation: With patient/family Time For Goal Achievement: 07/13/17 Potential to Achieve Goals: Good ADL Goals Pt Will Perform Grooming: with modified independence;standing Pt Will Perform Upper Body Dressing: with modified independence;sitting Pt Will Perform Lower Body Dressing: with min assist;sit to/from stand Pt Will Transfer to Toilet: with modified independence;ambulating;bedside commode (BSC over toilet) Pt Will Perform Toileting - Clothing Manipulation and hygiene: sit to/from stand;with supervision Pt Will Perform Tub/Shower Transfer: Shower transfer;with min guard assist;ambulating;3 in 1;rolling walker  OT Frequency: Min 2X/week   Barriers to D/C:            Co-evaluation              AM-PAC PT "6 Clicks" Daily Activity     Outcome Measure Help from another person eating meals?: None Help from another  person taking care of personal grooming?: None Help from another person toileting, which includes using toliet, bedpan, or urinal?: A Lot Help from another person bathing (including washing, rinsing, drying)?: A Lot Help from another person to put on and taking off regular upper body clothing?: A Little Help from another person to put on and taking off regular lower body clothing?: A Lot 6 Click Score: 17   End of Session Equipment Utilized During Treatment: Gait belt;Rolling walker Nurse Communication: Mobility status  Activity Tolerance: Patient tolerated treatment well Patient left: in bed;with call bell/phone within reach;with nursing/sitter in room (with 1/2 L O2 via [place] re-applied)  OT Visit Diagnosis: Other abnormalities of gait and mobility (R26.89);Muscle weakness (generalized) (M62.81)                Time: 5883-2549 OT Time Calculation (min): 21 min Charges:  OT General Charges $OT Visit: 1 Visit OT Evaluation $OT Eval Moderate Complexity: 1 Mod G-Codes:     Norman Herrlich, MS OTR/L  Pager: [place] 06/29/2017, 4:59 PM

## 2017-06-30 LAB — CBC
HEMATOCRIT: 39.6 % (ref 39.0–52.0)
HEMOGLOBIN: 13.4 g/dL (ref 13.0–17.0)
MCH: 32.8 pg (ref 26.0–34.0)
MCHC: 33.8 g/dL (ref 30.0–36.0)
MCV: 96.8 fL (ref 78.0–100.0)
Platelets: 194 10*3/uL (ref 150–400)
RBC: 4.09 MIL/uL — ABNORMAL LOW (ref 4.22–5.81)
RDW: 13.3 % (ref 11.5–15.5)
WBC: 15.6 10*3/uL — ABNORMAL HIGH (ref 4.0–10.5)

## 2017-06-30 LAB — BASIC METABOLIC PANEL
Anion gap: 6 (ref 5–15)
BUN: 19 mg/dL (ref 6–20)
CO2: 26 mmol/L (ref 22–32)
Calcium: 8.2 mg/dL — ABNORMAL LOW (ref 8.9–10.3)
Chloride: 102 mmol/L (ref 101–111)
Creatinine, Ser: 1.62 mg/dL — ABNORMAL HIGH (ref 0.61–1.24)
GFR calc Af Amer: 49 mL/min — ABNORMAL LOW (ref >60)
GFR, EST NON AFRICAN AMERICAN: 43 mL/min — AB (ref >60)
GLUCOSE: 127 mg/dL — AB (ref 65–99)
POTASSIUM: 4.5 mmol/L (ref 3.5–5.1)
Sodium: 134 mmol/L — ABNORMAL LOW (ref 135–145)

## 2017-06-30 LAB — BPAM RBC
BLOOD PRODUCT EXPIRATION DATE: 201811152359
Blood Product Expiration Date: 201811192359
ISSUE DATE / TIME: 201810220651
ISSUE DATE / TIME: 201810220651
UNIT TYPE AND RH: 5100
Unit Type and Rh: 5100

## 2017-06-30 LAB — TYPE AND SCREEN
ABO/RH(D): O POS
Antibody Screen: NEGATIVE
UNIT DIVISION: 0
Unit division: 0

## 2017-06-30 NOTE — Consult Note (Signed)
   [facility] CM Inpatient Consult   06/30/2017  Nicolas Butler 22-Mar-1951 [id number]  Patient was sound asleep on round.  Patient reviewed in the [employer]/ACO.  Admitted for Abdominal Aortic Aneurysm repair. Spoke with inpatient RNCM regarding patient in the [place].  She states his daughter is a Therapist, sports and he was doing well, he lives alone and to have home health care. Will follow up and encouraged to call for needs.   For questions, or referral please contact:  Natividad Brood, RN BSN [facility] Liaison  [phone] business mobile phone Toll free office [phone]

## 2017-06-30 NOTE — Progress Notes (Signed)
Report called to Rn on 4E

## 2017-06-30 NOTE — Evaluation (Signed)
Physical Therapy Evaluation Patient Details Name: Nicolas Butler MRN: [id number] DOB: 08/27/1951 Today's Date: 06/30/2017   History of Present Illness  Pt is a 66 y.o. male s/p open abdominal aortic aneurysm repair. PMH significant for AAA and unspecified essential hypertension. Past surgical history includes: hand surgery, incisional hernia repair, and umbilical hernia repair.   Past Medical History:  Diagnosis Date  . AAA (abdominal aortic aneurysm) ([place])   . Chronic kidney disease (CKD), stage III (moderate) ([facility])   . High cholesterol   . Unspecified essential hypertension     Clinical Impression  Pt admitted with above diagnosis. Pt currently with functional limitations due to the deficits listed below (see PT Problem List). Pt was able to ambulate 390 feet with mod to min assist with EVa walker and cues to stand tall as pt tends to flex posture.  Pt limited due to postural stability and feel that Rehab stay to reach Modif I would benefit pt unless he has 24 hour care at d/c.   Pt will benefit from skilled PT to increase their independence and safety with mobility to allow discharge to the venue listed below.      Follow Up Recommendations Supervision/Assistance - 24 hour;CIR (If not 24 hour care initially will need Rehab)    Equipment Recommendations  Rolling walker with 5" wheels;3in1 (PT) (most likely will need these)    Recommendations for Other Services       Precautions / Restrictions Precautions Precautions: Fall Restrictions Weight Bearing Restrictions: Yes (abdomen precautions )      Mobility  Bed Mobility Overal bed mobility: Needs Assistance Bed Mobility: Rolling;Sidelying to Sit Rolling: Min guard Sidelying to sit: Mod assist       General bed mobility comments: Needed assist for technique for side to sit.  Had difficulty processing how to do.  On arrival, pt asked to use urinal.  Pt used once on EOB.  Transfers Overall transfer level: Needs  assistance Equipment used:  [place] walker) Transfers: Sit to/from Stand Sit to Stand: Min assist         General transfer comment: Min assist for safety to [employer] walker for cues for hand placement and power up  Ambulation/Gait Ambulation/Gait assistance: Min assist Ambulation Distance (Feet): 390 Feet Assistive device:  (Eva walker) Gait Pattern/deviations: Step-through pattern;Decreased stride length;Trunk flexed;Wide base of support;Staggering left;Staggering right;Drifts right/left   Gait velocity interpretation: Below normal speed for age/gender General Gait Details: Pt needed constant cues for upright posture.  Pt kept flexing trunk.  Pt needed cues to tuck abdomen in and stay close to [facility] walker.  Pt also needed cues to slow down.  VSS. Needed assist to steer [place] walker and slow it down at times. Pt asked to use urinal again at end of treatment.   Stairs            Wheelchair Mobility    Modified Rankin (Stroke Patients Only)       Balance Overall balance assessment: Needs assistance Sitting-balance support: No upper extremity supported;Feet supported Sitting balance-Leahy Scale: Fair     Standing balance support: Bilateral upper extremity supported;During functional activity Standing balance-Leahy Scale: Poor Standing balance comment: relies on UE suppport for balance                             Pertinent Vitals/Pain Pain Assessment: Faces Faces Pain Scale: Hurts little more Pain Location: incision Pain Descriptors / Indicators: Aching;Discomfort;Operative site guarding Pain Intervention(s):  Limited activity within patient's tolerance;Monitored during session;Repositioned  VSS  Home Living Family/patient expects to be discharged to:: Private residence Living Arrangements: Alone Available Help at Discharge: Family;Available PRN/intermittently (daughter to stay with pt initially; unsure if 24/7) Type of Home: House Home Access: Stairs to enter    [employer] of Steps: 7   Home Equipment: None Additional Comments: Need to further clarify home set-up.     Prior Function Level of Independence: Independent         Comments: Very independent PTA     Hand Dominance        Extremity/Trunk Assessment   Upper Extremity Assessment Upper Extremity Assessment: Defer to OT evaluation    Lower Extremity Assessment Lower Extremity Assessment: Generalized weakness    Cervical / Trunk Assessment Cervical / Trunk Assessment: Kyphotic  Communication   Communication: No difficulties  Cognition Arousal/Alertness: Awake/alert Behavior During Therapy: WFL for tasks assessed/performed Overall Cognitive Status: Within Functional Limits for tasks assessed                                        General Comments      Exercises     Assessment/Plan    PT Assessment Patient needs continued PT services  PT Problem List Decreased activity tolerance;Decreased balance;Decreased mobility;Decreased knowledge of use of DME;Decreased safety awareness;Decreased knowledge of precautions;Pain       PT Treatment Interventions DME instruction;Gait training;Functional mobility training;Therapeutic activities;Therapeutic exercise;Balance training;Patient/family education;Stair training    PT Goals (Current goals can be found in the Care Plan section)  Acute Rehab PT Goals Patient Stated Goal: to go home PT Goal Formulation: With patient Time For Goal Achievement: 07/14/17 Potential to Achieve Goals: Good    Frequency Min 3X/week   Barriers to discharge Other (comment) (unsure of caregiver support)      Co-evaluation               AM-PAC PT "6 Clicks" Daily Activity  Outcome Measure Difficulty turning over in bed (including adjusting bedclothes, sheets and blankets)?: A Little Difficulty moving from lying on back to sitting on the side of the bed? : A Lot Difficulty sitting down on and standing up  from a chair with arms (e.g., wheelchair, bedside commode, etc,.)?: A Lot Help needed moving to and from a bed to chair (including a wheelchair)?: A Lot Help needed walking in hospital room?: A Lot Help needed climbing 3-5 steps with a railing? : Total 6 Click Score: 12    End of Session Equipment Utilized During Treatment: Gait belt Activity Tolerance: Patient limited by fatigue Patient left: in chair;with call bell/phone within reach Nurse Communication: Mobility status PT Visit Diagnosis: Unsteadiness on feet (R26.81);Muscle weakness (generalized) (M62.81);Pain Pain - part of body:  (incision)    Time: 0867-6195 PT Time Calculation (min) (ACUTE ONLY): 25 min   Charges:   PT Evaluation $PT Eval Moderate Complexity: 1 Mod PT Treatments $Gait Training: 8-22 mins   PT G Codes:        Shay Jhaveri,PT Acute Rehabilitation [phone] (pager)   Denice Paradise 06/30/2017, 2:09 PM

## 2017-06-30 NOTE — Progress Notes (Signed)
Patient arrived to 4E room 18 from [facility] after AAA repair.  NG tube in place to intermittent suction.  Telemetry monitor applied and CCMD notified.  Patient oriented to unit and room to include call light and phone.  Will continue to monitor.

## 2017-06-30 NOTE — Progress Notes (Signed)
Rehab Admissions Coordinator Note:  Patient was screened by Retta Diones for appropriateness for an Inpatient Acute Rehab Consult.  At this time, we are recommending Inpatient Rehab consult.  Jodell Cipro M 06/30/2017, 2:23 PM  I can be reached at [phone].

## 2017-06-30 NOTE — Progress Notes (Addendum)
  AAA Progress Note    06/30/2017 8:40 AM 2 Days Post-Op  Subjective:  Sitting in chair; no distress; difficult time coughing even with heart pillow  Afebrile HR 60's-90's NSR 300'T-622'Q systolic 33% RA  Vitals:   06/30/17 0744 06/30/17 0800  BP:  138/83  Pulse:  83  Resp:  20  Temp: 97.9 F (36.6 C)   SpO2:  93%    Physical Exam: Cardiac:  regular Lungs:  Somewhat coarse at bases, otherwise clear Abdomen:  Soft, NT/ND; minimal BS; -flatus; -BM Incisions:  Laparotomy incision is clean and dry Extremities:  +palpable DP pulses bilaterally  CBC    Component Value Date/Time   WBC 12.7 (H) 06/29/2017 0441   RBC 4.16 (L) 06/29/2017 0441   HGB 13.4 06/29/2017 0441   HCT 38.7 (L) 06/29/2017 0441   PLT 175 06/29/2017 0441   MCV 93.0 06/29/2017 0441   MCH 32.2 06/29/2017 0441   MCHC 34.6 06/29/2017 0441   RDW 12.6 06/29/2017 0441    BMET    Component Value Date/Time   NA 133 (L) 06/29/2017 0441   K 4.6 06/29/2017 0441   CL 103 06/29/2017 0441   CO2 24 06/29/2017 0441   GLUCOSE 166 (H) 06/29/2017 0441   BUN 23 (H) 06/29/2017 0441   CREATININE 1.78 (H) 06/29/2017 0441   CALCIUM 7.9 (L) 06/29/2017 0441   GFRNONAA 38 (L) 06/29/2017 0441   GFRAA 44 (L) 06/29/2017 0441    INR    Component Value Date/Time   INR 1.22 06/28/2017 1205     Intake/Output Summary (Last 24 hours) at 06/30/17 0840 Last data filed at 06/30/17 0600  Gross per 24 hour  Intake             2990 ml  Output             1675 ml  Net             1315 ml   NGT output:  300cc last shift (may have been total for day as RN is unsure and output is not recorded for yesterday).  Assessment/Plan:  66 y.o. male is s/p  Open Repair of infrarenalabdominal aortic aneurysm  2 Days Post-Op  -pt doing well this am-has palpable DP pulses bilaterally -encouraged pt to IS every hour instead of twice a day; he has ambulated in the halls -he has minimal BS and no flatus-will give dulcolax this am.   Continue npo an ambulation -will draw labs in the am to check renal function -foley out yesterday-pt with 1735cc/24hr UOP -continue NGT for now   Leontine Locket, PA-C [facility] [phone] 06/30/2017 8:40 AM  Agree with above.  Creatinine 1.6 today near baseline Continue IV fluids.  Hyponatremia if persists switch to normal saline IV tomorrow D/c NG and advance diet as gut function returns  [name], MD [facility] [place]: [phone] Pager: [phone]

## 2017-06-30 NOTE — Progress Notes (Signed)
[facility] [place] fills tight, no flatus yet.   Objective 138/83 83 97.9 F (36.6 C) (Oral) 20 93%  Intake/Output Summary (Last 24 hours) at 06/30/17 0820 Last data filed at 06/30/17 0600  Gross per 24 hour  Intake             2990 ml  Output             1675 ml  Net             1315 ml    Palpable DP 2+ B Abdomin soft, minimally distended in a seated position. Incision healing well Heart RRR Lungs non labored breathing  Assessment/Planning POD # 2 Open Repair of infrarenalabdominal aortic aneurysm   Labs ordered and pending for today  Will have RN give Dulcolax today NG tube maintain Out put 300 cc + UO improved    Jewelle Whitner MAUREEN 06/30/2017 8:20 AM --  Laboratory Lab Results:  Recent Labs  06/28/17 1205 06/29/17 0441  WBC 17.4* 12.7*  HGB 14.2 13.4  HCT 41.0 38.7*  PLT 162 175   BMET  Recent Labs  06/28/17 1205 06/29/17 0441  NA 135 133*  K 4.7 4.6  CL 106 103  CO2 23 24  GLUCOSE 152* 166*  BUN 18 23*  CREATININE 1.51* 1.78*  CALCIUM 8.0* 7.9*    COAG Lab Results  Component Value Date   INR 1.22 06/28/2017   INR 1.03 06/24/2017   No results found for: PTT

## 2017-07-01 LAB — CBC
HCT: 35.1 % — ABNORMAL LOW (ref 39.0–52.0)
HEMOGLOBIN: 11.8 g/dL — AB (ref 13.0–17.0)
MCH: 32.2 pg (ref 26.0–34.0)
MCHC: 33.6 g/dL (ref 30.0–36.0)
MCV: 95.9 fL (ref 78.0–100.0)
PLATELETS: 150 10*3/uL (ref 150–400)
RBC: 3.66 MIL/uL — AB (ref 4.22–5.81)
RDW: 12.8 % (ref 11.5–15.5)
WBC: 12.8 10*3/uL — AB (ref 4.0–10.5)

## 2017-07-01 LAB — BASIC METABOLIC PANEL
Anion gap: 7 (ref 5–15)
BUN: 14 mg/dL (ref 6–20)
CALCIUM: 8.1 mg/dL — AB (ref 8.9–10.3)
CO2: 24 mmol/L (ref 22–32)
CREATININE: 1.46 mg/dL — AB (ref 0.61–1.24)
Chloride: 101 mmol/L (ref 101–111)
GFR calc non Af Amer: 48 mL/min — ABNORMAL LOW (ref >60)
GFR, EST AFRICAN AMERICAN: 56 mL/min — AB (ref >60)
Glucose, Bld: 120 mg/dL — ABNORMAL HIGH (ref 65–99)
Potassium: 4.7 mmol/L (ref 3.5–5.1)
Sodium: 132 mmol/L — ABNORMAL LOW (ref 135–145)

## 2017-07-01 MED ORDER — KCL IN DEXTROSE-NACL 20-5-0.9 MEQ/L-%-% IV SOLN
INTRAVENOUS | Status: DC
  Administered 2017-07-02 (×2): via INTRAVENOUS
  Filled 2017-07-01 (×5): qty 1000

## 2017-07-01 NOTE — Progress Notes (Signed)
[facility] [place]  Subjective  - thinks he may have passed some flatus   Objective 134/70 79 99.1 F (37.3 C) (Oral) 19 93%  Intake/Output Summary (Last 24 hours) at 07/01/17 0432 Last data filed at 07/01/17 0324  Gross per 24 hour  Intake             1125 ml  Output             2715 ml  Net            -1590 ml   Abdomen soft incision clean 2+ DP pulses Bilious NG output  Assessment/Planning: POD #4 s/p AAA still with ileus.  Keep NG until volume decreases and more bowel function Continue IV fluid bmet from today still pending  [name] 07/01/2017 4:32 AM --  Laboratory Lab Results:  Recent Labs  06/30/17 0956 07/01/17 0306  WBC 15.6* 12.8*  HGB 13.4 11.8*  HCT 39.6 35.1*  PLT 194 150   BMET  Recent Labs  06/29/17 0441 06/30/17 0956  NA 133* 134*  K 4.6 4.5  CL 103 102  CO2 24 26  GLUCOSE 166* 127*  BUN 23* 19  CREATININE 1.78* 1.62*  CALCIUM 7.9* 8.2*    COAG Lab Results  Component Value Date   INR 1.22 06/28/2017   INR 1.03 06/24/2017   No results found for: PTT

## 2017-07-01 NOTE — Progress Notes (Signed)
OT Cancellation Note  Patient Details Name: Nicolas Butler MRN: [id number] DOB: Jan 28, 1951   Cancelled Treatment:    Reason Eval/Treat Not Completed: Other (comment). Into room and RN reports she just got pt back to bed. Asked pt about sitting up on EOB with me and doing some things and he said he really did not feel like it, he was tired from being up. We will follow up at a later time.  Almon Register [phone] 07/01/2017, 2:53 PM

## 2017-07-01 NOTE — Progress Notes (Signed)
PT Cancellation Note  Patient Details Name: Nicolas Butler MRN: [id number] DOB: September 28, 1950   Cancelled Treatment:    Reason Eval/Treat Not Completed: Pain limiting ability to participate Patient reported his pain level is too high to participate and RN notified of need for pain medication. PT will continue to follow acutely.    Salina April, PTA Pager: [phone]   07/01/2017, 4:02 PM

## 2017-07-01 NOTE — Progress Notes (Signed)
Pt has ambulated hall twice with daughter [facility] RN) using EVA walker.

## 2017-07-01 NOTE — Progress Notes (Signed)
PT Cancellation Note  Patient Details Name: Nicolas Butler MRN: [id number] DOB: 24-Nov-1950   Cancelled Treatment:    Reason Eval/Treat Not Completed: Patient declined, no reason specified Pt declined and reported that he ambulated with daughter about an hour ago. PT will check on pt later as time allows.    Sal[place] April, PTA Pager: [phone]   07/01/2017, 11:52 AM

## 2017-07-02 ENCOUNTER — Telehealth: Payer: Self-pay | Admitting: Vascular Surgery

## 2017-07-02 LAB — CBC
HEMATOCRIT: 33.4 % — AB (ref 39.0–52.0)
Hemoglobin: 11.6 g/dL — ABNORMAL LOW (ref 13.0–17.0)
MCH: 32.7 pg (ref 26.0–34.0)
MCHC: 34.7 g/dL (ref 30.0–36.0)
MCV: 94.1 fL (ref 78.0–100.0)
PLATELETS: 158 10*3/uL (ref 150–400)
RBC: 3.55 MIL/uL — ABNORMAL LOW (ref 4.22–5.81)
RDW: 12.5 % (ref 11.5–15.5)
WBC: 12.2 10*3/uL — AB (ref 4.0–10.5)

## 2017-07-02 LAB — BASIC METABOLIC PANEL
ANION GAP: 6 (ref 5–15)
BUN: 13 mg/dL (ref 6–20)
CALCIUM: 8.3 mg/dL — AB (ref 8.9–10.3)
CO2: 25 mmol/L (ref 22–32)
Chloride: 101 mmol/L (ref 101–111)
Creatinine, Ser: 1.31 mg/dL — ABNORMAL HIGH (ref 0.61–1.24)
GFR, EST NON AFRICAN AMERICAN: 55 mL/min — AB (ref >60)
Glucose, Bld: 111 mg/dL — ABNORMAL HIGH (ref 65–99)
Potassium: 4.1 mmol/L (ref 3.5–5.1)
Sodium: 132 mmol/L — ABNORMAL LOW (ref 135–145)

## 2017-07-02 MED ORDER — ATORVASTATIN CALCIUM 80 MG PO TABS
80.0000 mg | ORAL_TABLET | Freq: Every day | ORAL | Status: DC
  Administered 2017-07-02 – 2017-07-06 (×5): 80 mg via ORAL
  Filled 2017-07-02 (×5): qty 1

## 2017-07-02 MED ORDER — NITROGLYCERIN 0.4 MG SL SUBL: 0.4000 mg | SUBLINGUAL_TABLET | SUBLINGUAL | Status: DC | PRN

## 2017-07-02 MED ORDER — ASPIRIN EC 81 MG PO TBEC
81.0000 mg | DELAYED_RELEASE_TABLET | Freq: Every day | ORAL | Status: DC
  Administered 2017-07-02 – 2017-07-06 (×5): 81 mg via ORAL
  Filled 2017-07-02 (×5): qty 1

## 2017-07-02 MED ORDER — AMLODIPINE BESYLATE 10 MG PO TABS
10.0000 mg | ORAL_TABLET | Freq: Every day | ORAL | Status: DC
  Administered 2017-07-02 – 2017-07-06 (×5): 10 mg via ORAL
  Filled 2017-07-02 (×5): qty 1

## 2017-07-02 MED ORDER — CARVEDILOL 3.125 MG PO TABS
3.1250 mg | ORAL_TABLET | Freq: Two times a day (BID) | ORAL | Status: DC
  Administered 2017-07-02 – 2017-07-06 (×9): 3.125 mg via ORAL
  Filled 2017-07-02 (×9): qty 1

## 2017-07-02 MED ORDER — LISINOPRIL 40 MG PO TABS
40.0000 mg | ORAL_TABLET | Freq: Every day | ORAL | Status: DC
  Administered 2017-07-02 – 2017-07-06 (×5): 40 mg via ORAL
  Filled 2017-07-02 (×5): qty 1

## 2017-07-02 MED ORDER — OXYCODONE-ACETAMINOPHEN 5-325 MG PO TABS
1.0000 | ORAL_TABLET | ORAL | Status: DC | PRN
  Administered 2017-07-02: 1 via ORAL
  Administered 2017-07-02 (×2): 2 via ORAL
  Administered 2017-07-03: 1 via ORAL
  Administered 2017-07-03 – 2017-07-06 (×7): 2 via ORAL
  Filled 2017-07-02: qty 2
  Filled 2017-07-02: qty 1
  Filled 2017-07-02 (×7): qty 2
  Filled 2017-07-02: qty 1
  Filled 2017-07-02: qty 2

## 2017-07-02 NOTE — Plan of Care (Signed)
Problem: Bowel/Gastric: Goal: Gastrointestinal status for postoperative course will improve Outcome: Progressing Continue with current care plan

## 2017-07-02 NOTE — Telephone Encounter (Signed)
-----   Message from Mena Goes, RN sent at 07/01/2017  3:03 PM EDT ----- Regarding: 2-3 weeks post bypass    ----- Message ----- From: Gabriel Earing, PA-C Sent: 07/01/2017   2:23 PM To: Vvs Charge Pool  S/p aortobifem bypass graft.  F/u with Dr. Oneida Alar in 2-3 weeks.  Thanks

## 2017-07-02 NOTE — Progress Notes (Signed)
Occupational Therapy Treatment Patient Details Name: Nicolas Butler MRN: [id number] DOB: 06-09-1951 Today's Date: 07/02/2017    History of present illness Pt is a 66 y.o. male s/p open abdominal aortic aneurysm repair. PMH significant for AAA and unspecified essential hypertension. Past surgical history includes: hand surgery, incisional hernia repair, and umbilical hernia repair.    OT comments  Pt is making excellent progress toward goals.  He requires min guard - min A for ADLs and min guard assist to ambulate ~327ft.   His daughter will assist at discharge, but works long hours, so pt will need to be as independent as possible at discharge.  Recommend short CIR stay to allow pt to maximize independence and safety with ADLs, prevent falls, and reduce risk of readmission.    Follow Up Recommendations  CIR;Supervision/Assistance - 24 hour    Equipment Recommendations  3 in 1 bedside commode    Recommendations for Other Services      Precautions / Restrictions Precautions Precautions: Fall Restrictions Weight Bearing Restrictions: Yes (abdominal precautions)       Mobility Bed Mobility               General bed mobility comments: Pt sitting on EOB on arrival   Transfers Overall transfer level: Needs assistance Equipment used: Rolling walker (2 wheeled) Transfers: Sit to/from [facility] Sit to Stand: Min guard Stand pivot transfers: Min guard       General transfer comment: min guard for safety.  Cues for hand placement     Balance Overall balance assessment: Needs assistance Sitting-balance support: No upper extremity supported;Feet supported Sitting balance-Leahy Scale: Good     Standing balance support: During functional activity;No upper extremity supported Standing balance-Leahy Scale: Fair                             ADL either performed or assessed with clinical judgement   ADL Overall ADL's : Needs assistance/impaired     Grooming: Wash/dry hands;Wash/dry face;Min guard;Standing       Lower Body Bathing: Minimal assistance;Sit to/from stand       Lower Body Dressing: Sit to/from stand;Min guard Lower Body Dressing Details (indicate cue type and reason): Pt able to cross ankles over knees to don/doff socks  Toilet Transfer: Min guard;Ambulation;Comfort height toilet;Grab bars;RW   Toileting- Water quality scientist and Hygiene: Min guard;Sit to/from stand       Functional mobility during ADLs: Passenger transport manager     Praxis      Cognition Arousal/Alertness: Awake/alert Behavior During Therapy: WFL for tasks assessed/performed Overall Cognitive Status: Within Functional Limits for tasks assessed                                          Exercises     Shoulder Instructions       General Comments VSS     Pertinent Vitals/ Pain       Pain Assessment: Faces Pain Score: 6  Faces Pain Scale: Hurts little more Pain Location: incision Pain Descriptors / Indicators: Guarding;Grimacing;Sore Pain Intervention(s): Monitored during session  Home Living  Prior Functioning/Environment              Frequency  Min 2X/week        Progress Toward Goals  OT Goals(current goals can now be found in the care plan section)  Progress towards OT goals: Progressing toward goals  Acute Rehab OT Goals Patient Stated Goal: to go home  Plan Discharge plan needs to be updated    Co-evaluation                 AM-PAC PT "6 Clicks" Daily Activity     Outcome Measure   Help from another person eating meals?: None Help from another person taking care of personal grooming?: A Little Help from another person toileting, which includes using toliet, bedpan, or urinal?: A Little Help from another person bathing (including washing, rinsing, drying)?: A Little Help from another  person to put on and taking off regular upper body clothing?: A Little Help from another person to put on and taking off regular lower body clothing?: A Little 6 Click Score: 19    End of Session Equipment Utilized During Treatment: Rolling walker  OT Visit Diagnosis: Pain;Muscle weakness (generalized) (M62.81)   Activity Tolerance Patient tolerated treatment well   Patient Left in bed;with call bell/phone within reach;with bed alarm set (sitting EOB )   Nurse Communication Mobility status;Patient requests pain meds        Time: 2924-4628 OT Time Calculation (min): 24 min  Charges: OT General Charges $OT Visit: 1 Visit OT Treatments $Therapeutic Activity: 8-22 mins  [facility], OTR/L [phone]    Lucille Passy M 07/02/2017, 4:42 PM

## 2017-07-02 NOTE — Consult Note (Signed)
            [facility] CM Primary Care Navigator  07/02/2017  Nicolas Butler [birth year] [id number]    Went to see patient at the bedside to identify possible discharge needs. Patientreports that he returned for follow-up and consideration of repair of abdominal aortic aneurysm that had led to this admission/ surgery.                    Patientendorses Aggie Hacker, [facility].   [facility]  [place] [facility].   Patient states managinghis own medications at home straight out of the containers ("taking only a few").  Patient reports he was driving prior to admission/ surgery. Daughter Levada Dy - [facility] RN) will provide transportation to hisdoctors'appointments after discharge.  Patient verbalized living alone. His son Reali, Brooke Bonito) will be able to assist with his care needs after discharge as stated.   Patient reports that plan is [facility]dis[facility] as recommended by therapy, prior to returning home.  Patientvoiced understanding to call primary care provider'S officewhen hereturns backhome,for a post discharge follow-up within a week or sooner if needs arise.Patient letter (with PCP's contact number) was provided as areminder.   Discussed with patient regarding [facility] CM services available for health management. Patient states that he has no problem managing at home with medications, diet, exercise and close follow-up with provider when needed.  He expressed understanding to seekreferral  from primary care provider to [facility] care management ifdeemed necessary and appropriatefor services in the future.   [facility] care management information was provided for future needs that he may have.   For additional questions please contact:  Edwena Felty A. Shakena Callari, BSN, RN-BC [facility] PRIMARY CARE Navigator Cell: [phone]

## 2017-07-02 NOTE — Care Management Important Message (Signed)
Important Message  Patient Details  Name: Nicolas Butler MRN: [id number] Date of Birth: 06-04-1951   Medicare Important Message Given:  Yes    [name] Abena 07/02/2017, 10:03 AM

## 2017-07-02 NOTE — Progress Notes (Addendum)
[facility] [place]  Subjective  - Very sleepy, he just received IV pain medication.   Objective 132/88 73 98.8 F (37.1 C) (Oral) (!) 22 93%  Intake/Output Summary (Last 24 hours) at 07/02/17 0739 Last data filed at 07/02/17 0600  Gross per 24 hour  Intake          1366.25 ml  Output             2950 ml  Net         -1583.75 ml    Palpable DP2+ B Abdomen soft, passing Flatus  Heart RRR Lungs non labored breathing  Assessment/Planning: Open Repair of infrarenalabdominal aortic aneurysm  4 Days Post-Op  Will D/C NG tube and slowly start clears/ice chips and PO medications. Good UO, Cr decreasing    Theda Sers, EMMA [facility] 07/02/2017 7:39 AM    Addendum  I have independently interviewed and examined the patient, and I agree with the physician assistant's findings.  Pt still with some intolerance to soft diet so hold on advancing diet.  Adele Barthel, MD, FACS [facility] [place] Office: [phone] Pager: [phone]  07/02/2017, 5:01 PM   --  Laboratory Lab Results:  Recent Labs  07/01/17 0306 07/02/17 0329  WBC 12.8* 12.2*  HGB 11.8* 11.6*  HCT 35.1* 33.4*  PLT 150 158   BMET  Recent Labs  07/01/17 0306 07/02/17 0329  NA 132* 132*  K 4.7 4.1  CL 101 101  CO2 24 25  GLUCOSE 120* 111*  BUN 14 13  CREATININE 1.46* 1.31*  CALCIUM 8.1* 8.3*    COAG Lab Results  Component Value Date   INR 1.22 06/28/2017   INR 1.03 06/24/2017   No results found for: PTT

## 2017-07-02 NOTE — Progress Notes (Signed)
Pt assisted to ambulate approx 350 ft in hall using RW.  Tendency to veer left.  No complaints, no rests.  To side side of bed after walk with call bell and yankeur in reach, very appreciative.  Will con't plan of care.

## 2017-07-02 NOTE — Progress Notes (Signed)
Physical Therapy Treatment Patient Details Name: Nicolas Butler MRN: [id number] DOB: 1951/07/12 Today's Date: 07/02/2017    History of Present Illness Pt is a 66 y.o. male s/p open abdominal aortic aneurysm repair. PMH significant for AAA and unspecified essential hypertension. Past surgical history includes: hand surgery, incisional hernia repair, and umbilical hernia repair.     PT Comments    Patient is making progress toward mobility goals and was able to ambulate 372ft with RW and min guard/min A. Pt required directional cues to navigate hallway and running into objects especially on the R side with RW. VSS throughout session. Continue to progress as tolerated.   Follow Up Recommendations  Supervision/Assistance - 24 hour;CIR (If not 24 hour care initially will need Rehab)     Equipment Recommendations  Rolling walker with 5" wheels;3in1 (PT)    Recommendations for Other Services       Precautions / Restrictions Precautions Precautions: Fall Restrictions Weight Bearing Restrictions: Yes (abdominal precautions)    Mobility  Bed Mobility               General bed mobility comments: Pt OOB in chair upon arrival  Transfers Overall transfer level: Needs assistance Equipment used: Rolling walker (2 wheeled) Transfers: Sit to/from Stand Sit to Stand: Min guard         General transfer comment: min guard for safety; cues for safe hand placement  Ambulation/Gait Ambulation/Gait assistance: Min assist;Min guard Ambulation Distance (Feet): 300 Feet Assistive device: Rolling walker (2 wheeled) (Eva walker) Gait Pattern/deviations: Step-through pattern;Decreased stride length;Decreased step length - right;Decreased step length - left;Trunk flexed Gait velocity: decreased   General Gait Details: cues for posture and increased bilat step lengths; pt with grossly steady gait with cues needed for navigating hallway as pt tends to run into objects with RW   Stairs             Wheelchair Mobility    Modified Rankin (Stroke Patients Only)       Balance Overall balance assessment: Needs assistance Sitting-balance support: No upper extremity supported;Feet supported Sitting balance-Leahy Scale: Fair     Standing balance support: Bilateral upper extremity supported;During functional activity Standing balance-Leahy Scale: Poor                              Cognition Arousal/Alertness: Awake/alert Behavior During Therapy: WFL for tasks assessed/performed Overall Cognitive Status: Within Functional Limits for tasks assessed                                        Exercises      General Comments General comments (skin integrity, edema, etc.): VSS      Pertinent Vitals/Pain Pain Assessment: 0-10 Pain Score: 6  Pain Location: incision Pain Descriptors / Indicators: Guarding;Grimacing;Sore Pain Intervention(s): Limited activity within patient's tolerance;Monitored during session;Premedicated before session    Home Living                      Prior Function            PT Goals (current goals can now be found in the care plan section) Acute Rehab PT Goals Patient Stated Goal: to go home PT Goal Formulation: With patient Time For Goal Achievement: 07/14/17 Potential to Achieve Goals: Good Progress towards PT goals: Progressing toward goals    Frequency  Min 3X/week      PT Plan Current plan remains appropriate    Co-evaluation              AM-PAC PT "6 Clicks" Daily Activity  Outcome Measure  Difficulty turning over in bed (including adjusting bedclothes, sheets and blankets)?: A Little Difficulty moving from lying on back to sitting on the side of the bed? : A Lot Difficulty sitting down on and standing up from a chair with arms (e.g., wheelchair, bedside commode, etc,.)?: A Lot Help needed moving to and from a bed to chair (including a wheelchair)?: A Little Help needed  walking in hospital room?: A Little Help needed climbing 3-5 steps with a railing? : A Lot 6 Click Score: 15    End of Session Equipment Utilized During Treatment: Gait belt Activity Tolerance: Patient tolerated treatment well Patient left: in chair;with call bell/phone within reach Nurse Communication: Mobility status PT Visit Diagnosis: Unsteadiness on feet (R26.81);Muscle weakness (generalized) (M62.81);Pain Pain - part of body:  (incision)     Time: 3299-2426 PT Time Calculation (min) (ACUTE ONLY): 26 min  Charges:  $Gait Training: 8-22 mins $Therapeutic Activity: 8-22 mins                    G Codes:       Earney Navy, PTA Pager: [phone]     Darliss Cheney 07/02/2017, 2:08 PM

## 2017-07-02 NOTE — Telephone Encounter (Signed)
Sched appt 07/22/17 at 3:15. Lm on hm#.

## 2017-07-03 LAB — BASIC METABOLIC PANEL
ANION GAP: 5 (ref 5–15)
BUN: 20 mg/dL (ref 6–20)
CO2: 25 mmol/L (ref 22–32)
Calcium: 8.2 mg/dL — ABNORMAL LOW (ref 8.9–10.3)
Chloride: 103 mmol/L (ref 101–111)
Creatinine, Ser: 1.42 mg/dL — ABNORMAL HIGH (ref 0.61–1.24)
GFR calc Af Amer: 58 mL/min — ABNORMAL LOW (ref >60)
GFR calc non Af Amer: 50 mL/min — ABNORMAL LOW (ref >60)
GLUCOSE: 108 mg/dL — AB (ref 65–99)
POTASSIUM: 4.4 mmol/L (ref 3.5–5.1)
Sodium: 133 mmol/L — ABNORMAL LOW (ref 135–145)

## 2017-07-03 LAB — CBC
HCT: 31.8 % — ABNORMAL LOW (ref 39.0–52.0)
Hemoglobin: 10.9 g/dL — ABNORMAL LOW (ref 13.0–17.0)
MCH: 32.6 pg (ref 26.0–34.0)
MCHC: 34.3 g/dL (ref 30.0–36.0)
MCV: 95.2 fL (ref 78.0–100.0)
Platelets: 176 10*3/uL (ref 150–400)
RBC: 3.34 MIL/uL — ABNORMAL LOW (ref 4.22–5.81)
RDW: 12.7 % (ref 11.5–15.5)
WBC: 9.8 10*3/uL (ref 4.0–10.5)

## 2017-07-03 NOTE — Progress Notes (Addendum)
[facility] [place]  Subjective  - Doing well over all.  Tolerating clears without problems, ambulating more each day.   Objective (!) 149/76 62 97.6 F (36.4 C) (Oral) (!) 21 95%  Intake/Output Summary (Last 24 hours) at 07/03/17 0747 Last data filed at 07/03/17 0500  Gross per 24 hour  Intake             2445 ml  Output              800 ml  Net             1645 ml    Palpable DP B Abdomen compressible, positive BS Abdominal incision healing well  Assessment/Planning: Open Repair of infrarenalabdominal aortic aneurysm  5 Days Post-Op  Will advance diet at lunch time to heart healthy. Continue mobility    Laurence Slate [facility] 07/03/2017 7:47 AM   Addendum  I have independently interviewed and examined the patient, and I agree with the physician assistant's findings.  Agree pt probably ready for d/c from inpt next 1-2 days.  Appears PT is recommending CIR.  - will see if CIR candidate  Adele Barthel, MD, FACS [facility] [place] Office: [phone] Pager: [phone]  07/03/2017, 9:15 AM   --  Laboratory Lab Results:  Recent Labs  07/02/17 0329 07/03/17 0310  WBC 12.2* 9.8  HGB 11.6* 10.9*  HCT 33.4* 31.8*  PLT 158 176   BMET  Recent Labs  07/02/17 0329 07/03/17 0310  NA 132* 133*  K 4.1 4.4  CL 101 103  CO2 25 25  GLUCOSE 111* 108*  BUN 13 20  CREATININE 1.31* 1.42*  CALCIUM 8.3* 8.2*    COAG Lab Results  Component Value Date   INR 1.22 06/28/2017   INR 1.03 06/24/2017   No results found for: PTT

## 2017-07-04 LAB — URINALYSIS, ROUTINE W REFLEX MICROSCOPIC
Bilirubin Urine: NEGATIVE
Glucose, UA: NEGATIVE mg/dL
Ketones, ur: NEGATIVE mg/dL
Leukocytes, UA: NEGATIVE
Nitrite: NEGATIVE
PROTEIN: NEGATIVE mg/dL
SPECIFIC GRAVITY, URINE: 1.006 (ref 1.005–1.030)
Squamous Epithelial / HPF: NONE SEEN
pH: 5 (ref 5.0–8.0)

## 2017-07-04 NOTE — Progress Notes (Addendum)
[facility] [place]  Subjective  - Making progress, tolerated PO diet.   Objective (!) 149/87 72 97.7 F (36.5 C) (Oral) 13 95%  Intake/Output Summary (Last 24 hours) at 07/04/17 0755 Last data filed at 07/04/17 0400  Gross per 24 hour  Intake              720 ml  Output             3700 ml  Net            -2980 ml    Palpable DP B Abdomin soft, incision healing well Heart RRR Lungs non labored breathing  Assessment/Planning: Open Repair of infrarenalabdominal aortic aneurysm  5 Days Post-Op  Pending CIR verse SNF he lives alone He tolerated carb modified diet yesterday without N/V Cr stable 1.42 back to base line UO good  COLLINS, EMMA [facility] 07/04/2017 7:55 AM   Addendum  I have independently interviewed and examined the patient, and I agree with the physician assistant's findings.  Awaiting CIR evaluation.  If not an option, SNF placement for rehab   Adele Barthel, MD, FACS [facility] [place]: [phone] Pager: [phone]  07/04/2017, 9:14 AM   --  Laboratory Lab Results:  Recent Labs  07/02/17 0329 07/03/17 0310  WBC 12.2* 9.8  HGB 11.6* 10.9*  HCT 33.4* 31.8*  PLT 158 176   BMET  Recent Labs  07/02/17 0329 07/03/17 0310  NA 132* 133*  K 4.1 4.4  CL 101 103  CO2 25 25  GLUCOSE 111* 108*  BUN 13 20  CREATININE 1.31* 1.42*  CALCIUM 8.3* 8.2*    COAG Lab Results  Component Value Date   INR 1.22 06/28/2017   INR 1.03 06/24/2017   No results found for: PTT

## 2017-07-05 DIAGNOSIS — E871 Hypo-osmolality and hyponatremia: Secondary | ICD-10-CM

## 2017-07-05 DIAGNOSIS — I714 Abdominal aortic aneurysm, without rupture: Principal | ICD-10-CM

## 2017-07-05 DIAGNOSIS — G8918 Other acute postprocedural pain: Secondary | ICD-10-CM

## 2017-07-05 DIAGNOSIS — N183 Chronic kidney disease, stage 3 unspecified: Secondary | ICD-10-CM

## 2017-07-05 DIAGNOSIS — E785 Hyperlipidemia, unspecified: Secondary | ICD-10-CM

## 2017-07-05 DIAGNOSIS — D62 Acute posthemorrhagic anemia: Secondary | ICD-10-CM

## 2017-07-05 DIAGNOSIS — I10 Essential (primary) hypertension: Secondary | ICD-10-CM

## 2017-07-05 NOTE — Consult Note (Signed)
Physical Medicine and Rehabilitation Consult Reason for Consult: Decreased functional mobility Referring Physician: Dr. Oneida Alar   HPI: Nicolas Butler is a 66 y.o. right handed male with history of CKD stage III, hyperlipidemia, hypertension, remote tobacco abuse and abdominal aortic aneurysm originally discovered 2017 with a 4.5 cm diameter. Per chart review and patient, patient lives alone independently prior to admission. One level home with 7 steps to entry. Daughter is an Therapist, sports and works split shifts. Presented 06/28/2017 for follow-up repair of abdominal aortic aneurysm. Follow-up CT scan showed abdominal aortic aneurysm now 5.4 cm. Right common iliac artery 2.3 cm in diameter. 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 Underwent repair of infrarenal abdominal aortic aneurysm 06/28/2017 per Dr. Oneida Alar. Hospital course pain management. Acute blood loss anemia 10.9 and monitored. Tolerating a regular diet. Physical therapy evaluation completed with recommendations of physical medicine rehabilitation consult.   Review of Systems  Constitutional: Negative for chills.  HENT: Negative for hearing loss.   Eyes: Negative for blurred vision and double vision.  Respiratory: Negative for shortness of breath.   Cardiovascular: Negative for chest pain, palpitations and leg swelling.  Gastrointestinal: Positive for abdominal pain, constipation and nausea.  Genitourinary: Negative for dysuria, flank pain and hematuria.  Musculoskeletal: Positive for myalgias.  Neurological: Positive for weakness. Negative for seizures.  All other systems reviewed and are negative.  Past Medical History:  Diagnosis Date  . AAA (abdominal aortic aneurysm) ([place])   . Chronic kidney disease (CKD), stage III (moderate) ([facility])   . High cholesterol   . Unspecified essential hypertension    Past Surgical History:  Procedure Laterality Date  . ABDOMINAL AORTIC ANEURYSM REPAIR N/A 06/28/2017   Procedure: ANEURYSM  ABDOMINAL AORTIC REPAIR OPEN;  Surgeon: Elam Dutch, MD;  Location: [facility] OR;  Service: Vascular;  Laterality: N/A;  . HAND SURGERY Left   . INCISIONAL HERNIA REPAIR     LEFT  . IRRIGATION AND DEBRIDEMENT SEBACEOUS CYST     FROM RIGHT SHOULDER  . UMBILICAL HERNIA REPAIR     Family History  Problem Relation Age of Onset  . Pneumonia Mother   . Other Father        brain tumor  . Diabetes Son    Social History:  reports that he quit smoking about 4 years ago. His smoking use included Cigarettes. He started smoking about 52 years ago. He quit after 46.00 years of use. He has never used smokeless tobacco. He reports that he does not drink alcohol or use drugs. Allergies:  Allergies  Allergen Reactions  . Hydrochlorothiazide Other (See Comments)    Pt states "it made my legs hurt"  . Atorvastatin Other (See Comments)    Muscle pain  . Pravastatin Other (See Comments)    Muscle pain   Medications Prior to Admission  Medication Sig Dispense Refill  . amLODipine (NORVASC) 10 MG tablet Take 10 mg by mouth daily.  3  . aspirin EC 81 MG tablet Take 1 tablet (81 mg total) by mouth daily.    Marland Kitchen atorvastatin (LIPITOR) 80 MG tablet Take 80 mg by mouth daily. Takes 2 times a week    . carvedilol (COREG) 3.125 MG tablet Take 1 tablet (3.125 mg total) by mouth 2 (two) times daily. 60 tablet 6  . Cholecalciferol (VITAMIN D3) 5000 units CAPS Take 10,000 Units by mouth daily.    Marland Kitchen lisinopril (PRINIVIL,ZESTRIL) 40 MG tablet Take 1 tablet (40 mg total) by mouth daily.  30 tablet 6  . Multiple Vitamin (MULTIVITAMIN WITH MINERALS) TABS tablet Take 1 tablet by mouth daily.    Marland Kitchen NITROSTAT 0.4 MG SL tablet Place 1 tablet under the tongue every 5 (five) minutes x 3 doses as needed for chest pain.       Home: Home Living Family/patient expects to be discharged to:: Private residence Living Arrangements: Alone Available Help at Discharge: Family, Available PRN/intermittently (daughter to stay with pt  initially; unsure if 24/7) Type of Home: House Home Access: Stairs to enter [employer] of Steps: 7 Bathroom Shower/Tub: Insurance claims handler: None Additional Comments: Need to further clarify home set-up.   Functional History: Prior Function Level of Independence: Independent Comments: Very independent PTA Functional Status:  Mobility: Bed Mobility Overal bed mobility: Needs Assistance Bed Mobility: Rolling, Sidelying to Sit Rolling: Min guard Sidelying to sit: Mod assist Sit to sidelying: Mod assist General bed mobility comments: Pt sitting on EOB on arrival  Transfers Overall transfer level: Needs assistance Equipment used: Rolling walker (2 wheeled) Transfers: Sit to/from Stand, Stand Pivot Transfers Sit to Stand: Min guard Stand pivot transfers: Min guard General transfer comment: min guard for safety.  Cues for hand placement  Ambulation/Gait Ambulation/Gait assistance: Min assist, Min guard Ambulation Distance (Feet): 300 Feet Assistive device: Rolling walker (2 wheeled) (Eva walker) Gait Pattern/deviations: Step-through pattern, Decreased stride length, Decreased step length - right, Decreased step length - left, Trunk flexed General Gait Details: cues for posture and increased bilat step lengths; pt with grossly steady gait with cues needed for navigating hallway as pt tends to run into objects with RW Gait velocity: decreased Gait velocity interpretation: Below normal speed for age/gender    ADL: ADL Overall ADL's : Needs assistance/impaired Eating/Feeding: Set up, Sitting Grooming: Wash/dry hands, Wash/dry face, Min guard, Standing Upper Body Bathing: Sitting, Minimal assistance Lower Body Bathing: Minimal assistance, Sit to/from stand Upper Body Dressing : Minimal assistance, Sitting Lower Body Dressing: Sit to/from stand, Min guard Lower Body Dressing Details (indicate cue type and reason): Pt able to cross ankles over knees to don/doff  socks  Toilet Transfer: Min guard, Ambulation, Comfort height toilet, Grab bars, RW Toileting- Clothing Manipulation and Hygiene: Min guard, Sit to/from stand Functional mobility during ADLs: Min guard, Rolling walker General ADL Comments: Pt able to ambulate throughout unit on RA with desaturation to 85% momentarily with quick rebound to upper 90's with pursed lip breathing techniques.    Cognition: Cognition Overall Cognitive Status: Within Functional Limits for tasks assessed Orientation Level: Oriented X4 Cognition Arousal/Alertness: Awake/alert Behavior During Therapy: WFL for tasks assessed/performed Overall Cognitive Status: Within Functional Limits for tasks assessed  Blood pressure (!) 142/88, pulse 65, temperature 98.2 F (36.8 C), temperature source Oral, resp. rate (!) 21, height 5\' 9"  (1.753 m), weight 89.4 kg (197 lb 1.5 oz), SpO2 97 %. Physical Exam  Vitals reviewed. Constitutional: He is oriented to person, place, and time. He appears well-developed and well-nourished.  HENT:  Head: Normocephalic and atraumatic.  Eyes: EOM are normal. Right eye exhibits no discharge. Left eye exhibits no discharge.  Neck: Normal range of motion. Neck supple. No thyromegaly present.  Cardiovascular: Normal rate, regular rhythm and normal heart sounds.   Respiratory: Effort normal and breath sounds normal. No respiratory distress.  GI:  Abdomen is guarded. Positive bowel sounds  Musculoskeletal: He exhibits edema. He exhibits no tenderness.  Neurological: He is alert and oriented to person, place, and time.  Motor: B/l UE 5/5 proximal to distal  B/l LE: HF 4/5, KE 4+/5, ADF/PF 5/5  Skin: Skin is warm and dry.  +Incision  Psychiatric: He has a normal mood and affect. His behavior is normal.    Results for orders placed or performed during the hospital encounter of 06/28/17 (from the past 24 hour(s))  Urinalysis, Routine w reflex microscopic     Status: Abnormal   Collection Time:  07/04/17  9:30 AM  Result Value Ref Range   Color, Urine YELLOW YELLOW   APPearance CLEAR CLEAR   Specific Gravity, Urine 1.006 1.005 - 1.030   pH 5.0 5.0 - 8.0   Glucose, UA NEGATIVE NEGATIVE mg/dL   Hgb urine dipstick LARGE (A) NEGATIVE   Bilirubin Urine NEGATIVE NEGATIVE   Ketones, ur NEGATIVE NEGATIVE mg/dL   Protein, ur NEGATIVE NEGATIVE mg/dL   Nitrite NEGATIVE NEGATIVE   Leukocytes, UA NEGATIVE NEGATIVE   RBC / HPF 0-5 0 - 5 RBC/hpf   WBC, UA 0-5 0 - 5 WBC/hpf   Bacteria, UA RARE (A) NONE SEEN   Squamous Epithelial / LPF NONE SEEN NONE SEEN   No results found.  Assessment/Plan: Diagnosis: Debility Labs independently reviewed.  Records reviewed and summated above.  1. Does the need for close, 24 hr/day medical supervision in concert with the patient's rehab needs make it unreasonable for this patient to be served in a less intensive setting? Potentially  2. Co-Morbidities requiring supervision/potential complications: pain management. Acute blood loss anemia (transfuse if necessary to ensure appropriate perfusion for increased activity tolerance), CKD stage III (avoid nephrotoxic meds), hyperlipidemia (cont meds), HTN (monitor and provide prns in accordance with increased physical exertion and pain), remote tobacco abuse, Hyponatremia (cont to monitor, treat if necessary), post-op pain (Biofeedback training with therapies to help reduce reliance on opiate pain medications, monitor pain control during therapies, and sedation at rest and titrate to maximum efficacy to ensure participation and gains in therapies) 3. Due to safety, skin/wound care, disease management, pain management and patient education, does the patient require 24 hr/day rehab nursing? Potentially 4. Does the patient require coordinated care of a physician, rehab nurse, PT (1-2 hrs/day, 5 days/week) and OT (1-2 hrs/day, 5 days/week) to address physical and functional deficits in the context of the above medical  diagnosis(es)? Potentially Addressing deficits in the following areas: balance, endurance, locomotion, transferring, bathing, dressing, toileting and psychosocial support 5. Can the patient actively participate in an intensive therapy program of at least 3 hrs of therapy per day at least 5 days per week? Yes 6. The potential for patient to make measurable gains while on inpatient rehab is good 7. Anticipated functional outcomes upon discharge from inpatient rehab are modified independent and supervision  with PT, modified independent and supervision with OT, n/a with SLP. 8. Estimated rehab length of stay to reach the above functional goals is: 4-7 days. 9. Anticipated D/C setting: Home 10. Anticipated post D/C treatments: HH therapy and Home excercise program 11. Overall Rehab/Functional Prognosis: excellent  RECOMMENDATIONS: This patient's condition is appropriate for continued rehabilitative care in the following setting: Pt with daily gains. Does not require CIR at present. Recommend home with [facility], otherwise SNF. Patient has agreed to participate in recommended program. Potentially Note that insurance prior authorization may be required for reimbursement for recommended care.  Comment: Rehab Admissions Coordinator to follow up.  Delice Lesch, MD, ABPMR Lauraine Rinne J., PA-C 07/05/2017

## 2017-07-05 NOTE — Progress Notes (Signed)
Rehab admissions - Please see rehab consult done by Dr. Posey Pronto today.  Patient is doing well and does not need inpatient rehab admission.  Will benefit from [facility] or SNF if family cannot provide supervision at home.  Call me for questions.  #[phone]

## 2017-07-05 NOTE — Plan of Care (Signed)
Problem: Safety: Goal: Ability to remain free from injury will improve Outcome: Progressing Pt bed in low position. Call bell within reach

## 2017-07-05 NOTE — Progress Notes (Signed)
Physical Therapy Treatment Patient Details Name: Nicolas Butler MRN: [id number] DOB: [birth year] Today's Date: 07/05/2017    History of Present Illness Pt is a 66 y.o. male s/p open abdominal aortic aneurysm repair. PMH significant for AAA and unspecified essential hypertension. Past surgical history includes: hand surgery, incisional hernia repair, and umbilical hernia repair.     PT Comments    Patient continues to make progress toward mobility goals. Pt tolerated increased gait distance this session. Pt does demonstrate decreased awareness of safety and began ambulating without AD. Pt requires AD for safe ambulation. Pt will continue to benefit from further skilled PT services to maximize safety and independence with functional mobility.    Follow Up Recommendations  Supervision/Assistance - 24 hour;CIR (If not 24 hour care initially will need Rehab)     Equipment Recommendations  Rolling walker with 5" wheels;3in1 (PT)    Recommendations for Other Services       Precautions / Restrictions Precautions Precautions: Fall Restrictions Weight Bearing Restrictions: No    Mobility  Bed Mobility               General bed mobility comments: pt OOB in chair upon arrival  Transfers Overall transfer level: Needs assistance Equipment used: Rolling walker (2 wheeled) Transfers: Sit to/from Stand Sit to Stand: Supervision         General transfer comment: supervision for safety;pt tends to be impulsive and with decreased safety awareness  Ambulation/Gait Ambulation/Gait assistance: Min assist;Min guard Ambulation Distance (Feet): 400 Feet Assistive device: Rolling walker (2 wheeled) Gait Pattern/deviations: Step-through pattern;Decreased stride length;Decreased step length - right;Decreased step length - left;Trunk flexed Gait velocity: decreased   General Gait Details: cues for safety as pt began ambulating without AD to bathroom from recliner; pt required min A for  balance as pt was staggering without AD; pt given RW for stability; pt required min guard for safety with use of AD; pt needed cues for navigating around objects in room    Stairs            Wheelchair Mobility    Modified Rankin (Stroke Patients Only)       Balance Overall balance assessment: Needs assistance Sitting-balance support: No upper extremity supported;Feet supported Sitting balance-Leahy Scale: Good     Standing balance support: During functional activity;No upper extremity supported Standing balance-Leahy Scale: Fair                              Cognition Arousal/Alertness: Awake/alert Behavior During Therapy: WFL for tasks assessed/performed Overall Cognitive Status: Within Functional Limits for tasks assessed Area of Impairment: Safety/judgement                         Safety/Judgement: Decreased awareness of safety            Exercises      General Comments General comments (skin integrity, edema, etc.): pt reported increased throat soreness today; RN notified      Pertinent Vitals/Pain Pain Assessment: Faces Faces Pain Scale: Hurts even more Pain Location: abdomen when coughing Pain Descriptors / Indicators: Guarding;Grimacing;Sore Pain Intervention(s): Monitored during session;Repositioned    Home Living                      Prior Function            PT Goals (current goals can now be found in the care plan  section) Acute Rehab PT Goals Patient Stated Goal: to go home PT Goal Formulation: With patient Time For Goal Achievement: 07/14/17 Potential to Achieve Goals: Good Progress towards PT goals: Progressing toward goals    Frequency    Min 3X/week      PT Plan Current plan remains appropriate    Co-evaluation              AM-PAC PT "6 Clicks" Daily Activity  Outcome Measure  Difficulty turning over in bed (including adjusting bedclothes, sheets and blankets)?: A Little Difficulty  moving from lying on back to sitting on the side of the bed? : A Lot Difficulty sitting down on and standing up from a chair with arms (e.g., wheelchair, bedside commode, etc,.)?: A Lot Help needed moving to and from a bed to chair (including a wheelchair)?: A Little Help needed walking in hospital room?: A Little Help needed climbing 3-5 steps with a railing? : A Lot 6 Click Score: 15    End of Session Equipment Utilized During Treatment: Gait belt Activity Tolerance: Patient tolerated treatment well Patient left: in chair;with call bell/phone within reach Nurse Communication: Mobility status PT Visit Diagnosis: Unsteadiness on feet (R26.81);Muscle weakness (generalized) (M62.81);Pain Pain - part of body:  (incision)     Time: 9562-1308 PT Time Calculation (min) (ACUTE ONLY): 18 min  Charges:  $Gait Training: 8-22 mins                    G Codes:       Earney Navy, PTA Pager: [phone]     Darliss Cheney 07/05/2017, 11:42 AM

## 2017-07-05 NOTE — Progress Notes (Addendum)
[facility] [place]  Subjective  - Doing better each day.   Objective (!) 142/88 65 98.2 F (36.8 C) (Oral) (!) 21 97%  Intake/Output Summary (Last 24 hours) at 07/05/17 0727 Last data filed at 07/05/17 0100  Gross per 24 hour  Intake              720 ml  Output             2150 ml  Net            -1430 ml    Palpable DP B Abdomin soft, incision healing well Heart RRR Lungs non labored breathing  Assessment/Planning: Open Repair of infrarenalabdominal aortic aneurysm  6Days Post-Op Good UO Mobility improving Pending CIR   Nicolas Butler Nicolas Butler 07/05/2017 7:27 AM   Addendum  I have independently interviewed and examined the patient, and I agree with the physician assistant's findings.  CIR declines pt.  Proceed with SNF placement.  Adele Barthel, MD, FACS [facility] Office: [phone] Pager: [phone]  07/05/2017, 3:58 PM   --  Laboratory Lab Results:  Recent Labs  07/03/17 0310  WBC 9.8  HGB 10.9*  HCT 31.8*  PLT 176   BMET  Recent Labs  07/03/17 0310  NA 133*  K 4.4  CL 103  CO2 25  GLUCOSE 108*  BUN 20  CREATININE 1.42*  CALCIUM 8.2*    COAG Lab Results  Component Value Date   INR 1.22 06/28/2017   INR 1.03 06/24/2017   No results found for: PTT

## 2017-07-06 MED ORDER — OXYCODONE-ACETAMINOPHEN 5-325 MG PO TABS: 1.0000 | ORAL_TABLET | ORAL | 0 refills | Status: DC | PRN

## 2017-07-06 NOTE — Progress Notes (Signed)
Order received to discharge patient.  PIV access removed.  Telemetry monitor removed and CCMD notified.  Discharge instructions, follow up, medications and instructions for their use discussed with patient and daughter.

## 2017-07-06 NOTE — Progress Notes (Addendum)
[facility]  Subjective  - He is doing well.   Objective (!) 158/93 74 98.7 F (37.1 C) (Oral) (!) 31 90%  Intake/Output Summary (Last 24 hours) at 07/06/17 0736 Last data filed at 07/06/17 0459  Gross per 24 hour  Intake                0 ml  Output              600 ml  Net             -600 ml    Palpable DP B Abdomin soft, incision healing well Heart RRR Lungs non labored breathing  Assessment/Planning: Open Repair of infrarenalabdominal aortic aneurysm  7Days Post-Op Good UO Mobility improving  Discharge home with daughter, no equipment needs and no HH  COLLINS, EMMA MAUREEN 07/06/2017 7:36 AM -- Abdomen soft incision healing 2+ DP pulses trace pedal edema D/c home today  [name], MD [facility] [place] Office: [phone] Pager: [phone]  Laboratory Lab Results: No results for input(s): WBC, HGB, HCT, PLT in the last 72 hours. BMET No results for input(s): NA, K, CL, CO2, GLUCOSE, BUN, CREATININE, CALCIUM in the last 72 hours.  COAG Lab Results  Component Value Date   INR 1.22 06/28/2017   INR 1.03 06/24/2017   No results found for: PTT

## 2017-07-06 NOTE — Discharge Summary (Signed)
[facility] Discharge Summary   Patient ID:  Nicolas Butler MRN: [id number] DOB/AGE: 13-Jul-1951 66 y.o.  Admit date: 06/28/2017 Discharge date: 07/06/2017 Date of Surgery: 06/28/2017 Surgeon: Surgeon(s): Fields, Jessy Oto, MD Waynetta Sandy, MD  Admission Diagnosis: Abdominal Aortic Aneurysm   I71.4  Discharge Diagnoses:  Abdominal Aortic Aneurysm   I71.4  Secondary Diagnoses: Past Medical History:  Diagnosis Date  . AAA (abdominal aortic aneurysm) ([place])   . Chronic kidney disease (CKD), stage III (moderate) (HCC)   . High cholesterol   . Unspecified essential hypertension     Procedure(s): ANEURYSM ABDOMINAL AORTIC REPAIR OPEN  Discharged Condition: good  HPI: Nicolas Butler is a 66 y.o. male who returns today for follow-up and consideration of repair of abdominal aortic aneurysm. He was deemed not to be an infrarenal stent graft candidate due to the large diameter of the aortic neck. He has recently had a cardiac stress test which was negative. He denies any abdominal or back pain currently. He has had one previous abdominal operation for an umbilical hernia repair. He denies any claudication symptoms. His aneurysm was originally discovered in 2017 and was 4.8 cm in diameter at that time. He has no family history of abdominal aortic aneurysm. He is on aspirin and a statin. He does have shortness of breath with vigorous activity. He is a former smoker.   DATA:  I reviewed the patient's CT scan of the abdomen and pelvis from June 2018. This shows a 5.4 cm infrarenal abdominal aortic aneurysm. The right common iliac artery is 2.3 cm in diameter. 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  ASSESSMENT:  Abdominal aortic aneurysm 5.4 cm in diameter. Not a candidate for stent graft repair and will need open abdominal aortic aneurysm repair. Potentially would address the common iliac artery at the same time depending on how this looks in the  operating room. I would not plan on addressing the right renal artery aneurysm as this is quite small.  Hospital Course:  Nicolas Butler is a 66 y.o. male is S/P Procedure(s): ANEURYSM ABDOMINAL AORTIC REPAIR OPEN Left side of abdomen with raised erythematous areas about a dozen with 3 cm circumference No itching No prior reaction to betadine or iodine Most likely skin reaction to [place] drape. Currently asymptomatic Will observe for now  POD# 1  A line has been D/C, 1 liter NS ordered Cr slightly elevated 1.78 UO 50-60 per hour Emesis NG out put 830 last 24 hours No flatus yet will maintain NG tube. Possible transfer to 4E later today  2 Days Post-Op  -pt doing well this am-has palpable DP pulses bilaterally -encouraged pt to IS every hour instead of twice a day; he has ambulated in the halls -he has minimal BS and no flatus-will give dulcolax this am.  Continue npo an ambulation -will draw labs in the am to check renal function -foley out yesterday-pt with 1735cc/24hr UOP -continue NGT for now  Open Repair of infrarenalabdominal aortic aneurysm  6Days Post-Op Good UO Mobility improving Plan to discharge home with daughter Cr to baseline CKD, encourage H2O intake    Significant Diagnostic Studies: CBC Lab Results  Component Value Date   WBC 9.8 07/03/2017   HGB 10.9 (L) 07/03/2017   HCT 31.8 (L) 07/03/2017   MCV 95.2 07/03/2017   PLT 176 07/03/2017    BMET    Component Value Date/Time   NA 133 (L) 07/03/2017 0310   K 4.4 07/03/2017  0310   CL 103 07/03/2017 0310   CO2 25 07/03/2017 0310   GLUCOSE 108 (H) 07/03/2017 0310   BUN 20 07/03/2017 0310   CREATININE 1.42 (H) 07/03/2017 0310   CALCIUM 8.2 (L) 07/03/2017 0310   GFRNONAA 50 (L) 07/03/2017 0310   GFRAA 58 (L) 07/03/2017 0310   COAG Lab Results  Component Value Date   INR 1.22 06/28/2017   INR 1.03 06/24/2017     Disposition:  Discharge to :home Discharge Instructions    Call MD for:   redness, tenderness, or signs of infection (pain, swelling, bleeding, redness, odor or green/yellow discharge around incision site)    Complete by:  As directed    Call MD for:  severe or increased pain, loss or decreased feeling  in affected limb(s)    Complete by:  As directed    Call MD for:  temperature >100.5    Complete by:  As directed    Discharge instructions    Complete by:  As directed    He may shower daily PRN   Resume previous diet    Complete by:  As directed      Allergies as of 07/06/2017      Reactions   Hydrochlorothiazide Other (See Comments)   Pt states "it made my legs hurt"   Atorvastatin Other (See Comments)   Muscle pain   Pravastatin Other (See Comments)   Muscle pain      Medication List    TAKE these medications   amLODipine 10 MG tablet Commonly known as:  NORVASC Take 10 mg by mouth daily.   aspirin EC 81 MG tablet Take 1 tablet (81 mg total) by mouth daily.   atorvastatin 80 MG tablet Commonly known as:  LIPITOR Take 80 mg by mouth daily. Takes 2 times a week   carvedilol 3.125 MG tablet Commonly known as:  COREG Take 1 tablet (3.125 mg total) by mouth 2 (two) times daily.   lisinopril 40 MG tablet Commonly known as:  PRINIVIL,ZESTRIL Take 1 tablet (40 mg total) by mouth daily.   multivitamin with minerals Tabs tablet Take 1 tablet by mouth daily.   NITROSTAT 0.4 MG SL tablet Generic drug:  nitroGLYCERIN Place 1 tablet under the tongue every 5 (five) minutes x 3 doses as needed for chest pain.   oxyCODONE-acetaminophen 5-325 MG tablet Commonly known as:  PERCOCET/ROXICET Take 1 tablet by mouth every 4 (four) hours as needed for moderate pain.   Vitamin D3 5000 units Caps Take 10,000 Units by mouth daily.      Verbal and written Discharge instructions given to the patient. Wound care per Discharge AVS Follow-up Information    Elam Dutch, MD In 3 weeks.   Specialties:  Vascular Surgery, Cardiology Why:  Office will  call you to arrange your appt (sent) Contact information: [address] [place] [address] [phone]           Signed: Laurence Slate [facility] 07/06/2017, 7:24 AM - For VQI Registry use --- Instructions: Press F2 to tab through selections.  Delete question if not applicable.   Post-op:  Time to Extubation: [x ] In OR, [ ]  < 12 hrs, [ ]  12-24 hrs, [ ]  >=24 hrs Vasopressors Req. Post-op: No ICU Stay: 3 days Transfusion: No  If yes, 0 units given MI: [ ]  No, [ ]  Troponin only, [ ]  EKG or Clinical New Arrhythmia: No  Complications: CHF: No Resp failure: [ x] none, [ ]  Pneumonia, [ ]   Ventilator Chg in renal function: [ x] none, [ ]  Inc. Cr > 0.5, [ ]  Temp. Dialysis, [ ]  Permanent dialysis Leg ischemia: [x ] No, [ ]  Yes, no Surgery needed, [ ]  Yes, Surgery needed, [ ]  Amputation Bowel ischemia: [x ] No, [ ]  Medical Rx, [ ]  Surgical Rx Wound complication: [x ] No, [ ]  Superficial separation/infection, [ ]  Return to OR Return to OR: No  Return to OR for bleeding: No Stroke: [x ] None, [ ]  Minor, [ ]  Major  Discharge medications: Statin use:  Yes ASA use:  Yes Plavix use:  No  for medical reason   Beta blocker use: Yes

## 2017-07-06 NOTE — Care Management Note (Signed)
Case Management Note Original Note Created  Zenon Mayo, RN 06/29/2017, 12:58 PM    Patient Details  Name: Nicolas Butler MRN: [id number] Date of Birth: [birth year]  Subjective/Objective:     From home alone, pta indep POD # 1 Open Repair of infrarenalabdominal aortic aneurysm , he has PCP and medication coverage, he did not use any assistive devices at home, his daughter works here as a Therapist, sports also.  He states she will be checking on him after discharge, but he will not have anyone staying with him.  He has been ambulating,  PT eval ordered, await recs.                 Action/Plan: NCM will follow for dc needs.   Expected Discharge Date:  07/06/17               Expected Discharge Plan:  Home/Self Care  In-House Referral:  NA  Discharge planning Services  CM Consult  Post Acute Care Choice:  NA Choice offered to:  NA  DME Arranged:    DME Agency:     HH Arranged:    HH Agency:     Status of Service:  Completed, signed off  If discussed at [employer] of Stay Meetings, dates discussed:    Discharge Disposition: home/self care  Additional Comments:  07/06/17- 1000- Marvetta Gibbons RN, CM- pt for d/c home today with daughter no HH or DME needs noted for discharge.   Dahlia Client Iaeger, RN 07/06/2017, 10:31 AM [phone]

## 2017-07-06 NOTE — Care Management Important Message (Signed)
Important Message  Patient Details  Name: Nicolas Butler MRN: [id number] Date of Birth: 1951-05-26   Medicare Important Message Given:  Yes    Ashar Lewinski Abena 07/06/2017, 9:25 AM

## 2017-07-22 ENCOUNTER — Encounter: Payer: PPO | Admitting: Vascular Surgery

## 2017-08-05 DIAGNOSIS — Z6827 Body mass index (BMI) 27.0-27.9, adult: Secondary | ICD-10-CM | POA: Diagnosis not present

## 2017-08-05 DIAGNOSIS — E782 Mixed hyperlipidemia: Secondary | ICD-10-CM | POA: Diagnosis not present

## 2017-08-05 DIAGNOSIS — N183 Chronic kidney disease, stage 3 (moderate): Secondary | ICD-10-CM | POA: Diagnosis not present

## 2017-08-05 DIAGNOSIS — I714 Abdominal aortic aneurysm, without rupture: Secondary | ICD-10-CM | POA: Diagnosis not present

## 2017-08-05 DIAGNOSIS — I10 Essential (primary) hypertension: Secondary | ICD-10-CM | POA: Diagnosis not present

## 2017-08-09 ENCOUNTER — Ambulatory Visit: Payer: PPO | Admitting: Cardiology

## 2017-08-19 ENCOUNTER — Encounter: Payer: Self-pay | Admitting: Vascular Surgery

## 2017-08-19 ENCOUNTER — Ambulatory Visit (INDEPENDENT_AMBULATORY_CARE_PROVIDER_SITE_OTHER): Payer: PPO | Admitting: Vascular Surgery

## 2017-08-19 VITALS — BP 126/83 | HR 61 | Temp 97.9°F | Resp 16 | Ht 69.0 in | Wt 197.0 lb

## 2017-08-19 DIAGNOSIS — I714 Abdominal aortic aneurysm, without rupture, unspecified: Secondary | ICD-10-CM

## 2017-08-19 NOTE — Progress Notes (Signed)
Patient is a 66 year old male who is status post aortic aneurysm repair via open technique to over 22nd 2018.  He reports no incisional drainage.  He is back to eating.  He feels he is regaining his strength.  His repair was with a tube graft.  He has a known slightly enlarged right common iliac artery but this is only 2 cm in diameter.  Physical exam:  Vitals:   08/19/17 1223  BP: 126/83  Pulse: 61  Resp: 16  Temp: 97.9 F (36.6 C)  TempSrc: Oral  SpO2: 95%  Weight: 197 lb (89.4 kg)  Height: 5\' 9"  (1.753 m)    Abdomen: Well-healed laparotomy incision no hernia defect, 2+ femoral pulses bilaterally  Assessment: Doing well status post abdominal aortic aneurysm repair.  Plan: The patient will have a follow-up CT scan of the abdomen and pelvis in about 5 years to reassess his iliac artery aneurysm and the integrity of his graft.  [name], MD [facility] [place] Office: [phone] Pager: [phone]

## 2017-10-13 ENCOUNTER — Other Ambulatory Visit: Payer: Self-pay

## 2017-10-13 ENCOUNTER — Ambulatory Visit: Payer: PPO | Admitting: Cardiology

## 2017-10-13 ENCOUNTER — Encounter: Payer: Self-pay | Admitting: Cardiology

## 2017-10-13 VITALS — BP 122/73 | HR 56 | Ht 68.0 in | Wt 200.0 lb

## 2017-10-13 DIAGNOSIS — I5022 Chronic systolic (congestive) heart failure: Secondary | ICD-10-CM

## 2017-10-13 DIAGNOSIS — I2510 Atherosclerotic heart disease of native coronary artery without angina pectoris: Secondary | ICD-10-CM

## 2017-10-13 DIAGNOSIS — E782 Mixed hyperlipidemia: Secondary | ICD-10-CM

## 2017-10-13 NOTE — Progress Notes (Signed)
Clinical Summary Nicolas Butler is a 67 y.o.male seen today for follow up of the following medical problems.   1.Chronic Systolic Heart failure  - echo showed LVEF 40-45%, mod LVH, abnormal but ungraded diastolic function, reported akinesis of the basal anterolateral, mid-anterolateral, and apical lateral wall segments.  - lexiscan MPI showed moderate sized interoapical and inferolateral infarct with mild peri-infarct ischemia.  - repeat echo 04/2017 LVEF 43-15%, grade I diastolic dysfunction  - no recent SOB/DOE. No recent edema. Some fatigue after recent surgery.  - compliant with meds  2 Presumed CAD - based on echo and MPI results, MPI with suggestion of prior infarcts with only mild peri-infarct ischemia. This has been medically managed.  - 03/2017 Lexiscan with inferior/inferoapical defect with mild to moderate peri-infarct ischemia.     - no recent chest pain   3. AAA - followed by vascular - s/p open repair 06/2017   4. CKD III - followed by nephrology, baseline 1.4-1.6  5. Hyperlipidemia - 2017 labs TC 174 TG 141 HDL 29 LDL 117. He reports he was off the statin at time.  - takes atorvastatin 2 days a week due to side effects     Upcoming labs with pcp  SH: retired from [employer] 2 years ago. Grandkids x2 one is 14 and one is 5, he has 2 children. Daughter works as Marine scientist at [employer] History:  Diagnosis Date  . AAA (abdominal aortic aneurysm) ([place])   . Chronic kidney disease (CKD), stage III (moderate) (HCC)   . High cholesterol   . Unspecified essential hypertension      Allergies  Allergen Reactions  . Hydrochlorothiazide Other (See Comments)    Pt states "it made my legs hurt"  . Atorvastatin Other (See Comments)    Muscle pain  . Pravastatin Other (See Comments)    Muscle pain     Current Outpatient Medications  Medication Sig Dispense Refill  . amLODipine (NORVASC) 10 MG tablet Take 10 mg by mouth daily.  3  .  aspirin EC 81 MG tablet Take 1 tablet (81 mg total) by mouth daily.    Marland Kitchen atorvastatin (LIPITOR) 80 MG tablet Take 80 mg by mouth daily. Takes 2 times a week    . carvedilol (COREG) 3.125 MG tablet Take 1 tablet (3.125 mg total) by mouth 2 (two) times daily. 60 tablet 6  . Cholecalciferol (VITAMIN D3) 5000 units CAPS Take 10,000 Units by mouth daily.    Marland Kitchen lisinopril (PRINIVIL,ZESTRIL) 40 MG tablet Take 1 tablet (40 mg total) by mouth daily. 30 tablet 6  . Multiple Vitamin (MULTIVITAMIN WITH MINERALS) TABS tablet Take 1 tablet by mouth daily.    Marland Kitchen NITROSTAT 0.4 MG SL tablet Place 1 tablet under the tongue every 5 (five) minutes x 3 doses as needed for chest pain.     Marland Kitchen oxyCODONE-acetaminophen (PERCOCET/ROXICET) 5-325 MG tablet Take 1 tablet by mouth every 4 (four) hours as needed for moderate pain. 20 tablet 0   No current facility-administered medications for this visit.      Past Surgical History:  Procedure Laterality Date  . ABDOMINAL AORTIC ANEURYSM REPAIR N/A 06/28/2017   Procedure: ANEURYSM ABDOMINAL AORTIC REPAIR OPEN;  Surgeon: Elam Dutch, MD;  Location: [facility] OR;  Service: Vascular;  Laterality: N/A;  . HAND SURGERY Left   . INCISIONAL HERNIA REPAIR     LEFT  . IRRIGATION AND DEBRIDEMENT SEBACEOUS CYST     FROM RIGHT SHOULDER  .  UMBILICAL HERNIA REPAIR       Allergies  Allergen Reactions  . Hydrochlorothiazide Other (See Comments)    Pt states "it made my legs hurt"  . Atorvastatin Other (See Comments)    Muscle pain  . Pravastatin Other (See Comments)    Muscle pain      Family History  Problem Relation Age of Onset  . Pneumonia Mother   . Other Father        brain tumor  . Diabetes Son      Social History Nicolas Butler reports that he quit smoking about 4 years ago. His smoking use included cigarettes. He started smoking about 52 years ago. He quit after 46.00 years of use. he has never used smokeless tobacco. Nicolas Butler reports that he does not drink  alcohol.   Review of Systems CONSTITUTIONAL: No weight loss, fever, chills, weakness or fatigue.  HEENT: Eyes: No visual loss, blurred vision, double vision or yellow sclerae.No hearing loss, sneezing, congestion, runny nose or sore throat.  SKIN: No rash or itching.  CARDIOVASCULAR: per hpi RESPIRATORY: No shortness of breath, cough or sputum.  GASTROINTESTINAL: No anorexia, nausea, vomiting or diarrhea. No abdominal pain or blood.  GENITOURINARY: No burning on urination, no polyuria NEUROLOGICAL: No headache, dizziness, syncope, paralysis, ataxia, numbness or tingling in the extremities. No change in bowel or bladder control.  MUSCULOSKELETAL: No muscle, back pain, joint pain or stiffness.  LYMPHATICS: No enlarged nodes. No history of splenectomy.  PSYCHIATRIC: No history of depression or anxiety.  ENDOCRINOLOGIC: No reports of sweating, cold or heat intolerance. No polyuria or polydipsia.  Marland Kitchen   Physical Examination Vitals:   10/13/17 0824  BP: 122/73  Pulse: (!) 56  SpO2: 96%   Vitals:   10/13/17 0824  Weight: 200 lb (90.7 kg)  Height: 5\' 8"  (1.727 m)    Gen: resting comfortably, no acute distress HEENT: no scleral icterus, pupils equal round and reactive, no palptable cervical adenopathy,  CV: RRR, no m//r,g no jvd Resp: Clear to auscultation bilaterally GI: abdomen is soft, non-tender, non-distended, normal bowel sounds, no hepatosplenomegaly MSK: extremities are warm, no edema.  Skin: warm, no rash Neuro:  no focal deficits Psych: appropriate affect   Diagnostic Studies  02/2014 MPI Raw images showed appropriate radiotracer uptake. There is a  moderate-sized mildly reversible inferoapical and inferolateral wall  defect. There are no other myocardial perfusion defects. There is  mild inferolateral wall hypokinesis.  Gated imaging shows end-diastolic volume 409 mL, and systolic volume  90 mL, left ventricular ejection fraction 40%.  IMPRESSION:  1.  Abnormal Lexiscan MPI  2. 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  3. Decreased left ventricular systolic function, LVEF 81%  4. Increased study for major cardiac events due to decreased LV  systolic function, there is fairly mild myocardium at jeopardy   02/2014 Echo LVEF 40-45%, abnormal diastolic function. Multiple WMAs,  03/2017 MPI  Nuclear stress EF: 61%.  There was no ST segment deviation noted during stress.  Defect 1: There is a defect present in the basal inferior, basal inferolateral, mid inferior, mid inferolateral and apical inferior location.  Findings consistent with ischemia.  This is an intermediate risk study.  The left ventricular ejection fraction is normal (55-65%).  There is a large area, moderate severity defect in the basal and mid inferolateral, inferior and apical inferior walls (SDS=6) consistent with ischemia in the LCX territory.   04/2017 echo Study Conclusions  -  Left ventricle: The cavity size was normal. Wall thickness was   increased in a pattern of moderate LVH. Systolic function was   normal. The estimated ejection fraction was in the range of 60%   to 65%. Wall motion was normal; there were no regional wall   motion abnormalities. Doppler parameters are consistent with   abnormal left ventricular relaxation (grade 1 diastolic   dysfunction). - Aortic valve: Valve area (VTI): 3.52 cm^2. Valve area (Vmax):   3.11 cm^2. Valve area (Vmean): 3.12 cm^2. - Aorta: Aortic root dimension: 40 mm (ED). Ascending aortic   diameter: 40 mm (S). - Aortic root: The aortic root was mildly dilated. - Left atrium: The atrium was moderately dilated. - Technically adequate study.  Assessment and Plan  1. Chronic systolic heart failure  - by most recent echo LVEF has normalized.  - MPI shows evidence of possible old infarct, likely a component of ICM, though could be mixed with  a HTN CM as well.  - limited beta blocker titration due to low resting heart rates.  - no aldactone at this time due to borderline renal function  - no recent symptoms, conitnue current meds  - we will repeat echo in setting of possible upcoming vascular surgery and recent abnormal nuclear stress test  2. Presumed CAD  - prior nuclear stress tests including a recent test have shown evidence of prior infarct with peri-infarct ischemia - this has been managed medically in the absence of symptoms combined with his CKD - most recent stress test mild to moderate peri-infarct ishcemia, intermediate risk  - no recent symptoms, cotinue current meds  3. Hyperlipidemia - only taking statin 2 days a week, asked to increase frequency and monitor for side effects.    F/u 6 months   Arnoldo Lenis, M.D.

## 2017-10-13 NOTE — Patient Instructions (Signed)

## 2017-10-15 DIAGNOSIS — I10 Essential (primary) hypertension: Secondary | ICD-10-CM | POA: Diagnosis not present

## 2017-10-15 DIAGNOSIS — Z0001 Encounter for general adult medical examination with abnormal findings: Secondary | ICD-10-CM | POA: Diagnosis not present

## 2017-10-15 DIAGNOSIS — Z6829 Body mass index (BMI) 29.0-29.9, adult: Secondary | ICD-10-CM | POA: Diagnosis not present

## 2017-10-15 DIAGNOSIS — E782 Mixed hyperlipidemia: Secondary | ICD-10-CM | POA: Diagnosis not present

## 2017-10-15 DIAGNOSIS — I714 Abdominal aortic aneurysm, without rupture: Secondary | ICD-10-CM | POA: Diagnosis not present

## 2017-10-15 DIAGNOSIS — N183 Chronic kidney disease, stage 3 (moderate): Secondary | ICD-10-CM | POA: Diagnosis not present

## 2017-10-16 ENCOUNTER — Encounter: Payer: Self-pay | Admitting: Cardiology

## 2017-12-15 DIAGNOSIS — Z6829 Body mass index (BMI) 29.0-29.9, adult: Secondary | ICD-10-CM | POA: Diagnosis not present

## 2017-12-15 DIAGNOSIS — E782 Mixed hyperlipidemia: Secondary | ICD-10-CM | POA: Diagnosis not present

## 2017-12-15 DIAGNOSIS — I10 Essential (primary) hypertension: Secondary | ICD-10-CM | POA: Diagnosis not present

## 2017-12-15 DIAGNOSIS — I714 Abdominal aortic aneurysm, without rupture: Secondary | ICD-10-CM | POA: Diagnosis not present

## 2017-12-15 DIAGNOSIS — K439 Ventral hernia without obstruction or gangrene: Secondary | ICD-10-CM | POA: Diagnosis not present

## 2017-12-15 DIAGNOSIS — N183 Chronic kidney disease, stage 3 (moderate): Secondary | ICD-10-CM | POA: Diagnosis not present

## 2018-01-03 DIAGNOSIS — Z86010 Personal history of colonic polyps: Secondary | ICD-10-CM | POA: Insufficient documentation

## 2018-01-03 DIAGNOSIS — K432 Incisional hernia without obstruction or gangrene: Secondary | ICD-10-CM | POA: Insufficient documentation

## 2018-01-06 DIAGNOSIS — D3501 Benign neoplasm of right adrenal gland: Secondary | ICD-10-CM | POA: Diagnosis not present

## 2018-01-06 DIAGNOSIS — K432 Incisional hernia without obstruction or gangrene: Secondary | ICD-10-CM | POA: Diagnosis not present

## 2018-01-06 DIAGNOSIS — D3502 Benign neoplasm of left adrenal gland: Secondary | ICD-10-CM | POA: Diagnosis not present

## 2018-01-06 DIAGNOSIS — N261 Atrophy of kidney (terminal): Secondary | ICD-10-CM | POA: Diagnosis not present

## 2018-01-06 DIAGNOSIS — K439 Ventral hernia without obstruction or gangrene: Secondary | ICD-10-CM | POA: Diagnosis not present

## 2018-01-06 DIAGNOSIS — I714 Abdominal aortic aneurysm, without rupture: Secondary | ICD-10-CM | POA: Diagnosis not present

## 2018-01-25 DIAGNOSIS — D123 Benign neoplasm of transverse colon: Secondary | ICD-10-CM | POA: Diagnosis not present

## 2018-01-25 DIAGNOSIS — I129 Hypertensive chronic kidney disease with stage 1 through stage 4 chronic kidney disease, or unspecified chronic kidney disease: Secondary | ICD-10-CM | POA: Diagnosis not present

## 2018-01-25 DIAGNOSIS — K641 Second degree hemorrhoids: Secondary | ICD-10-CM | POA: Diagnosis not present

## 2018-01-25 DIAGNOSIS — K621 Rectal polyp: Secondary | ICD-10-CM | POA: Diagnosis not present

## 2018-01-25 DIAGNOSIS — Z09 Encounter for follow-up examination after completed treatment for conditions other than malignant neoplasm: Secondary | ICD-10-CM | POA: Diagnosis not present

## 2018-01-25 DIAGNOSIS — D128 Benign neoplasm of rectum: Secondary | ICD-10-CM | POA: Diagnosis not present

## 2018-01-25 DIAGNOSIS — Z1211 Encounter for screening for malignant neoplasm of colon: Secondary | ICD-10-CM | POA: Diagnosis not present

## 2018-01-25 DIAGNOSIS — I10 Essential (primary) hypertension: Secondary | ICD-10-CM | POA: Diagnosis not present

## 2018-01-25 DIAGNOSIS — Z888 Allergy status to other drugs, medicaments and biological substances status: Secondary | ICD-10-CM | POA: Diagnosis not present

## 2018-01-25 DIAGNOSIS — Z86010 Personal history of colonic polyps: Secondary | ICD-10-CM | POA: Diagnosis not present

## 2018-01-25 DIAGNOSIS — N189 Chronic kidney disease, unspecified: Secondary | ICD-10-CM | POA: Diagnosis not present

## 2018-01-25 DIAGNOSIS — Z87891 Personal history of nicotine dependence: Secondary | ICD-10-CM | POA: Diagnosis not present

## 2018-01-25 DIAGNOSIS — Q438 Other specified congenital malformations of intestine: Secondary | ICD-10-CM | POA: Diagnosis not present

## 2018-01-25 DIAGNOSIS — Z79899 Other long term (current) drug therapy: Secondary | ICD-10-CM | POA: Diagnosis not present

## 2018-02-09 DIAGNOSIS — K432 Incisional hernia without obstruction or gangrene: Secondary | ICD-10-CM | POA: Diagnosis not present

## 2018-02-09 DIAGNOSIS — D126 Benign neoplasm of colon, unspecified: Secondary | ICD-10-CM | POA: Diagnosis not present

## 2018-02-10 DIAGNOSIS — D126 Benign neoplasm of colon, unspecified: Secondary | ICD-10-CM | POA: Insufficient documentation

## 2018-02-25 DIAGNOSIS — K432 Incisional hernia without obstruction or gangrene: Secondary | ICD-10-CM | POA: Diagnosis not present

## 2018-02-25 DIAGNOSIS — R0602 Shortness of breath: Secondary | ICD-10-CM | POA: Diagnosis not present

## 2018-02-25 DIAGNOSIS — I10 Essential (primary) hypertension: Secondary | ICD-10-CM | POA: Diagnosis not present

## 2018-02-27 DIAGNOSIS — E876 Hypokalemia: Secondary | ICD-10-CM | POA: Diagnosis not present

## 2018-02-27 DIAGNOSIS — K567 Ileus, unspecified: Secondary | ICD-10-CM | POA: Diagnosis not present

## 2018-02-27 DIAGNOSIS — I160 Hypertensive urgency: Secondary | ICD-10-CM | POA: Diagnosis not present

## 2018-02-27 DIAGNOSIS — K432 Incisional hernia without obstruction or gangrene: Secondary | ICD-10-CM | POA: Diagnosis not present

## 2018-02-27 DIAGNOSIS — J449 Chronic obstructive pulmonary disease, unspecified: Secondary | ICD-10-CM | POA: Diagnosis not present

## 2018-02-27 DIAGNOSIS — N179 Acute kidney failure, unspecified: Secondary | ICD-10-CM | POA: Diagnosis not present

## 2018-02-27 DIAGNOSIS — Z87891 Personal history of nicotine dependence: Secondary | ICD-10-CM | POA: Diagnosis not present

## 2018-02-27 DIAGNOSIS — N183 Chronic kidney disease, stage 3 (moderate): Secondary | ICD-10-CM | POA: Diagnosis not present

## 2018-02-27 DIAGNOSIS — I129 Hypertensive chronic kidney disease with stage 1 through stage 4 chronic kidney disease, or unspecified chronic kidney disease: Secondary | ICD-10-CM | POA: Diagnosis not present

## 2018-02-27 DIAGNOSIS — E860 Dehydration: Secondary | ICD-10-CM | POA: Diagnosis not present

## 2018-02-27 DIAGNOSIS — J189 Pneumonia, unspecified organism: Secondary | ICD-10-CM | POA: Diagnosis not present

## 2018-02-27 DIAGNOSIS — D649 Anemia, unspecified: Secondary | ICD-10-CM | POA: Diagnosis not present

## 2018-02-27 DIAGNOSIS — M6281 Muscle weakness (generalized): Secondary | ICD-10-CM | POA: Diagnosis not present

## 2018-02-27 DIAGNOSIS — J441 Chronic obstructive pulmonary disease with (acute) exacerbation: Secondary | ICD-10-CM | POA: Diagnosis not present

## 2018-02-27 DIAGNOSIS — J690 Pneumonitis due to inhalation of food and vomit: Secondary | ICD-10-CM | POA: Diagnosis not present

## 2018-02-27 DIAGNOSIS — I10 Essential (primary) hypertension: Secondary | ICD-10-CM | POA: Diagnosis not present

## 2018-03-08 DIAGNOSIS — I714 Abdominal aortic aneurysm, without rupture: Secondary | ICD-10-CM | POA: Diagnosis not present

## 2018-03-08 DIAGNOSIS — I10 Essential (primary) hypertension: Secondary | ICD-10-CM | POA: Diagnosis not present

## 2018-03-08 DIAGNOSIS — Z48815 Encounter for surgical aftercare following surgery on the digestive system: Secondary | ICD-10-CM | POA: Diagnosis not present

## 2018-03-08 DIAGNOSIS — J189 Pneumonia, unspecified organism: Secondary | ICD-10-CM | POA: Diagnosis not present

## 2018-03-08 DIAGNOSIS — Z87891 Personal history of nicotine dependence: Secondary | ICD-10-CM | POA: Diagnosis not present

## 2018-03-14 DIAGNOSIS — N183 Chronic kidney disease, stage 3 (moderate): Secondary | ICD-10-CM | POA: Diagnosis not present

## 2018-03-14 DIAGNOSIS — E782 Mixed hyperlipidemia: Secondary | ICD-10-CM | POA: Diagnosis not present

## 2018-03-14 DIAGNOSIS — J449 Chronic obstructive pulmonary disease, unspecified: Secondary | ICD-10-CM | POA: Diagnosis not present

## 2018-03-14 DIAGNOSIS — Z6829 Body mass index (BMI) 29.0-29.9, adult: Secondary | ICD-10-CM | POA: Diagnosis not present

## 2018-03-14 DIAGNOSIS — I10 Essential (primary) hypertension: Secondary | ICD-10-CM | POA: Diagnosis not present

## 2018-03-22 DIAGNOSIS — I714 Abdominal aortic aneurysm, without rupture: Secondary | ICD-10-CM | POA: Diagnosis not present

## 2018-03-22 DIAGNOSIS — J189 Pneumonia, unspecified organism: Secondary | ICD-10-CM | POA: Diagnosis not present

## 2018-03-22 DIAGNOSIS — D649 Anemia, unspecified: Secondary | ICD-10-CM | POA: Diagnosis not present

## 2018-03-22 DIAGNOSIS — N183 Chronic kidney disease, stage 3 (moderate): Secondary | ICD-10-CM | POA: Diagnosis not present

## 2018-03-22 DIAGNOSIS — I10 Essential (primary) hypertension: Secondary | ICD-10-CM | POA: Diagnosis not present

## 2018-03-22 DIAGNOSIS — Z48815 Encounter for surgical aftercare following surgery on the digestive system: Secondary | ICD-10-CM | POA: Diagnosis not present

## 2018-03-22 DIAGNOSIS — E876 Hypokalemia: Secondary | ICD-10-CM | POA: Diagnosis not present

## 2018-03-22 DIAGNOSIS — Z87891 Personal history of nicotine dependence: Secondary | ICD-10-CM | POA: Diagnosis not present

## 2018-04-01 DIAGNOSIS — R300 Dysuria: Secondary | ICD-10-CM | POA: Diagnosis not present

## 2018-04-01 DIAGNOSIS — Z6828 Body mass index (BMI) 28.0-28.9, adult: Secondary | ICD-10-CM | POA: Diagnosis not present

## 2018-04-06 DIAGNOSIS — E782 Mixed hyperlipidemia: Secondary | ICD-10-CM | POA: Diagnosis not present

## 2018-04-06 DIAGNOSIS — N183 Chronic kidney disease, stage 3 (moderate): Secondary | ICD-10-CM | POA: Diagnosis not present

## 2018-04-06 DIAGNOSIS — J449 Chronic obstructive pulmonary disease, unspecified: Secondary | ICD-10-CM | POA: Diagnosis not present

## 2018-04-06 DIAGNOSIS — Z6829 Body mass index (BMI) 29.0-29.9, adult: Secondary | ICD-10-CM | POA: Diagnosis not present

## 2018-04-06 DIAGNOSIS — I10 Essential (primary) hypertension: Secondary | ICD-10-CM | POA: Diagnosis not present

## 2018-04-07 DIAGNOSIS — J441 Chronic obstructive pulmonary disease with (acute) exacerbation: Secondary | ICD-10-CM | POA: Diagnosis not present

## 2018-04-07 DIAGNOSIS — M6281 Muscle weakness (generalized): Secondary | ICD-10-CM | POA: Diagnosis not present

## 2018-04-14 DIAGNOSIS — K439 Ventral hernia without obstruction or gangrene: Secondary | ICD-10-CM | POA: Diagnosis not present

## 2018-04-14 DIAGNOSIS — E782 Mixed hyperlipidemia: Secondary | ICD-10-CM | POA: Diagnosis not present

## 2018-04-14 DIAGNOSIS — Z6829 Body mass index (BMI) 29.0-29.9, adult: Secondary | ICD-10-CM | POA: Diagnosis not present

## 2018-04-14 DIAGNOSIS — Z681 Body mass index (BMI) 19 or less, adult: Secondary | ICD-10-CM | POA: Diagnosis not present

## 2018-04-14 DIAGNOSIS — I714 Abdominal aortic aneurysm, without rupture: Secondary | ICD-10-CM | POA: Diagnosis not present

## 2018-04-14 DIAGNOSIS — N183 Chronic kidney disease, stage 3 (moderate): Secondary | ICD-10-CM | POA: Diagnosis not present

## 2018-04-14 DIAGNOSIS — I10 Essential (primary) hypertension: Secondary | ICD-10-CM | POA: Diagnosis not present

## 2018-04-18 ENCOUNTER — Encounter: Payer: Self-pay | Admitting: Cardiology

## 2018-04-18 ENCOUNTER — Ambulatory Visit: Payer: PPO | Admitting: Cardiology

## 2018-04-18 ENCOUNTER — Other Ambulatory Visit: Payer: Self-pay

## 2018-04-18 VITALS — BP 133/71 | HR 58 | Ht 69.0 in | Wt 195.0 lb

## 2018-04-18 DIAGNOSIS — I2510 Atherosclerotic heart disease of native coronary artery without angina pectoris: Secondary | ICD-10-CM

## 2018-04-18 DIAGNOSIS — N183 Chronic kidney disease, stage 3 unspecified: Secondary | ICD-10-CM

## 2018-04-18 DIAGNOSIS — E782 Mixed hyperlipidemia: Secondary | ICD-10-CM | POA: Diagnosis not present

## 2018-04-18 DIAGNOSIS — I5022 Chronic systolic (congestive) heart failure: Secondary | ICD-10-CM

## 2018-04-18 NOTE — Patient Instructions (Signed)

## 2018-04-18 NOTE — Progress Notes (Signed)
Clinical Summary Nicolas Butler is a 67 y.o.male seen today for follow up of the following medical problems.   1.Chronic Systolic Heart failure  - echo showed LVEF 40-45%, mod LVH, abnormal but ungraded diastolic function, reported akinesis of the basal anterolateral, mid-anterolateral, and apical lateral wall segments.  - lexiscan MPI showed moderate sized interoapical and inferolateral infarct with mild peri-infarct ischemia.  - repeat echo 04/2017 LVEF 29-56%, grade I diastolic dysfunction  - no recent SOB/DOE. No recent edema. Some fatigue after recent surgery.  - compliant with meds   - no recent SOB/DOE, no chest pain. SOme fatigue from complicated hernia surgery complicated by extended admission for AKI and what sounds like ileus. These issues have since resolved.  - no recent edema.    2Presumed CAD - based on echo and MPI results, MPI with suggestion of prior infarcts with only mild peri-infarct ischemia. This has been medically managed.  - 03/2017 Lexiscan with inferior/inferoapical defect with mild to moderate peri-infarct ischemia.   - he denies any chest pain.   3. AAA - followed by vascular - s/p open repair 06/2017   4. CKD III - followed by nephrology, baseline 1.4-1.6 - most recent labs with pcp. He reports AKI after hernia surgery where Cr went up to 5. Since that time he reports last check Cr was down to 2.   5. Hyperlipidemia - 2017 labs TC 174 TG 141 HDL 29 LDL 117. He reports he was off the statin at time.   - tolerating lipirtor 80mg  every other day. Tolerating. Did not tolerate daily atorvastatin.      SH: retired from [employer] 2 years ago. Grandkids x2 one is 14 and one is 5, he has 2 children. Daughter works as Marine scientist at [employer] History:  Diagnosis Date  . AAA (abdominal aortic aneurysm) ([place])   . Chronic kidney disease (CKD), stage III (moderate) ([facility])   . High cholesterol   . Unspecified essential  hypertension      Allergies  Allergen Reactions  . Hydrochlorothiazide Other (See Comments)    Pt states "it made my legs hurt"  . Atorvastatin Other (See Comments)    Muscle pain  . Pravastatin Other (See Comments)    Muscle pain     Current Outpatient Medications  Medication Sig Dispense Refill  . amLODipine (NORVASC) 10 MG tablet Take 10 mg by mouth daily.  3  . aspirin EC 81 MG tablet Take 1 tablet (81 mg total) by mouth daily.    Marland Kitchen atorvastatin (LIPITOR) 80 MG tablet Take 80 mg by mouth daily. Takes 2 times a week    . carvedilol (COREG) 3.125 MG tablet Take 1 tablet (3.125 mg total) by mouth 2 (two) times daily. 60 tablet 6  . Cholecalciferol (VITAMIN D3) 5000 units CAPS Take 10,000 Units by mouth daily.    Marland Kitchen lisinopril (PRINIVIL,ZESTRIL) 40 MG tablet Take 1 tablet (40 mg total) by mouth daily. 30 tablet 6  . Multiple Vitamin (MULTIVITAMIN WITH MINERALS) TABS tablet Take 1 tablet by mouth daily.    Marland Kitchen NITROSTAT 0.4 MG SL tablet Place 1 tablet under the tongue every 5 (five) minutes x 3 doses as needed for chest pain.      No current facility-administered medications for this visit.      Past Surgical History:  Procedure Laterality Date  . ABDOMINAL AORTIC ANEURYSM REPAIR N/A 06/28/2017   Procedure: ANEURYSM ABDOMINAL AORTIC REPAIR OPEN;  Surgeon: Elam Dutch,  MD;  Location: [facility] OR;  Service: Vascular;  Laterality: N/A;  . HAND SURGERY Left   . INCISIONAL HERNIA REPAIR     LEFT  . IRRIGATION AND DEBRIDEMENT SEBACEOUS CYST     FROM RIGHT SHOULDER  . UMBILICAL HERNIA REPAIR       Allergies  Allergen Reactions  . Hydrochlorothiazide Other (See Comments)    Pt states "it made my legs hurt"  . Atorvastatin Other (See Comments)    Muscle pain  . Pravastatin Other (See Comments)    Muscle pain      Family History  Problem Relation Age of Onset  . Pneumonia Mother   . Other Father        brain tumor  . Diabetes Son      Social History Nicolas Butler  reports that he quit smoking about 5 years ago. His smoking use included cigarettes. He started smoking about 52 years ago. He quit after 46.00 years of use. He has never used smokeless tobacco. Nicolas Butler reports that he does not drink alcohol.   Review of Systems CONSTITUTIONAL: No weight loss, fever, chills, weakness or fatigue.  HEENT: Eyes: No visual loss, blurred vision, double vision or yellow sclerae.No hearing loss, sneezing, congestion, runny nose or sore throat.  SKIN: No rash or itching.  CARDIOVASCULAR: per hpi RESPIRATORY: No shortness of breath, cough or sputum.  GASTROINTESTINAL: No anorexia, nausea, vomiting or diarrhea. No abdominal pain or blood.  GENITOURINARY: No burning on urination, no polyuria NEUROLOGICAL: No headache, dizziness, syncope, paralysis, ataxia, numbness or tingling in the extremities. No change in bowel or bladder control.  MUSCULOSKELETAL: No muscle, back pain, joint pain or stiffness.  LYMPHATICS: No enlarged nodes. No history of splenectomy.  PSYCHIATRIC: No history of depression or anxiety.  ENDOCRINOLOGIC: No reports of sweating, cold or heat intolerance. No polyuria or polydipsia.  Marland Kitchen   Physical Examination Vitals:   04/18/18 0808  BP: 133/71  Pulse: (!) 58  SpO2: 95%   Vitals:   04/18/18 0808  Weight: 195 lb (88.5 kg)  Height: 5\' 9"  (1.753 m)    Gen: resting comfortably, no acute distress HEENT: no scleral icterus, pupils equal round and reactive, no palptable cervical adenopathy,  CV: RRR, no m/r/g, no jvd Resp: Clear to auscultation bilaterally GI: abdomen is soft, non-tender, non-distended, normal bowel sounds, no hepatosplenomegaly MSK: extremities are warm, no edema.  Skin: warm, no rash Neuro:  no focal deficits Psych: appropriate affect   Diagnostic Studies 02/2014 MPI Raw images showed appropriate radiotracer uptake. There is a  moderate-sized mildly reversible inferoapical and inferolateral wall  defect. There are  no other myocardial perfusion defects. There is  mild inferolateral wall hypokinesis.  Gated imaging shows end-diastolic volume 962 mL, and systolic volume  90 mL, left ventricular ejection fraction 40%.  IMPRESSION:  1. Abnormal Lexiscan MPI  2. 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  3. Decreased left ventricular systolic function, LVEF 95%  4. Increased study for major cardiac events due to decreased LV  systolic function, there is fairly mild myocardium at jeopardy   02/2014 Echo LVEF 40-45%, abnormal diastolic function. Multiple WMAs,  03/2017 MPI  Nuclear stress EF: 61%.  There was no ST segment deviation noted during stress.  Defect 1: There is a defect present in the basal inferior, basal inferolateral, mid inferior, mid inferolateral and apical inferior location.  Findings consistent with ischemia.  This is an intermediate risk study.  The  left ventricular ejection fraction is normal (55-65%).  There is a large area, moderate severity defect in the basal and mid inferolateral, inferior and apical inferior walls (SDS=6) consistent with ischemia in the LCX territory.  04/2017 echo Study Conclusions  - Left ventricle: The cavity size was normal. Wall thickness was increased in a pattern of moderate LVH. Systolic function was normal. The estimated ejection fraction was in the range of 60% to 65%. Wall motion was normal; there were no regional wall motion abnormalities. Doppler parameters are consistent with abnormal left ventricular relaxation (grade 1 diastolic dysfunction). - Aortic valve: Valve area (VTI): 3.52 cm^2. Valve area (Vmax): 3.11 cm^2. Valve area (Vmean): 3.12 cm^2. - Aorta: Aortic root dimension: 40 mm (ED). Ascending aortic diameter: 40 mm (S). - Aortic root: The aortic root was mildly dilated. - Left atrium: The atrium was moderately dilated. -  Technically adequate study.    Assessment and Plan   1. Chronic systolic heart failure  - by most recent echo LVEF has normalized.  - MPI shows evidence of possible old infarct, likely a component of ICM, though could be mixed with a HTN CM as well.  - limited beta blocker titration due to low resting heart rates.  - no aldactone at this time due to borderline renal function  - no recent symptoms, we will continue current meds.   2. Presumed CAD  -prior nuclear stress tests including a recent test have shown evidence of prior infarct with peri-infarct ischemia - this has been managed medically in the absence of symptoms combined with his CKD - most recent stress test mild to moderate peri-infarct ishcemia, intermediate risk  - no symptoms, continue medical therapy at this time.   3.Hyperlipidemia - continue every other day atorvastatin, did not tolerate daily dosing. Request labs from pcp  4. CKD III - request labs from pcp, continue to monitor.    F/u 6 months     Arnoldo Lenis, M.D.

## 2018-05-08 DIAGNOSIS — M6281 Muscle weakness (generalized): Secondary | ICD-10-CM | POA: Diagnosis not present

## 2018-05-08 DIAGNOSIS — J441 Chronic obstructive pulmonary disease with (acute) exacerbation: Secondary | ICD-10-CM | POA: Diagnosis not present

## 2018-06-07 DIAGNOSIS — J441 Chronic obstructive pulmonary disease with (acute) exacerbation: Secondary | ICD-10-CM | POA: Diagnosis not present

## 2018-06-07 DIAGNOSIS — M6281 Muscle weakness (generalized): Secondary | ICD-10-CM | POA: Diagnosis not present

## 2018-07-08 DIAGNOSIS — J441 Chronic obstructive pulmonary disease with (acute) exacerbation: Secondary | ICD-10-CM | POA: Diagnosis not present

## 2018-07-08 DIAGNOSIS — M6281 Muscle weakness (generalized): Secondary | ICD-10-CM | POA: Diagnosis not present

## 2018-08-07 DIAGNOSIS — J441 Chronic obstructive pulmonary disease with (acute) exacerbation: Secondary | ICD-10-CM | POA: Diagnosis not present

## 2018-08-07 DIAGNOSIS — M6281 Muscle weakness (generalized): Secondary | ICD-10-CM | POA: Diagnosis not present

## 2018-08-10 DIAGNOSIS — E782 Mixed hyperlipidemia: Secondary | ICD-10-CM | POA: Diagnosis not present

## 2018-08-10 DIAGNOSIS — N183 Chronic kidney disease, stage 3 (moderate): Secondary | ICD-10-CM | POA: Diagnosis not present

## 2018-08-10 DIAGNOSIS — I10 Essential (primary) hypertension: Secondary | ICD-10-CM | POA: Diagnosis not present

## 2018-08-10 DIAGNOSIS — K439 Ventral hernia without obstruction or gangrene: Secondary | ICD-10-CM | POA: Diagnosis not present

## 2018-08-10 DIAGNOSIS — Z6829 Body mass index (BMI) 29.0-29.9, adult: Secondary | ICD-10-CM | POA: Diagnosis not present

## 2018-08-10 DIAGNOSIS — I714 Abdominal aortic aneurysm, without rupture: Secondary | ICD-10-CM | POA: Diagnosis not present

## 2018-09-07 DIAGNOSIS — M6281 Muscle weakness (generalized): Secondary | ICD-10-CM | POA: Diagnosis not present

## 2018-09-07 DIAGNOSIS — J441 Chronic obstructive pulmonary disease with (acute) exacerbation: Secondary | ICD-10-CM | POA: Diagnosis not present

## 2018-10-08 DIAGNOSIS — M6281 Muscle weakness (generalized): Secondary | ICD-10-CM | POA: Diagnosis not present

## 2018-10-08 DIAGNOSIS — J441 Chronic obstructive pulmonary disease with (acute) exacerbation: Secondary | ICD-10-CM | POA: Diagnosis not present

## 2018-11-01 ENCOUNTER — Encounter: Payer: Self-pay | Admitting: Cardiology

## 2018-11-01 ENCOUNTER — Ambulatory Visit: Payer: PPO | Admitting: Cardiology

## 2018-11-01 ENCOUNTER — Encounter: Payer: Self-pay | Admitting: *Deleted

## 2018-11-01 VITALS — BP 139/89 | HR 58 | Ht 69.0 in | Wt 206.2 lb

## 2018-11-01 DIAGNOSIS — N183 Chronic kidney disease, stage 3 unspecified: Secondary | ICD-10-CM

## 2018-11-01 DIAGNOSIS — I10 Essential (primary) hypertension: Secondary | ICD-10-CM | POA: Diagnosis not present

## 2018-11-01 DIAGNOSIS — I5022 Chronic systolic (congestive) heart failure: Secondary | ICD-10-CM | POA: Diagnosis not present

## 2018-11-01 DIAGNOSIS — I2510 Atherosclerotic heart disease of native coronary artery without angina pectoris: Secondary | ICD-10-CM

## 2018-11-01 DIAGNOSIS — E782 Mixed hyperlipidemia: Secondary | ICD-10-CM | POA: Diagnosis not present

## 2018-11-01 NOTE — Patient Instructions (Signed)
Your physician wants you to follow-up in: [place] will receive a reminder letter in the mail two months in advance. If you don't receive a letter, please call our office to schedule the follow-up appointment.  Your physician recommends that you continue on your current medications as directed. Please refer to the Current Medication list given to you today.  Thank you for choosing [facility]!!   DASH Eating Plan DASH stands for "Dietary Approaches to Stop Hypertension." The DASH eating plan is a healthy eating plan that has been shown to reduce high blood pressure (hypertension). It may also reduce your risk for type 2 diabetes, heart disease, and stroke. The DASH eating plan may also help with weight loss. What are tips for following this plan?  General guidelines  Avoid eating more than 2,300 mg (milligrams) of salt (sodium) a day. If you have hypertension, you may need to reduce your sodium intake to 1,500 mg a day.  Limit alcohol intake to no more than 1 drink a day for nonpregnant women and 2 drinks a day for men. One drink equals 12 oz of beer, 5 oz of wine, or 1 oz of hard liquor.  Work with your health care provider to maintain a healthy body weight or to lose weight. Ask what an ideal weight is for you.  Get at least 30 minutes of exercise that causes your heart to beat faster (aerobic exercise) most days of the week. Activities may include walking, swimming, or biking.  Work with your health care provider or diet and nutrition specialist (dietitian) to adjust your eating plan to your individual calorie needs. Reading food labels   Check food labels for the amount of sodium per serving. Choose foods with less than 5 percent of the Daily Value of sodium. Generally, foods with less than 300 mg of sodium per serving fit into this eating plan.  To find whole grains, look for the word "whole" as the first word in the ingredient list. Shopping  Buy  products labeled as "low-sodium" or "no salt added."  Buy fresh foods. Avoid canned foods and premade or frozen meals. Cooking  Avoid adding salt when cooking. Use salt-free seasonings or herbs instead of table salt or sea salt. Check with your health care provider or pharmacist before using salt substitutes.  Do not fry foods. Cook foods using healthy methods such as baking, boiling, grilling, and broiling instead.  Cook with heart-healthy oils, such as olive, canola, soybean, or sunflower oil. Meal planning  Eat a balanced diet that includes: ? 5 or more servings of fruits and vegetables each day. At each meal, try to fill half of your plate with fruits and vegetables. ? Up to 6-8 servings of whole grains each day. ? Less than 6 oz of lean meat, poultry, or fish each day. A 3-oz serving of meat is about the same size as a deck of cards. One egg equals 1 oz. ? 2 servings of low-fat dairy each day. ? A serving of nuts, seeds, or beans 5 times each week. ? Heart-healthy fats. Healthy fats called Omega-3 fatty acids are found in foods such as flaxseeds and coldwater fish, like sardines, salmon, and mackerel.  Limit how much you eat of the following: ? Canned or prepackaged foods. ? Food that is high in trans fat, such as fried foods. ? Food that is high in saturated fat, such as fatty meat. ? Sweets, desserts, sugary drinks, and other foods with  added sugar. ? Full-fat dairy products.  Do not salt foods before eating.  Try to eat at least 2 vegetarian meals each week.  Eat more home-cooked food and less restaurant, buffet, and fast food.  When eating at a restaurant, ask that your food be prepared with less salt or no salt, if possible. What foods are recommended? The items listed may not be a complete list. Talk with your dietitian about what dietary choices are best for you. Grains Whole-grain or whole-wheat bread. Whole-grain or whole-wheat pasta. Brown rice. [place].  Bulgur. Whole-grain and low-sodium cereals. Pita bread. Low-fat, low-sodium crackers. Whole-wheat flour tortillas. Vegetables Fresh or frozen vegetables (raw, steamed, roasted, or grilled). Low-sodium or reduced-sodium tomato and vegetable juice. Low-sodium or reduced-sodium tomato sauce and tomato paste. Low-sodium or reduced-sodium canned vegetables. Fruits All fresh, dried, or frozen fruit. Canned fruit in natural juice (without added sugar). Meat and other protein foods Skinless chicken or [place]. Ground chicken or [place]. Pork with fat trimmed off. Fish and seafood. Egg whites. Dried beans, peas, or lentils. Unsalted nuts, nut butters, and seeds. Unsalted canned beans. Lean cuts of beef with fat trimmed off. Low-sodium, lean deli meat. Dairy Low-fat (1%) or fat-free (skim) milk. Fat-free, low-fat, or reduced-fat cheeses. Nonfat, low-sodium ricotta or cottage cheese. Low-fat or nonfat yogurt. Low-fat, low-sodium cheese. Fats and oils Soft margarine without trans fats. Vegetable oil. Low-fat, reduced-fat, or light mayonnaise and salad dressings (reduced-sodium). Canola, safflower, olive, soybean, and sunflower oils. Avocado. Seasoning and other foods Herbs. Spices. Seasoning mixes without salt. Unsalted popcorn and pretzels. Fat-free sweets. What foods are not recommended? The items listed may not be a complete list. Talk with your dietitian about what dietary choices are best for you. Grains Baked goods made with fat, such as croissants, muffins, or some breads. Dry pasta or rice meal packs. Vegetables Creamed or fried vegetables. Vegetables in a cheese sauce. Regular canned vegetables (not low-sodium or reduced-sodium). Regular canned tomato sauce and paste (not low-sodium or reduced-sodium). Regular tomato and vegetable juice (not low-sodium or reduced-sodium). Angie Fava. Olives. Fruits Canned fruit in a light or heavy syrup. Fried fruit. Fruit in cream or butter sauce. Meat and other  protein foods Fatty cuts of meat. Ribs. Fried meat. Berniece Salines. Sausage. Bologna and other processed lunch meats. Salami. Fatback. Hotdogs. Bratwurst. Salted nuts and seeds. Canned beans with added salt. Canned or smoked fish. Whole eggs or egg yolks. Chicken or [place] with skin. Dairy Whole or 2% milk, cream, and half-and-half. Whole or full-fat cream cheese. Whole-fat or sweetened yogurt. Full-fat cheese. Nondairy creamers. Whipped toppings. Processed cheese and cheese spreads. Fats and oils Butter. Stick margarine. Lard. Shortening. Ghee. Bacon fat. Tropical oils, such as coconut, palm kernel, or palm oil. Seasoning and other foods Salted popcorn and pretzels. Onion salt, garlic salt, seasoned salt, table salt, and sea salt. Worcestershire sauce. Tartar sauce. Barbecue sauce. Teriyaki sauce. Soy sauce, including reduced-sodium. Steak sauce. Canned and packaged gravies. Fish sauce. Oyster sauce. Cocktail sauce. Horseradish that you find on the shelf. Ketchup. Mustard. Meat flavorings and tenderizers. Bouillon cubes. Hot sauce and Tabasco sauce. Premade or packaged marinades. Premade or packaged taco seasonings. Relishes. Regular salad dressings. Where to find more information:  National Heart, Lung, and [place]: https://wilson-eaton.com/  American Heart Association: www.heart.org Summary  The DASH eating plan is a healthy eating plan that has been shown to reduce high blood pressure (hypertension). It may also reduce your risk for type 2 diabetes, heart disease, and stroke.  With the DASH  eating plan, you should limit salt (sodium) intake to 2,300 mg a day. If you have hypertension, you may need to reduce your sodium intake to 1,500 mg a day.  When on the DASH eating plan, aim to eat more fresh fruits and vegetables, whole grains, lean proteins, low-fat dairy, and heart-healthy fats.  Work with your health care provider or diet and nutrition specialist (dietitian) to adjust your eating plan to your  individual calorie needs. This information is not intended to replace advice given to you by your health care provider. Make sure you discuss any questions you have with your health care provider. Document Released: 08/13/2011 Document Revised: 08/17/2016 Document Reviewed: 08/17/2016 Elsevier Interactive Patient Education  2019 [employer].

## 2018-11-01 NOTE — Progress Notes (Signed)
Clinical Summary Mr. Crum is a 68 y.o.male seen today for follow up of the following medical problems.  1.Chronic Systolic Heart failure  - echo showed LVEF 40-45%, mod LVH, abnormal but ungraded diastolic function, reported akinesis of the basal anterolateral, mid-anterolateral, and apical lateral wall segments.  - lexiscan MPI showed moderate sized interoapical and inferolateral infarct with mild peri-infarct ischemia.  - repeat echo 04/2017 LVEF 56-21%, grade I diastolic dysfunction   - no recent SOB/DOE. No recent edema - compliant with meds   2Presumed CAD - based on echo and MPI results, MPI with suggestion of prior infarcts with only mild peri-infarct ischemia. This has been medically managed.  - 03/2017 Lexiscan with inferior/inferoapical defect with mild to moderate peri-infarct ischemia.   - no recent chest pain,.   3. AAA - followed by vascular - s/p open repair 06/2017   4. CKD III - followed by nephrology, baseline 1.4-1.6   5. Hyperlipidemia - tolerating lipirtor 80mg  every other day. Tolerating. Did not tolerate daily atorvastatin. - labs followed by pcp   6. HTN - compliant with meds - checks at home, typically 140s/80s.  - 11 lbs gain since last year.   SH: retired from [employer] 2 years ago. Grandkids x2 one is 14 and one is 5, he has 2 children. Daughter works as Marine scientist at [employer] History:  Diagnosis Date  . AAA (abdominal aortic aneurysm) ([place])   . Chronic kidney disease (CKD), stage III (moderate) (HCC)   . High cholesterol   . Unspecified essential hypertension      Allergies  Allergen Reactions  . Hydrochlorothiazide Other (See Comments)    Pt states "it made my legs hurt"  . Atorvastatin Other (See Comments)    Muscle pain  . Pravastatin Other (See Comments)    Muscle pain     Current Outpatient Medications  Medication Sig Dispense Refill  . amLODipine (NORVASC) 10 MG tablet Take 10  mg by mouth daily.  3  . aspirin EC 81 MG tablet Take 1 tablet (81 mg total) by mouth daily.    Marland Kitchen atorvastatin (LIPITOR) 80 MG tablet Take 80 mg by mouth daily. Takes 2 times a week    . carvedilol (COREG) 3.125 MG tablet Take 1 tablet (3.125 mg total) by mouth 2 (two) times daily. 60 tablet 6  . Cholecalciferol (VITAMIN D3) 5000 units CAPS Take 10,000 Units by mouth daily.    Marland Kitchen lisinopril (PRINIVIL,ZESTRIL) 40 MG tablet Take 1 tablet (40 mg total) by mouth daily. 30 tablet 6  . Multiple Vitamin (MULTIVITAMIN WITH MINERALS) TABS tablet Take 1 tablet by mouth daily.    Marland Kitchen NITROSTAT 0.4 MG SL tablet Place 1 tablet under the tongue every 5 (five) minutes x 3 doses as needed for chest pain.      No current facility-administered medications for this visit.      Past Surgical History:  Procedure Laterality Date  . ABDOMINAL AORTIC ANEURYSM REPAIR N/A 06/28/2017   Procedure: ANEURYSM ABDOMINAL AORTIC REPAIR OPEN;  Surgeon: Elam Dutch, MD;  Location: [facility] OR;  Service: Vascular;  Laterality: N/A;  . HAND SURGERY Left   . INCISIONAL HERNIA REPAIR     LEFT  . IRRIGATION AND DEBRIDEMENT SEBACEOUS CYST     FROM RIGHT SHOULDER  . UMBILICAL HERNIA REPAIR       Allergies  Allergen Reactions  . Hydrochlorothiazide Other (See Comments)    Pt states "it made my legs hurt"  .  Atorvastatin Other (See Comments)    Muscle pain  . Pravastatin Other (See Comments)    Muscle pain      Family History  Problem Relation Age of Onset  . Pneumonia Mother   . Other Father        brain tumor  . Diabetes Son      Social History Mr. Kistler reports that he quit smoking about 5 years ago. His smoking use included cigarettes. He started smoking about 53 years ago. He quit after 46.00 years of use. He has never used smokeless tobacco. Mr. Cothran reports no history of alcohol use.   Review of Systems CONSTITUTIONAL: No weight loss, fever, chills, weakness or fatigue.  HEENT: Eyes: No visual loss,  blurred vision, double vision or yellow sclerae.No hearing loss, sneezing, congestion, runny nose or sore throat.  SKIN: No rash or itching.  CARDIOVASCULAR: per hpi RESPIRATORY: No shortness of breath, cough or sputum.  GASTROINTESTINAL: No anorexia, nausea, vomiting or diarrhea. No abdominal pain or blood.  GENITOURINARY: No burning on urination, no polyuria NEUROLOGICAL: No headache, dizziness, syncope, paralysis, ataxia, numbness or tingling in the extremities. No change in bowel or bladder control.  MUSCULOSKELETAL: No muscle, back pain, joint pain or stiffness.  LYMPHATICS: No enlarged nodes. No history of splenectomy.  PSYCHIATRIC: No history of depression or anxiety.  ENDOCRINOLOGIC: No reports of sweating, cold or heat intolerance. No polyuria or polydipsia.  Marland Kitchen   Physical Examination Vitals:   11/01/18 0944  BP: 139/89  Pulse: (!) 58  SpO2: 96%   Vitals:   11/01/18 0944  Weight: 206 lb 3.2 oz (93.5 kg)  Height: 5\' 9"  (1.753 m)    Gen: resting comfortably, no acute distress HEENT: no scleral icterus, pupils equal round and reactive, no palptable cervical adenopathy,  CV: RRR, no m/r/g, no jvd Resp: Clear to auscultation bilaterally GI: abdomen is soft, non-tender, non-distended, normal bowel sounds, no hepatosplenomegaly MSK: extremities are warm, no edema.  Skin: warm, no rash Neuro:  no focal deficits Psych: appropriate affect   Diagnostic Studies 02/2014 MPI Raw images showed appropriate radiotracer uptake. There is a  moderate-sized mildly reversible inferoapical and inferolateral wall  defect. There are no other myocardial perfusion defects. There is  mild inferolateral wall hypokinesis.  Gated imaging shows end-diastolic volume 161 mL, and systolic volume  90 mL, left ventricular ejection fraction 40%.  IMPRESSION:  1. Abnormal Lexiscan MPI  2. Moderate sized mildl intenstiry inferoapcail and inferolateral  wall infarcts with mild peri-infarct  ischemia.  3. Decreased left ventricular systolic function, LVEF 09%  4. Increased study for major cardiac events due to decreased LV  systolic function, there is fairly mild myocardium at jeopardy   02/2014 Echo LVEF 40-45%, abnormal diastolic function. Multiple WMAs,  03/2017 MPI  Nuclear stress EF: 61%.  There was no ST segment deviation noted during stress.  Defect 1: There is a defect present in the basal inferior, basal inferolateral, mid inferior, mid inferolateral and apical inferior location.  Findings consistent with ischemia.  This is an intermediate risk study.  The left ventricular ejection fraction is normal (55-65%).  There is a large area, moderate severity defect in the basal and mid inferolateral, inferior and apical inferior walls (SDS=6) consistent with ischemia in the LCX territory.  04/2017 echo Study Conclusions  - Left ventricle: The cavity size was normal. Wall thickness was increased in a pattern of moderate LVH. Systolic function was normal. The estimated ejection fraction was in the range of 60%  to 65%. Wall motion was normal; there were no regional wall motion abnormalities. Doppler parameters are consistent with abnormal left ventricular relaxation (grade 1 diastolic dysfunction). - Aortic valve: Valve area (VTI): 3.52 cm^2. Valve area (Vmax): 3.11 cm^2. Valve area (Vmean): 3.12 cm^2. - Aorta: Aortic root dimension: 40 mm (ED). Ascending aortic diameter: 40 mm (S). - Aortic root: The aortic root was mildly dilated. - Left atrium: The atrium was moderately dilated. - Technically adequate study.    Assessment and Plan  1. Chronic systolic heart failure  - by most recent echo LVEF has normalized. - MPI shows evidence of possible old infarct, likely a component of ICM, though could be mixed with a HTN CM as well.   - no symptoms, continue current meds  2. Presumed CAD  -prior nuclear stress tests including a  recent test have shown evidence of prior infarct with peri-infarct ischemia - this has been managed medically in the absence of symptoms combined with his CKD  - no recent symptoms, continue current meds   3.Hyperlipidemia - continue every other day atorvastatin, did not tolerate daily dosing.  - we will request pcp labs  4. CKD III - request labs from pcp  5. HTN - manual recheck 140/80, above goal. Likely related to 11 lbs weight gain since last year Limited med options due to bradycardia, CKD. We will request most recent labs from pcp, pending renal function consider perhaps aldactone vs hydarlazine.    F/u 6 months    Arnoldo Lenis, M.D.,

## 2018-11-06 DIAGNOSIS — M6281 Muscle weakness (generalized): Secondary | ICD-10-CM | POA: Diagnosis not present

## 2018-11-06 DIAGNOSIS — J441 Chronic obstructive pulmonary disease with (acute) exacerbation: Secondary | ICD-10-CM | POA: Diagnosis not present

## 2018-11-08 ENCOUNTER — Telehealth: Payer: Self-pay | Admitting: *Deleted

## 2018-11-08 NOTE — Telephone Encounter (Signed)
-----   Message from Arnoldo Lenis, MD sent at 11/02/2018  4:33 PM EST ----- Labs received from his pcp, as we discussed in clinic yesterday his bp is above goal. Based on this and labs best option would be to start hydralazine 25mg  bid, please order   Zandra Abts MD

## 2018-11-08 NOTE — Telephone Encounter (Signed)
Patient informed and says he doesn't want to start any new medications at this time. Patient says he has started walking and is watching his diet to try and get his numbers down. Patient advised to continue monitoring his BP and bring readings with him to his next visit. This mornings BP readings was 123/81.

## 2018-11-08 NOTE — Telephone Encounter (Signed)
Nicolas Lenis, MD  [place], [place], CMA        Labs received from his pcp, as we discussed in clinic yesterday his bp is above goal. Based on this and labs best option would be to start hydralazine 25mg  bid, please order    Zandra Abts MD

## 2018-12-05 DIAGNOSIS — E782 Mixed hyperlipidemia: Secondary | ICD-10-CM | POA: Diagnosis not present

## 2018-12-05 DIAGNOSIS — I10 Essential (primary) hypertension: Secondary | ICD-10-CM | POA: Diagnosis not present

## 2018-12-05 DIAGNOSIS — N183 Chronic kidney disease, stage 3 (moderate): Secondary | ICD-10-CM | POA: Diagnosis not present

## 2018-12-05 DIAGNOSIS — J449 Chronic obstructive pulmonary disease, unspecified: Secondary | ICD-10-CM | POA: Diagnosis not present

## 2018-12-07 DIAGNOSIS — I480 Paroxysmal atrial fibrillation: Secondary | ICD-10-CM | POA: Diagnosis not present

## 2018-12-07 DIAGNOSIS — Z0000 Encounter for general adult medical examination without abnormal findings: Secondary | ICD-10-CM | POA: Diagnosis not present

## 2018-12-07 DIAGNOSIS — Z0001 Encounter for general adult medical examination with abnormal findings: Secondary | ICD-10-CM | POA: Diagnosis not present

## 2018-12-07 DIAGNOSIS — Z23 Encounter for immunization: Secondary | ICD-10-CM | POA: Diagnosis not present

## 2018-12-07 DIAGNOSIS — I10 Essential (primary) hypertension: Secondary | ICD-10-CM | POA: Diagnosis not present

## 2018-12-07 DIAGNOSIS — E782 Mixed hyperlipidemia: Secondary | ICD-10-CM | POA: Diagnosis not present

## 2018-12-07 DIAGNOSIS — N183 Chronic kidney disease, stage 3 (moderate): Secondary | ICD-10-CM | POA: Diagnosis not present

## 2018-12-07 DIAGNOSIS — J449 Chronic obstructive pulmonary disease, unspecified: Secondary | ICD-10-CM | POA: Diagnosis not present

## 2018-12-07 DIAGNOSIS — K219 Gastro-esophageal reflux disease without esophagitis: Secondary | ICD-10-CM | POA: Diagnosis not present

## 2018-12-07 DIAGNOSIS — J441 Chronic obstructive pulmonary disease with (acute) exacerbation: Secondary | ICD-10-CM | POA: Diagnosis not present

## 2018-12-07 DIAGNOSIS — M6281 Muscle weakness (generalized): Secondary | ICD-10-CM | POA: Diagnosis not present

## 2018-12-07 DIAGNOSIS — R142 Eructation: Secondary | ICD-10-CM | POA: Diagnosis not present

## 2019-01-06 DIAGNOSIS — J441 Chronic obstructive pulmonary disease with (acute) exacerbation: Secondary | ICD-10-CM | POA: Diagnosis not present

## 2019-01-06 DIAGNOSIS — M6281 Muscle weakness (generalized): Secondary | ICD-10-CM | POA: Diagnosis not present

## 2019-02-06 DIAGNOSIS — J441 Chronic obstructive pulmonary disease with (acute) exacerbation: Secondary | ICD-10-CM | POA: Diagnosis not present

## 2019-02-06 DIAGNOSIS — M6281 Muscle weakness (generalized): Secondary | ICD-10-CM | POA: Diagnosis not present

## 2019-03-07 DIAGNOSIS — E782 Mixed hyperlipidemia: Secondary | ICD-10-CM | POA: Diagnosis not present

## 2019-03-07 DIAGNOSIS — I10 Essential (primary) hypertension: Secondary | ICD-10-CM | POA: Diagnosis not present

## 2019-03-08 DIAGNOSIS — J441 Chronic obstructive pulmonary disease with (acute) exacerbation: Secondary | ICD-10-CM | POA: Diagnosis not present

## 2019-03-08 DIAGNOSIS — M6281 Muscle weakness (generalized): Secondary | ICD-10-CM | POA: Diagnosis not present

## 2019-04-07 DIAGNOSIS — E7800 Pure hypercholesterolemia, unspecified: Secondary | ICD-10-CM | POA: Diagnosis not present

## 2019-04-07 DIAGNOSIS — J449 Chronic obstructive pulmonary disease, unspecified: Secondary | ICD-10-CM | POA: Diagnosis not present

## 2019-04-07 DIAGNOSIS — I10 Essential (primary) hypertension: Secondary | ICD-10-CM | POA: Diagnosis not present

## 2019-04-27 ENCOUNTER — Encounter: Payer: Self-pay | Admitting: Cardiology

## 2019-04-27 DIAGNOSIS — I10 Essential (primary) hypertension: Secondary | ICD-10-CM | POA: Diagnosis not present

## 2019-04-27 DIAGNOSIS — E782 Mixed hyperlipidemia: Secondary | ICD-10-CM | POA: Diagnosis not present

## 2019-04-27 DIAGNOSIS — N183 Chronic kidney disease, stage 3 (moderate): Secondary | ICD-10-CM | POA: Diagnosis not present

## 2019-04-27 DIAGNOSIS — J449 Chronic obstructive pulmonary disease, unspecified: Secondary | ICD-10-CM | POA: Diagnosis not present

## 2019-05-03 DIAGNOSIS — I10 Essential (primary) hypertension: Secondary | ICD-10-CM | POA: Diagnosis not present

## 2019-05-03 DIAGNOSIS — Z6828 Body mass index (BMI) 28.0-28.9, adult: Secondary | ICD-10-CM | POA: Diagnosis not present

## 2019-05-03 DIAGNOSIS — E782 Mixed hyperlipidemia: Secondary | ICD-10-CM | POA: Diagnosis not present

## 2019-05-03 DIAGNOSIS — J449 Chronic obstructive pulmonary disease, unspecified: Secondary | ICD-10-CM | POA: Diagnosis not present

## 2019-05-03 DIAGNOSIS — N183 Chronic kidney disease, stage 3 (moderate): Secondary | ICD-10-CM | POA: Diagnosis not present

## 2019-05-08 ENCOUNTER — Ambulatory Visit (INDEPENDENT_AMBULATORY_CARE_PROVIDER_SITE_OTHER): Payer: PPO | Admitting: Cardiology

## 2019-05-08 ENCOUNTER — Other Ambulatory Visit: Payer: Self-pay

## 2019-05-08 ENCOUNTER — Encounter: Payer: Self-pay | Admitting: Cardiology

## 2019-05-08 VITALS — BP 125/70 | HR 58 | Ht 69.0 in | Wt 194.8 lb

## 2019-05-08 DIAGNOSIS — I2510 Atherosclerotic heart disease of native coronary artery without angina pectoris: Secondary | ICD-10-CM | POA: Diagnosis not present

## 2019-05-08 DIAGNOSIS — E782 Mixed hyperlipidemia: Secondary | ICD-10-CM

## 2019-05-08 DIAGNOSIS — I10 Essential (primary) hypertension: Secondary | ICD-10-CM

## 2019-05-08 DIAGNOSIS — I5022 Chronic systolic (congestive) heart failure: Secondary | ICD-10-CM

## 2019-05-08 NOTE — Patient Instructions (Signed)

## 2019-05-08 NOTE — Progress Notes (Signed)
Clinical Summary Mr. Fick is a 68 y.o.male seen today for follow up of the following medical problems.  1.Chronic Systolic Heart failure  - echo showed LVEF 40-45%, mod LVH, abnormal but ungraded diastolic function, reported akinesis of the basal anterolateral, mid-anterolateral, and apical lateral wall segments.  - lexiscan MPI showed moderate sized interoapical and inferolateral infarct with mild peri-infarct ischemia.  - repeat echo 04/2017 LVEF 28-41%, grade I diastolic dysfunction    - walking regularly, not SOB or DOE - no recent edema     2Presumed CAD - based on echo and MPI results, MPI with suggestion of prior infarcts with only mild peri-infarct ischemia. This has been medically managed.  - 03/2017 Lexiscan with inferior/inferoapical defect with mild to moderate peri-infarct ischemia.   No recent chest pain.   3. AAA - followed by vascular - s/p open repair 06/2017  - vascular wanted a repeat CT in 08/2022   4. CKD III - followed by nephrology, baseline 1.4-1.6 - he reports last labs by pcp Cr down to 1.3   5. Hyperlipidemia - tolerating lipirtor 80mg  every other day. - he reports recent labs with pcp  6. HTN - compliant with meds  SH: retired from [employer] 2 years ago. Grandkids x2 one is 14 and one is 5, he has 2 children. Daughter works as Marine scientist at [employer] History:  Diagnosis Date  . AAA (abdominal aortic aneurysm) ([place])   . Chronic kidney disease (CKD), stage III (moderate) (HCC)   . High cholesterol   . Unspecified essential hypertension      Allergies  Allergen Reactions  . Hydrochlorothiazide Other (See Comments)    Pt states "it made my legs hurt"  . Pravastatin Other (See Comments)    Muscle pain     Current Outpatient Medications  Medication Sig Dispense Refill  . amLODipine (NORVASC) 10 MG tablet Take 10 mg by mouth daily.  3  . aspirin EC 81 MG tablet Take 1 tablet (81 mg  total) by mouth daily.    Marland Kitchen atorvastatin (LIPITOR) 80 MG tablet Take 80 mg by mouth every other day.     . carvedilol (COREG) 3.125 MG tablet Take 1 tablet (3.125 mg total) by mouth 2 (two) times daily. 60 tablet 6  . lisinopril (PRINIVIL,ZESTRIL) 40 MG tablet Take 1 tablet (40 mg total) by mouth daily. 30 tablet 6  . Multiple Vitamin (MULTIVITAMIN WITH MINERALS) TABS tablet Take 1 tablet by mouth daily.    Marland Kitchen NITROSTAT 0.4 MG SL tablet Place 1 tablet under the tongue every 5 (five) minutes x 3 doses as needed for chest pain.     . Cholecalciferol (VITAMIN D3) 5000 units CAPS Take 10,000 Units by mouth daily.     No current facility-administered medications for this visit.      Past Surgical History:  Procedure Laterality Date  . ABDOMINAL AORTIC ANEURYSM REPAIR N/A 06/28/2017   Procedure: ANEURYSM ABDOMINAL AORTIC REPAIR OPEN;  Surgeon: Elam Dutch, MD;  Location: [facility] OR;  Service: Vascular;  Laterality: N/A;  . HAND SURGERY Left   . INCISIONAL HERNIA REPAIR     LEFT  . IRRIGATION AND DEBRIDEMENT SEBACEOUS CYST     FROM RIGHT SHOULDER  . UMBILICAL HERNIA REPAIR       Allergies  Allergen Reactions  . Hydrochlorothiazide Other (See Comments)    Pt states "it made my legs hurt"  . Pravastatin Other (See Comments)    Muscle  pain      Family History  Problem Relation Age of Onset  . Pneumonia Mother   . Other Father        brain tumor  . Diabetes Son      Social History Mr. Breaker reports that he quit smoking about 6 years ago. His smoking use included cigarettes. He started smoking about 54 years ago. He quit after 46.00 years of use. He has never used smokeless tobacco. Mr. Dittmar reports no history of alcohol use.   Review of Systems CONSTITUTIONAL: No weight loss, fever, chills, weakness or fatigue.  HEENT: Eyes: No visual loss, blurred vision, double vision or yellow sclerae.No hearing loss, sneezing, congestion, runny nose or sore throat.  SKIN: No rash or  itching.  CARDIOVASCULAR: per hpi RESPIRATORY: No shortness of breath, cough or sputum.  GASTROINTESTINAL: No anorexia, nausea, vomiting or diarrhea. No abdominal pain or blood.  GENITOURINARY: No burning on urination, no polyuria NEUROLOGICAL: No headache, dizziness, syncope, paralysis, ataxia, numbness or tingling in the extremities. No change in bowel or bladder control.  MUSCULOSKELETAL: No muscle, back pain, joint pain or stiffness.  LYMPHATICS: No enlarged nodes. No history of splenectomy.  PSYCHIATRIC: No history of depression or anxiety.  ENDOCRINOLOGIC: No reports of sweating, cold or heat intolerance. No polyuria or polydipsia.  Marland Kitchen   Physical Examination Vitals:   05/08/19 1443  BP: 125/70  Pulse: (!) 58  SpO2: 96%   Filed Weights   05/08/19 1443  Weight: 194 lb 12.8 oz (88.4 kg)    Gen: resting comfortably, no acute distress HEENT: no scleral icterus, pupils equal round and reactive, no palptable cervical adenopathy,  CV: RRR, no mr/g, no jvd Resp: Clear to auscultation bilaterally GI: abdomen is soft, non-tender, non-distended, normal bowel sounds, no hepatosplenomegaly MSK: extremities are warm, no edema.  Skin: warm, no rash Neuro:  no focal deficits Psych: appropriate affect   Diagnostic Studies 02/2014 MPI Raw images showed appropriate radiotracer uptake. There is a  moderate-sized mildly reversible inferoapical and inferolateral wall  defect. There are no other myocardial perfusion defects. There is  mild inferolateral wall hypokinesis.  Gated imaging shows end-diastolic volume 662 mL, and systolic volume  90 mL, left ventricular ejection fraction 40%.  IMPRESSION:  1. Abnormal Lexiscan MPI  2. 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  3. Decreased left ventricular systolic function, LVEF 94%  4. Increased study for major cardiac events due to decreased LV  systolic  function, there is fairly mild myocardium at jeopardy   02/2014 Echo LVEF 40-45%, abnormal diastolic function. Multiple WMAs,  03/2017 MPI  Nuclear stress EF: 61%.  There was no ST segment deviation noted during stress.  Defect 1: There is a defect present in the basal inferior, basal inferolateral, mid inferior, mid inferolateral and apical inferior location.  Findings consistent with ischemia.  This is an intermediate risk study.  The left ventricular ejection fraction is normal (55-65%).  There is a large area, moderate severity defect in the basal and mid inferolateral, inferior and apical inferior walls (SDS=6) consistent with ischemia in the LCX territory.  04/2017 echo Study Conclusions  - Left ventricle: The cavity size was normal. Wall thickness was increased in a pattern of moderate LVH. Systolic function was normal. The estimated ejection fraction was in the range of 60% to 65%. Wall motion was normal; there were no regional wall motion abnormalities. Doppler parameters are consistent with abnormal left ventricular relaxation (grade 1  diastolic dysfunction). - Aortic valve: Valve area (VTI): 3.52 cm^2. Valve area (Vmax): 3.11 cm^2. Valve area (Vmean): 3.12 cm^2. - Aorta: Aortic root dimension: 40 mm (ED). Ascending aortic diameter: 40 mm (S). - Aortic root: The aortic root was mildly dilated. - Left atrium: The atrium was moderately dilated. - Technically adequate study.    Assessment and Plan  1. Chronic systolic heart failure  - by most recent echo LVEF has normalized. - MPI shows evidence of possible old infarct, likely a component of ICM, though could be mixed with a HTN CM as well.   - no recent symptoms, continue current meds   2. Presumed CAD  -prior nuclear stress tests including a recent test have shown evidence of prior infarct with peri-infarct ischemia - this has been managed medically in the absence of symptoms  combined with his CKD  - no symptoms, continue current meds  3.Hyperlipidemia -continue every other day atorvastatin, did not tolerate daily dosing.  - request pcp labs    4. HTN -at goal, had been elevated last visit during 10 lbs weight gain. He has lost the weight and bp has come back down - continue current meds      Arnoldo Lenis, M.D.

## 2019-05-09 ENCOUNTER — Encounter: Payer: Self-pay | Admitting: *Deleted

## 2019-06-07 DIAGNOSIS — I10 Essential (primary) hypertension: Secondary | ICD-10-CM | POA: Diagnosis not present

## 2019-06-07 DIAGNOSIS — E782 Mixed hyperlipidemia: Secondary | ICD-10-CM | POA: Diagnosis not present

## 2019-08-07 DIAGNOSIS — I10 Essential (primary) hypertension: Secondary | ICD-10-CM | POA: Diagnosis not present

## 2019-08-07 DIAGNOSIS — E782 Mixed hyperlipidemia: Secondary | ICD-10-CM | POA: Diagnosis not present

## 2019-09-07 DIAGNOSIS — I10 Essential (primary) hypertension: Secondary | ICD-10-CM | POA: Diagnosis not present

## 2019-09-07 DIAGNOSIS — E782 Mixed hyperlipidemia: Secondary | ICD-10-CM | POA: Diagnosis not present

## 2019-10-06 DIAGNOSIS — I10 Essential (primary) hypertension: Secondary | ICD-10-CM | POA: Diagnosis not present

## 2019-10-06 DIAGNOSIS — E7849 Other hyperlipidemia: Secondary | ICD-10-CM | POA: Diagnosis not present

## 2019-10-25 DIAGNOSIS — J449 Chronic obstructive pulmonary disease, unspecified: Secondary | ICD-10-CM | POA: Diagnosis not present

## 2019-10-25 DIAGNOSIS — E782 Mixed hyperlipidemia: Secondary | ICD-10-CM | POA: Diagnosis not present

## 2019-10-25 DIAGNOSIS — Z6830 Body mass index (BMI) 30.0-30.9, adult: Secondary | ICD-10-CM | POA: Diagnosis not present

## 2019-10-25 DIAGNOSIS — N183 Chronic kidney disease, stage 3 unspecified: Secondary | ICD-10-CM | POA: Diagnosis not present

## 2019-10-25 DIAGNOSIS — Z6828 Body mass index (BMI) 28.0-28.9, adult: Secondary | ICD-10-CM | POA: Diagnosis not present

## 2019-10-25 DIAGNOSIS — I10 Essential (primary) hypertension: Secondary | ICD-10-CM | POA: Diagnosis not present

## 2019-11-03 DIAGNOSIS — E7849 Other hyperlipidemia: Secondary | ICD-10-CM | POA: Diagnosis not present

## 2019-11-03 DIAGNOSIS — I129 Hypertensive chronic kidney disease with stage 1 through stage 4 chronic kidney disease, or unspecified chronic kidney disease: Secondary | ICD-10-CM | POA: Diagnosis not present

## 2019-11-03 DIAGNOSIS — J449 Chronic obstructive pulmonary disease, unspecified: Secondary | ICD-10-CM | POA: Diagnosis not present

## 2019-12-05 ENCOUNTER — Telehealth: Payer: Self-pay | Admitting: Cardiology

## 2019-12-05 NOTE — Telephone Encounter (Signed)
Called patient to schedule follow up and patient declined stated that he will not be coming back

## 2019-12-06 DIAGNOSIS — I10 Essential (primary) hypertension: Secondary | ICD-10-CM | POA: Diagnosis not present

## 2019-12-06 DIAGNOSIS — E7849 Other hyperlipidemia: Secondary | ICD-10-CM | POA: Diagnosis not present

## 2020-01-05 DIAGNOSIS — E7849 Other hyperlipidemia: Secondary | ICD-10-CM | POA: Diagnosis not present

## 2020-01-05 DIAGNOSIS — I10 Essential (primary) hypertension: Secondary | ICD-10-CM | POA: Diagnosis not present

## 2020-01-05 DIAGNOSIS — J449 Chronic obstructive pulmonary disease, unspecified: Secondary | ICD-10-CM | POA: Diagnosis not present

## 2020-03-06 DIAGNOSIS — I129 Hypertensive chronic kidney disease with stage 1 through stage 4 chronic kidney disease, or unspecified chronic kidney disease: Secondary | ICD-10-CM | POA: Diagnosis not present

## 2020-03-06 DIAGNOSIS — E7849 Other hyperlipidemia: Secondary | ICD-10-CM | POA: Diagnosis not present

## 2020-03-06 DIAGNOSIS — N183 Chronic kidney disease, stage 3 unspecified: Secondary | ICD-10-CM | POA: Diagnosis not present

## 2020-04-22 DIAGNOSIS — Z1331 Encounter for screening for depression: Secondary | ICD-10-CM | POA: Diagnosis not present

## 2020-04-22 DIAGNOSIS — Z6829 Body mass index (BMI) 29.0-29.9, adult: Secondary | ICD-10-CM | POA: Diagnosis not present

## 2020-04-22 DIAGNOSIS — E782 Mixed hyperlipidemia: Secondary | ICD-10-CM | POA: Diagnosis not present

## 2020-04-22 DIAGNOSIS — Z1389 Encounter for screening for other disorder: Secondary | ICD-10-CM | POA: Diagnosis not present

## 2020-04-22 DIAGNOSIS — I10 Essential (primary) hypertension: Secondary | ICD-10-CM | POA: Diagnosis not present

## 2020-04-22 DIAGNOSIS — J449 Chronic obstructive pulmonary disease, unspecified: Secondary | ICD-10-CM | POA: Diagnosis not present

## 2020-04-22 DIAGNOSIS — Z0001 Encounter for general adult medical examination with abnormal findings: Secondary | ICD-10-CM | POA: Diagnosis not present

## 2020-04-22 DIAGNOSIS — N183 Chronic kidney disease, stage 3 unspecified: Secondary | ICD-10-CM | POA: Diagnosis not present

## 2020-05-24 DIAGNOSIS — J432 Centrilobular emphysema: Secondary | ICD-10-CM | POA: Diagnosis not present

## 2020-05-24 DIAGNOSIS — I2510 Atherosclerotic heart disease of native coronary artery without angina pectoris: Secondary | ICD-10-CM | POA: Diagnosis not present

## 2020-05-24 DIAGNOSIS — I700 Atherosclerosis of aorta: Secondary | ICD-10-CM | POA: Diagnosis not present

## 2020-05-24 DIAGNOSIS — I77810 Thoracic aortic ectasia: Secondary | ICD-10-CM | POA: Diagnosis not present

## 2020-05-24 DIAGNOSIS — Z87891 Personal history of nicotine dependence: Secondary | ICD-10-CM | POA: Diagnosis not present

## 2020-06-06 DIAGNOSIS — I129 Hypertensive chronic kidney disease with stage 1 through stage 4 chronic kidney disease, or unspecified chronic kidney disease: Secondary | ICD-10-CM | POA: Diagnosis not present

## 2020-06-06 DIAGNOSIS — N183 Chronic kidney disease, stage 3 unspecified: Secondary | ICD-10-CM | POA: Diagnosis not present

## 2020-06-06 DIAGNOSIS — E7849 Other hyperlipidemia: Secondary | ICD-10-CM | POA: Diagnosis not present

## 2020-06-18 ENCOUNTER — Other Ambulatory Visit: Payer: Self-pay

## 2020-06-18 ENCOUNTER — Encounter: Payer: Self-pay | Admitting: Internal Medicine

## 2020-06-18 ENCOUNTER — Ambulatory Visit: Payer: PPO | Admitting: Internal Medicine

## 2020-06-18 VITALS — BP 134/70 | HR 61 | Temp 98.8°F | Ht 69.0 in | Wt 200.2 lb

## 2020-06-18 DIAGNOSIS — R918 Other nonspecific abnormal finding of lung field: Secondary | ICD-10-CM

## 2020-06-18 NOTE — Progress Notes (Signed)
Nicolas Butler    [id number]    30-Apr-1951  Primary Care Physician:Boyd, Grace Bushy, PA  Referring Physician: Lavella Lemons, PA [place],  [place] [address] Reason for Consultation: abnormal ct scan Date of Consultation: 06/18/2020  Chief complaint:   Chief Complaint  Patient presents with  . Consult    No complaints currently     HPI: [name] is a 69 year old gentleman past medical history of tobacco use quit in 2014.  He had a low-dose CT scan for lung cancer screening in September of this year.  Took an abnormal result and that is why he is here today.  He denies any difficulty with breathing although has had some in the past.  Had open AA repair and had respiratory complications in 1751. Had umbilical hernia repair and had lots of cough and dyspnea after this. Was prescribed nebulizer solution at this time.  He no longer takes any of his.  He lives independently and has no issues with ADLs. He walks 1-2 miles/day.  He is here with his daughter today who works in the Research officer, political party at [employer].  He has daily chronic cough that helps this.  He denies any fevers chills or weight loss.  Social history:  Occupation: Retired, worked in a Education administrator.  Exposures: lives at home independently Smoking history: Smoker quit in 2014.  Social History   Occupational History  . Not on file  Tobacco Use  . Smoking status: Former Smoker    Packs/day: 1.50    Years: 46.00    Pack years: 69.00    Types: Cigarettes    Start date: 05/17/1965    Quit date: 12/07/2012    Years since quitting: 7.5  . Smokeless tobacco: Never Used  Substance and Sexual Activity  . Alcohol use: No    Alcohol/week: 0.0 standard drinks  . Drug use: No  . Sexual activity: Not on file    Relevant family history:  Family History  Problem Relation Age of Onset  . Pneumonia Mother   . Other Father        brain tumor  . Diabetes Son   . Lung cancer Neg Hx     Past Medical  History:  Diagnosis Date  . AAA (abdominal aortic aneurysm) ([place])   . Chronic kidney disease (CKD), stage III (moderate) (HCC)   . High cholesterol   . Unspecified essential hypertension     Past Surgical History:  Procedure Laterality Date  . ABDOMINAL AORTIC ANEURYSM REPAIR N/A 06/28/2017   Procedure: ANEURYSM ABDOMINAL AORTIC REPAIR OPEN;  Surgeon: Elam Dutch, MD;  Location: [facility] OR;  Service: Vascular;  Laterality: N/A;  . HAND SURGERY Left   . INCISIONAL HERNIA REPAIR     LEFT  . IRRIGATION AND DEBRIDEMENT SEBACEOUS CYST     FROM RIGHT SHOULDER  . UMBILICAL HERNIA REPAIR       Physical Exam: Blood pressure 134/70, pulse 61, temperature 98.8 F (37.1 C), temperature source Oral, height 5\' 9"  (1.753 m), weight 200 lb 3.2 oz (90.8 kg), SpO2 95 %. Gen:      No acute distress ENT:  no nasal polyps, mucus membranes moist Lungs:    Diminished, No increased respiratory effort, symmetric chest wall excursion, clear to auscultation bilaterally, no wheezes or crackles CV:         Regular rate and rhythm; no murmurs, rubs, or gallops.  No pedal edema Abd:      +  bowel sounds; soft, non-tender; no distension MSK: no acute synovitis of DIP or PIP joints, no mechanics hands.  Skin:      Warm and dry; no rashes Neuro: normal speech, no focal facial asymmetry Psych: alert and oriented x3, normal mood and affect   Data Reviewed/Medical Decision Making:  Independent interpretation of tests: Imaging: . Review of patient's chest xray 06/2017 images revealed no acute process. The patient's images have been independently reviewed by me.  I have reviewed the report from CT scan done   PFTs: None on file  Labs:  Lab Results  Component Value Date   WBC 9.8 07/03/2017   HGB 10.9 (L) 07/03/2017   HCT 31.8 (L) 07/03/2017   MCV 95.2 07/03/2017   PLT 176 07/03/2017   Lab Results  Component Value Date   NA 133 (L) 07/03/2017   K 4.4 07/03/2017   CL 103 07/03/2017   CO2 25  07/03/2017     Immunization status:  Immunization History  Administered Date(s) Administered  . Tdap 01/21/2016    . I reviewed prior external note(s) from radiology, primary care . I reviewed the result(s) of the labs and imaging as noted above.  . I have ordered PET scan  Discussion of management or test interpretation with another colleague.   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  I will schedule this the week of 10/25. PET scheduled for 10/21.  He is agreeable to this. We reviewed the risks and benefits of procedure and he has decided to proceed.   Will obtain a full set of PFTs as well.   We discussed disease management and progression at length today.   I spent 76 minutes in the care of this patient today including pre-charting, chart review, review of results, face-to-face care, coordination of care and communication with consultants etc.).  Return to Care: Return in about 4 weeks (around 07/16/2020).  Lenice Llamas, MD Pulmonary and [place]  CC: Lavella Lemons, [place]

## 2020-06-18 NOTE — Patient Instructions (Signed)
The patient should have follow up scheduled with myself in 1 months.    Prior to next visit patient should have: Full set of PFTs   PET scan asap.

## 2020-06-18 NOTE — H&P (View-Only) (Signed)
SHONTA PHILLIS    [id number]    December 27, 1950  Primary Care Physician:Boyd, Grace Bushy, PA  Referring Physician: Lavella Lemons, PA [place],  [place] [address] Reason for Consultation: abnormal ct scan Date of Consultation: 06/18/2020  Chief complaint:   Chief Complaint  Patient presents with  . Consult    No complaints currently     HPI: Mr. Quay Burow is a 69 year old gentleman past medical history of tobacco use quit in 2014.  He had a low-dose CT scan for lung cancer screening in September of this year.  Took an abnormal result and that is why he is here today.  He denies any difficulty with breathing although has had some in the past.  Had open AA repair and had respiratory complications in 0867. Had umbilical hernia repair and had lots of cough and dyspnea after this. Was prescribed nebulizer solution at this time.  He no longer takes any of his.  He lives independently and has no issues with ADLs. He walks 1-2 miles/day.  He is here with his daughter today who works in the Research officer, political party at [employer].  He has daily chronic cough that helps this.  He denies any fevers chills or weight loss.  Social history:  Occupation: Retired, worked in a Education administrator.  Exposures: lives at home independently Smoking history: Smoker quit in 2014.  Social History   Occupational History  . Not on file  Tobacco Use  . Smoking status: Former Smoker    Packs/day: 1.50    Years: 46.00    Pack years: 69.00    Types: Cigarettes    Start date: 05/17/1965    Quit date: 12/07/2012    Years since quitting: 7.5  . Smokeless tobacco: Never Used  Substance and Sexual Activity  . Alcohol use: No    Alcohol/week: 0.0 standard drinks  . Drug use: No  . Sexual activity: Not on file    Relevant family history:  Family History  Problem Relation Age of Onset  . Pneumonia Mother   . Other Father        brain tumor  . Diabetes Son   . Lung cancer Neg Hx     Past Medical  History:  Diagnosis Date  . AAA (abdominal aortic aneurysm) ([place])   . Chronic kidney disease (CKD), stage III (moderate) ([facility])   . High cholesterol   . Unspecified essential hypertension     Past Surgical History:  Procedure Laterality Date  . ABDOMINAL AORTIC ANEURYSM REPAIR N/A 06/28/2017   Procedure: ANEURYSM ABDOMINAL AORTIC REPAIR OPEN;  Surgeon: Elam Dutch, MD;  Location: [facility] OR;  Service: Vascular;  Laterality: N/A;  . HAND SURGERY Left   . INCISIONAL HERNIA REPAIR     LEFT  . IRRIGATION AND DEBRIDEMENT SEBACEOUS CYST     FROM RIGHT SHOULDER  . UMBILICAL HERNIA REPAIR       Physical Exam: Blood pressure 134/70, pulse 61, temperature 98.8 F (37.1 C), temperature source Oral, height 5\' 9"  (1.753 m), weight 200 lb 3.2 oz (90.8 kg), SpO2 95 %. Gen:      No acute distress ENT:  no nasal polyps, mucus membranes moist Lungs:    Diminished, No increased respiratory effort, symmetric chest wall excursion, clear to auscultation bilaterally, no wheezes or crackles CV:         Regular rate and rhythm; no murmurs, rubs, or gallops.  No pedal edema Abd:      +  bowel sounds; soft, non-tender; no distension MSK: no acute synovitis of DIP or PIP joints, no mechanics hands.  Skin:      Warm and dry; no rashes Neuro: normal speech, no focal facial asymmetry Psych: alert and oriented x3, normal mood and affect   Data Reviewed/Medical Decision Making:  Independent interpretation of tests: Imaging: . Review of patient's chest xray 06/2017 images revealed no acute process. The patient's images have been independently reviewed by me.  I have reviewed the report from CT scan done   PFTs: None on file  Labs:  Lab Results  Component Value Date   WBC 9.8 07/03/2017   HGB 10.9 (L) 07/03/2017   HCT 31.8 (L) 07/03/2017   MCV 95.2 07/03/2017   PLT 176 07/03/2017   Lab Results  Component Value Date   NA 133 (L) 07/03/2017   K 4.4 07/03/2017   CL 103 07/03/2017   CO2 25  07/03/2017     Immunization status:  Immunization History  Administered Date(s) Administered  . Tdap 01/21/2016    . I reviewed prior external note(s) from radiology, primary care . I reviewed the result(s) of the labs and imaging as noted above.  . I have ordered PET scan  Discussion of management or test interpretation with another colleague.   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  I will schedule this the week of 10/25. PET scheduled for 10/21.  He is agreeable to this. We reviewed the risks and benefits of procedure and he has decided to proceed.   Will obtain a full set of PFTs as well.   We discussed disease management and progression at length today.   I spent 76 minutes in the care of this patient today including pre-charting, chart review, review of results, face-to-face care, coordination of care and communication with consultants etc.).  Return to Care: Return in about 4 weeks (around 07/16/2020).  Lenice Llamas, MD Pulmonary and [place]  CC: Lavella Lemons, [place]

## 2020-06-24 ENCOUNTER — Telehealth: Payer: Self-pay | Admitting: *Deleted

## 2020-06-24 DIAGNOSIS — R918 Other nonspecific abnormal finding of lung field: Secondary | ICD-10-CM

## 2020-06-24 DIAGNOSIS — Z5181 Encounter for therapeutic drug level monitoring: Secondary | ICD-10-CM

## 2020-06-24 NOTE — Telephone Encounter (Signed)
-----   Message from Ilona Sorrel sent at 06/18/2020  5:11 PM EDT ----- Regarding: bronch Dr Shearon Stalls put in this order -   Order for Nicolas Butler, Nicolas Butler [[id number]] Order #: [id number] Procedure: BRONCHOSCOPY Order Date: 06/18/2020 Proc Category: Procedure/minor Surgical Orderables Priority: Routine Class: Clinic Performed Standing Status: Future Expires: 06/18/2021 Status:  Ordering User: Spero Geralds, MD Department: [place] Provider: Ernie Avena Provider: Spero Geralds, MD Diagnosis: Mass of upper lobe of right lung   Sched Instruct:  Please schedule the following:  Diagnosis: Endobronchial Lung Mass Procedure: Bronchoscopy with Biopsies Anesthesia: Yes Do you need Fluro? No Priority: urgent Date: 10/26 PM Alternate Date: 10/25 PM  Time: PM Location: [place] Does patient have OSA? no DM? no Or Latex allergy? no Medication Restriction: none Anticoagulate/Antiplatelet: on ASA, ok to continue.  Pre-op Labs Ordered: CBC, CMP, PT/INR, PTT Imaging request: Pet scan ordered  (If, SuperDimension CT Chest, please have STAT courier sent to [facility] Pulmonary Office [address].)  Please coordinate Pre-op COVID Testing Visit Types: [place]

## 2020-06-25 ENCOUNTER — Encounter (HOSPITAL_COMMUNITY): Payer: Self-pay | Admitting: Internal Medicine

## 2020-06-25 ENCOUNTER — Other Ambulatory Visit: Payer: Self-pay

## 2020-06-25 NOTE — Telephone Encounter (Signed)
Called and spoke with patient. Let them know their Bronch is scheduled for 07/02/20 with Dr. Shearon Stalls at 1pm.  Patient was instructed to arrive at hospital at 1145. They were instructed to bring someone with them as they will not be able to drive home from procedure. Patient instructed not to have anything to eat or drink after midnight. .   Patient's covid screening is scheduled at [facility] location for 06/29/20 at 1100.  Patient voiced understanding, nothing further needed  Routing to [place] as FYI

## 2020-06-27 ENCOUNTER — Encounter (HOSPITAL_COMMUNITY)
Admission: RE | Admit: 2020-06-27 | Discharge: 2020-06-27 | Disposition: A | Discharge status: Home / Self Care | Payer: PPO | Source: Ambulatory Visit | Attending: Internal Medicine | Admitting: Internal Medicine

## 2020-06-27 ENCOUNTER — Ambulatory Visit (HOSPITAL_COMMUNITY)
Admission: RE | Admit: 2020-06-27 | Disposition: A | Discharge status: Home / Self Care | Payer: PPO | Source: Home / Self Care | Attending: Internal Medicine | Admitting: Internal Medicine

## 2020-06-27 DIAGNOSIS — E278 Other specified disorders of adrenal gland: Secondary | ICD-10-CM | POA: Diagnosis not present

## 2020-06-27 DIAGNOSIS — I700 Atherosclerosis of aorta: Secondary | ICD-10-CM | POA: Insufficient documentation

## 2020-06-27 DIAGNOSIS — R918 Other nonspecific abnormal finding of lung field: Secondary | ICD-10-CM | POA: Insufficient documentation

## 2020-06-27 DIAGNOSIS — D3501 Benign neoplasm of right adrenal gland: Secondary | ICD-10-CM | POA: Diagnosis not present

## 2020-06-27 DIAGNOSIS — J439 Emphysema, unspecified: Secondary | ICD-10-CM | POA: Diagnosis not present

## 2020-06-27 DIAGNOSIS — D3500 Benign neoplasm of unspecified adrenal gland: Secondary | ICD-10-CM | POA: Diagnosis not present

## 2020-06-27 DIAGNOSIS — I2510 Atherosclerotic heart disease of native coronary artery without angina pectoris: Secondary | ICD-10-CM | POA: Insufficient documentation

## 2020-06-27 HISTORY — DX: Other complications of anesthesia, initial encounter: T88.59XA

## 2020-06-27 LAB — GLUCOSE, CAPILLARY: Glucose-Capillary: 81 mg/dL (ref 70–99)

## 2020-06-27 MED ORDER — FLUDEOXYGLUCOSE F - 18 (FDG) INJECTION
9.3700 | Freq: Once | INTRAVENOUS | Status: AC
  Administered 2020-06-27: 9.37 via INTRAVENOUS

## 2020-06-29 ENCOUNTER — Other Ambulatory Visit (HOSPITAL_COMMUNITY)
Admission: RE | Admit: 2020-06-29 | Discharge: 2020-06-29 | Disposition: A | Discharge status: Home / Self Care | Payer: PPO | Source: Ambulatory Visit | Attending: Internal Medicine | Admitting: Internal Medicine

## 2020-06-29 DIAGNOSIS — Z20822 Contact with and (suspected) exposure to covid-19: Secondary | ICD-10-CM | POA: Insufficient documentation

## 2020-06-29 DIAGNOSIS — Z01812 Encounter for preprocedural laboratory examination: Secondary | ICD-10-CM | POA: Insufficient documentation

## 2020-06-29 LAB — SARS CORONAVIRUS 2 (TAT 6-24 HRS): SARS Coronavirus 2: NEGATIVE

## 2020-07-01 NOTE — Progress Notes (Signed)
Attempted preop call for endo procedure tomorrow 07/02/20- no answer- automated VM.

## 2020-07-02 ENCOUNTER — Other Ambulatory Visit: Payer: Self-pay

## 2020-07-02 ENCOUNTER — Ambulatory Visit (HOSPITAL_COMMUNITY): Payer: PPO | Admitting: Certified Registered Nurse Anesthetist

## 2020-07-02 ENCOUNTER — Encounter (HOSPITAL_COMMUNITY): Payer: Self-pay | Admitting: Internal Medicine

## 2020-07-02 ENCOUNTER — Encounter (HOSPITAL_COMMUNITY)
Admission: RE | Disposition: A | Discharge status: Home / Self Care | Payer: Self-pay | Source: Home / Self Care | Attending: Internal Medicine

## 2020-07-02 ENCOUNTER — Ambulatory Visit (HOSPITAL_COMMUNITY): Payer: PPO

## 2020-07-02 ENCOUNTER — Ambulatory Visit (HOSPITAL_COMMUNITY)
Admission: RE | Admit: 2020-07-02 | Discharge: 2020-07-02 | Disposition: A | Discharge status: Home / Self Care | Payer: PPO | Attending: Internal Medicine | Admitting: Internal Medicine

## 2020-07-02 DIAGNOSIS — R846 Abnormal cytological findings in specimens from respiratory organs and thorax: Secondary | ICD-10-CM | POA: Diagnosis not present

## 2020-07-02 DIAGNOSIS — R847 Abnormal histological findings in specimens from respiratory organs and thorax: Secondary | ICD-10-CM | POA: Diagnosis not present

## 2020-07-02 DIAGNOSIS — C3411 Malignant neoplasm of upper lobe, right bronchus or lung: Secondary | ICD-10-CM | POA: Diagnosis not present

## 2020-07-02 DIAGNOSIS — I700 Atherosclerosis of aorta: Secondary | ICD-10-CM | POA: Diagnosis not present

## 2020-07-02 DIAGNOSIS — I130 Hypertensive heart and chronic kidney disease with heart failure and stage 1 through stage 4 chronic kidney disease, or unspecified chronic kidney disease: Secondary | ICD-10-CM | POA: Diagnosis not present

## 2020-07-02 DIAGNOSIS — I5022 Chronic systolic (congestive) heart failure: Secondary | ICD-10-CM | POA: Diagnosis not present

## 2020-07-02 DIAGNOSIS — J850 Gangrene and necrosis of lung: Secondary | ICD-10-CM | POA: Diagnosis not present

## 2020-07-02 DIAGNOSIS — R918 Other nonspecific abnormal finding of lung field: Secondary | ICD-10-CM | POA: Diagnosis not present

## 2020-07-02 DIAGNOSIS — N183 Chronic kidney disease, stage 3 unspecified: Secondary | ICD-10-CM | POA: Diagnosis not present

## 2020-07-02 DIAGNOSIS — Z9889 Other specified postprocedural states: Secondary | ICD-10-CM

## 2020-07-02 DIAGNOSIS — Z87891 Personal history of nicotine dependence: Secondary | ICD-10-CM | POA: Diagnosis not present

## 2020-07-02 HISTORY — PX: BRONCHIAL WASHINGS: SHX5105

## 2020-07-02 HISTORY — PX: BRONCHIAL BRUSHINGS: SHX5108

## 2020-07-02 HISTORY — PX: LUNG BIOPSY: SHX5088

## 2020-07-02 HISTORY — PX: VIDEO BRONCHOSCOPY: SHX5072

## 2020-07-02 LAB — BODY FLUID CELL COUNT WITH DIFFERENTIAL
Other Cells, Fluid: UNDETERMINED %
Other Cells, Fluid: UNDETERMINED %
Total Nucleated Cell Count, Fluid: UNDETERMINED cu mm (ref 0–1000)
Total Nucleated Cell Count, Fluid: UNDETERMINED cu mm (ref 0–1000)

## 2020-07-02 SURGERY — VIDEO BRONCHOSCOPY WITHOUT FLUORO
Anesthesia: General | Laterality: Right

## 2020-07-02 MED ORDER — ONDANSETRON HCL 4 MG/2ML IJ SOLN
INTRAMUSCULAR | Status: DC | PRN
  Administered 2020-07-02: 4 mg via INTRAVENOUS

## 2020-07-02 MED ORDER — LIDOCAINE HCL 1 % IJ SOLN
INTRAMUSCULAR | Status: AC
  Filled 2020-07-02: qty 1

## 2020-07-02 MED ORDER — PROPOFOL 10 MG/ML IV BOLUS
INTRAVENOUS | Status: DC | PRN
  Administered 2020-07-02: 130 mg via INTRAVENOUS

## 2020-07-02 MED ORDER — LIDOCAINE 2% (20 MG/ML) 5 ML SYRINGE
INTRAMUSCULAR | Status: DC | PRN
  Administered 2020-07-02: 40 mg via INTRAVENOUS

## 2020-07-02 MED ORDER — ROCURONIUM BROMIDE 10 MG/ML (PF) SYRINGE
PREFILLED_SYRINGE | INTRAVENOUS | Status: DC | PRN
  Administered 2020-07-02: 60 mg via INTRAVENOUS

## 2020-07-02 MED ORDER — LACTATED RINGERS IV SOLN: INTRAVENOUS | Status: DC | PRN

## 2020-07-02 MED ORDER — FENTANYL CITRATE (PF) 100 MCG/2ML IJ SOLN
INTRAMUSCULAR | Status: AC
  Filled 2020-07-02: qty 2

## 2020-07-02 MED ORDER — FENTANYL CITRATE (PF) 100 MCG/2ML IJ SOLN
INTRAMUSCULAR | Status: DC | PRN
  Administered 2020-07-02 (×2): 50 ug via INTRAVENOUS

## 2020-07-02 MED ORDER — DEXAMETHASONE SODIUM PHOSPHATE 4 MG/ML IJ SOLN
INTRAMUSCULAR | Status: DC | PRN
  Administered 2020-07-02: 5 mg via INTRAVENOUS

## 2020-07-02 MED ORDER — BUTAMBEN-TETRACAINE-BENZOCAINE 2-2-14 % EX AERO: 1.0000 | INHALATION_SPRAY | Freq: Once | CUTANEOUS | Status: DC

## 2020-07-02 MED ORDER — ALBUTEROL SULFATE (2.5 MG/3ML) 0.083% IN NEBU
INHALATION_SOLUTION | RESPIRATORY_TRACT | Status: AC
  Filled 2020-07-02: qty 3

## 2020-07-02 MED ORDER — PHENYLEPHRINE HCL 0.25 % NA SOLN: 1.0000 | Freq: Four times a day (QID) | NASAL | Status: DC | PRN

## 2020-07-02 MED ORDER — ALBUTEROL SULFATE (2.5 MG/3ML) 0.083% IN NEBU
2.5000 mg | INHALATION_SOLUTION | Freq: Once | RESPIRATORY_TRACT | Status: AC
  Administered 2020-07-02: 2.5 mg via RESPIRATORY_TRACT

## 2020-07-02 MED ORDER — PROPOFOL 10 MG/ML IV BOLUS
INTRAVENOUS | Status: AC
  Filled 2020-07-02: qty 20

## 2020-07-02 MED ORDER — LIDOCAINE HCL URETHRAL/MUCOSAL 2 % EX GEL: 1.0000 "application " | Freq: Once | CUTANEOUS | Status: DC

## 2020-07-02 MED ORDER — SUGAMMADEX SODIUM 200 MG/2ML IV SOLN
INTRAVENOUS | Status: DC | PRN
  Administered 2020-07-02: 300 mg via INTRAVENOUS

## 2020-07-02 NOTE — Anesthesia Postprocedure Evaluation (Signed)
Anesthesia Post Note  Patient: Nicolas Butler  Procedure(s) Performed: VIDEO BRONCHOSCOPY WITHOUT FLUORO (Right )     Patient location during evaluation: PACU Anesthesia Type: General Level of consciousness: awake and alert, patient cooperative and oriented Pain management: pain level controlled Vital Signs Assessment: post-procedure vital signs reviewed and stable Respiratory status: spontaneous breathing, nonlabored ventilation and respiratory function stable Cardiovascular status: blood pressure returned to baseline and stable Postop Assessment: no apparent nausea or vomiting Anesthetic complications: no   No complications documented.  Last Vitals:  Vitals:   07/02/20 1500 07/02/20 1510  BP: (!) 161/87 (!) 179/101  Pulse: 62 64  Resp: (!) 22 17  Temp:    SpO2: 97% 91%    Last Pain:  Vitals:   07/02/20 1500  TempSrc:   PainSc: 0-No pain                 Jermey Closs,E. Khyan Oats

## 2020-07-02 NOTE — H&P (Signed)
Patient seen and evaluated in clinic in the last 30 days. Briefly has RUL endobronchial lesion concerning for primary lung cancer. Plan is for bronchoscopy with Bal and endobronchial biospy.  No changes since previous H&P. Ok to proceed with procedure.  Lenice Llamas, MD Pulmonary and [place] Pager: [place]

## 2020-07-02 NOTE — Anesthesia Procedure Notes (Signed)
Procedure Name: Intubation Date/Time: 07/02/2020 2:00 PM Performed by: Claudia Desanctis, CRNA Pre-anesthesia Checklist: Patient identified, Emergency Drugs available, Suction available and Patient being monitored Patient Re-evaluated:Patient Re-evaluated prior to induction Oxygen Delivery Method: Circle system utilized Preoxygenation: Pre-oxygenation with 100% oxygen Induction Type: IV induction Ventilation: Mask ventilation without difficulty Laryngoscope Size: 2 and Miller Grade View: Grade I Tube type: Oral Tube size: 8.5 mm Number of attempts: 1 Airway Equipment and Method: Stylet Placement Confirmation: ETT inserted through vocal cords under direct vision,  positive ETCO2,  breath sounds checked- equal and bilateral and CO2 detector Secured at: 22 cm Tube secured with: Tape Dental Injury: Teeth and Oropharynx as per pre-operative assessment

## 2020-07-02 NOTE — Op Note (Signed)
[facility] Cardiopulmonary Patient Name: Nicolas Butler Procedure Date: 07/02/2020 MRN: [id number] Attending MD: Senaida Ores. Shearon Stalls MD, MD Date of Birth: 1950/09/23 CSN: [id number] Age: 69 Admit Type: Outpatient Ethnicity: Not Hispanic or Latino Procedure:             Bronchoscopy Indications:           Right upper lobe mass, Right upper lobe lung mass                         suspicious for cancer Providers:             Senaida Ores. Shearon Stalls MD, MD, Cleda Daub, RN, Elspeth Cho Tech., Technician, Lesia Sago,                         Technician, Dellie Catholic Referring MD:           Medicines:             Sedation Required Anesthesia Staff Assistance, See the                         Anesthesia note for documentation of the administered                         medications Complications:         No immediate complications Estimated Blood Loss:  Estimated blood loss was minimal. Procedure:      Pre-Anesthesia Assessment:      - A History and Physical has been performed. The patient's medications,       allergies and sensitivities have been reviewed.      - The risks and benefits of the procedure and the sedation options and       risks were discussed with the patient. All questions were answered and       informed consent was obtained.      - CV Examination: RRR, no murmurs, no S3 or S4.      - Airway Examination: normal oropharyngeal airway.      - Mental Status Examination: alert and oriented.      - Respiratory Examination: clear to auscultation.      - The anesthesia plan was to use general anesthesia.      After obtaining informed consent, the bronchoscope was passed under       direct vision. Throughout the procedure, the patient's blood pressure,       pulse, and oxygen saturations were monitored continuously. the BF-H190       ([id number]) Olympus Bronchoscope was introduced through the mouth, via the       endotracheal tube (the patient  was intubated for the procedure) and       advanced to the tracheobronchial tree. Findings:      The entire tracheobronchial tree was examined and all subsegments were       intubated. There were trace thick secretions. The airway mucosa appeared       normal. A mobile endobronchial lesion was noted in the anterior segment       of the Right upper lobe. This was sampled with brushing and       endobroncihal biopsy. A BAL and bronchial washings were also collected.  Endobronchial biopsies of a mass were performed in the anterior segment       of the right upper lobe using a forceps and sent for histopathology       examination. 7 were obtained. Impression:      - Right upper lobe mass      - Right upper lobe lung mass suspicious for cancer      - No specimens collected. Moderate Sedation:      see anesthesia note Recommendation:      Discharge to home. Await test results and follow up in clinic as       scheduled.      - Await test results. Procedure Code(s):      --- Professional ---      [phone], Bronchoscopy, rigid or flexible, including fluoroscopic guidance,       when performed; diagnostic, with cell washing, when performed (separate       procedure) Diagnosis Code(s):      --- Professional ---      R91.8, Other nonspecific abnormal finding of lung field CPT copyright 2019 American Medical Association. All rights reserved. The codes documented in this report are preliminary and upon coder review may  be revised to meet current compliance requirements. Folashade Gamboa S. Shearon Stalls MD, MD 07/02/2020 2:49:35 PM This report has been signed electronically. Number of Addenda: 0 Scope In: Scope Out:

## 2020-07-02 NOTE — Anesthesia Preprocedure Evaluation (Addendum)
Anesthesia Evaluation  Patient identified by MRN, date of birth, ID band Patient awake    Reviewed: Allergy & Precautions, NPO status , Patient's Chart, lab work & pertinent test results, reviewed documented beta blocker date and time   History of Anesthesia Complications Negative for: history of anesthetic complications  Airway Mallampati: II  TM Distance: >3 FB Neck ROM: Full    Dental  (+) Poor Dentition, Chipped, Missing, Dental Advisory Given   Pulmonary shortness of breath, COPD,  COPD inhaler, former smoker,  06/29/2020 SARS coronavirus NEG Lung mass   breath sounds clear to auscultation       Cardiovascular hypertension, Pt. on medications and Pt. on home beta blockers (-) angina+ Peripheral Vascular Disease (AAA repaired '18)   Rhythm:Regular Rate:Normal  '18 Stress: There is a large area, moderate severity defect in the basal and mid inferolateral, inferior and apical inferior walls (SDS=6) consistent with ischemia in the LCX territory.  '18 ECHO: EF 60-65%, no significant valvular abnormalities   Neuro/Psych negative neurological ROS     GI/Hepatic negative GI ROS, Neg liver ROS,   Endo/Other  negative endocrine ROS  Renal/GU Renal InsufficiencyRenal disease     Musculoskeletal   Abdominal   Peds  Hematology   Anesthesia Other Findings   Reproductive/Obstetrics                            Anesthesia Physical Anesthesia Plan  ASA: III  Anesthesia Plan: General   Post-op Pain Management:    Induction: Intravenous  PONV Risk Score and Plan: 2 and Ondansetron, Dexamethasone and Treatment may vary due to age or medical condition  Airway Management Planned: Oral ETT  Additional Equipment: None  Intra-op Plan:   Post-operative Plan: Extubation in OR  Informed Consent: I have reviewed the patients History and Physical, chart, labs and discussed the procedure including the  risks, benefits and alternatives for the proposed anesthesia with the patient or authorized representative who has indicated his/her understanding and acceptance.     Dental advisory given  Plan Discussed with: CRNA and Surgeon  Anesthesia Plan Comments:        Anesthesia Quick Evaluation

## 2020-07-02 NOTE — Discharge Instructions (Signed)
Flexible Bronchoscopy, Care After This sheet gives you information about how to care for yourself after your test. Your doctor may also give you more specific instructions. If you have problems or questions, contact your doctor.  Follow these instructions at home: If low grade fevers at home, ok to take tylenol. These will subside within 24 hours.  Expect to have sore throat and maybe cough up a little blood. Please let us know if it is increasing in amount or more than just a few specks. Eating and drinking  Do not eat or drink anything (not even water) for 2 hours after your test, or until your numbing medicine (local anesthetic) wears off.  When your numbness is gone and your cough and gag reflexes have come back, you may: ? Eat only soft foods. ? Slowly drink liquids.  The day after the test, go back to your normal diet. Driving  Do not drive for 24 hours if you were given a medicine to help you relax (sedative).  Do not drive or use heavy machinery while taking prescription pain medicine. General instructions   Take over-the-counter and prescription medicines only as told by your doctor.  Return to your normal activities as told. Ask what activities are safe for you.  Do not use any products that have nicotine or tobacco in them. This includes cigarettes and e-cigarettes. If you need help quitting, ask your doctor.  Keep all follow-up visits as told by your doctor. This is important. It is very important if you had a tissue sample (biopsy) taken. Get help right away if:  You have shortness of breath that gets worse.  You get light-headed.  You feel like you are going to pass out (faint).  You have chest pain.  You cough up: ? More than a little blood. ? More blood than before. Summary  Do not eat or drink anything (not even water) for 2 hours after your test, or until your numbing medicine wears off.  Do not use cigarettes. Do not use e-cigarettes.  Get help  right away if you have chest pain. This information is not intended to replace advice given to you by your health care provider. Make sure you discuss any questions you have with your health care provider. Document Revised: 08/06/2017 Document Reviewed: 09/11/2016 Elsevier Patient Education  2020 [employer].

## 2020-07-02 NOTE — Transfer of Care (Signed)
Immediate Anesthesia Transfer of Care Note  Patient: Nathaniel Man  Procedure(s) Performed: VIDEO BRONCHOSCOPY WITHOUT FLUORO (Right )  Patient Location: PACU  Anesthesia Type:General  Level of Consciousness: awake and patient cooperative  Airway & Oxygen Therapy: Patient Spontanous Breathing and Patient connected to face mask  Post-op Assessment: Report given to RN and Post -op Vital signs reviewed and stable  Post vital signs: Reviewed and stable  Last Vitals:  Vitals Value Taken Time  BP    Temp    Pulse    Resp 20 07/02/20 1456  SpO2    Vitals shown include unvalidated device data.  Last Pain:  Vitals:   07/02/20 1155  TempSrc: Oral  PainSc: 0-No pain         Complications: No complications documented.

## 2020-07-03 ENCOUNTER — Encounter (HOSPITAL_COMMUNITY): Payer: Self-pay | Admitting: Internal Medicine

## 2020-07-03 ENCOUNTER — Telehealth: Payer: Self-pay | Admitting: *Deleted

## 2020-07-03 DIAGNOSIS — R918 Other nonspecific abnormal finding of lung field: Secondary | ICD-10-CM

## 2020-07-03 LAB — PNEUMOCYSTIS JIROVECI SMEAR BY DFA: Pneumocystis jiroveci Ag: NEGATIVE

## 2020-07-03 NOTE — Telephone Encounter (Signed)
I received referral on Nicolas Butler.  I called and updated on appt, spoke with daughter.

## 2020-07-04 LAB — SURGICAL PATHOLOGY

## 2020-07-04 LAB — CYTOLOGY - NON PAP

## 2020-07-05 LAB — ACID FAST SMEAR (AFB, MYCOBACTERIA): Acid Fast Smear: NEGATIVE

## 2020-07-05 LAB — CULTURE, RESPIRATORY W GRAM STAIN: Culture: NORMAL

## 2020-07-06 DIAGNOSIS — E7849 Other hyperlipidemia: Secondary | ICD-10-CM | POA: Diagnosis not present

## 2020-07-06 DIAGNOSIS — I129 Hypertensive chronic kidney disease with stage 1 through stage 4 chronic kidney disease, or unspecified chronic kidney disease: Secondary | ICD-10-CM | POA: Diagnosis not present

## 2020-07-06 DIAGNOSIS — N183 Chronic kidney disease, stage 3 unspecified: Secondary | ICD-10-CM | POA: Diagnosis not present

## 2020-07-11 ENCOUNTER — Encounter: Payer: Self-pay | Admitting: Internal Medicine

## 2020-07-11 ENCOUNTER — Ambulatory Visit
Admission: RE | Admit: 2020-07-11 | Discharge: 2020-07-11 | Disposition: A | Discharge status: Home / Self Care | Payer: PPO | Source: Ambulatory Visit | Attending: Radiation Oncology | Admitting: Radiation Oncology

## 2020-07-11 ENCOUNTER — Inpatient Hospital Stay: Payer: PPO | Attending: Internal Medicine | Admitting: Internal Medicine

## 2020-07-11 ENCOUNTER — Ambulatory Visit: Payer: PPO | Admitting: Radiation Oncology

## 2020-07-11 ENCOUNTER — Encounter: Payer: Self-pay | Admitting: *Deleted

## 2020-07-11 ENCOUNTER — Other Ambulatory Visit: Payer: Self-pay

## 2020-07-11 ENCOUNTER — Institutional Professional Consult (permissible substitution) (INDEPENDENT_AMBULATORY_CARE_PROVIDER_SITE_OTHER): Payer: PPO | Admitting: Thoracic Surgery (Cardiothoracic Vascular Surgery)

## 2020-07-11 ENCOUNTER — Other Ambulatory Visit: Payer: Self-pay | Admitting: *Deleted

## 2020-07-11 ENCOUNTER — Inpatient Hospital Stay: Payer: PPO

## 2020-07-11 VITALS — BP 142/80 | HR 54 | Temp 97.7°F | Resp 18 | Wt 199.8 lb

## 2020-07-11 VITALS — BP 142/88 | HR 57 | Temp 97.7°F | Resp 18 | Ht 69.0 in | Wt 199.8 lb

## 2020-07-11 DIAGNOSIS — Z87891 Personal history of nicotine dependence: Secondary | ICD-10-CM | POA: Diagnosis not present

## 2020-07-11 DIAGNOSIS — C3411 Malignant neoplasm of upper lobe, right bronchus or lung: Secondary | ICD-10-CM | POA: Insufficient documentation

## 2020-07-11 DIAGNOSIS — R918 Other nonspecific abnormal finding of lung field: Secondary | ICD-10-CM

## 2020-07-11 DIAGNOSIS — I10 Essential (primary) hypertension: Secondary | ICD-10-CM | POA: Diagnosis not present

## 2020-07-11 DIAGNOSIS — C3401 Malignant neoplasm of right main bronchus: Secondary | ICD-10-CM | POA: Diagnosis not present

## 2020-07-11 LAB — CMP (CANCER CENTER ONLY)
ALT: 15 U/L (ref 0–44)
AST: 14 U/L — ABNORMAL LOW (ref 15–41)
Albumin: 4 g/dL (ref 3.5–5.0)
Alkaline Phosphatase: 94 U/L (ref 38–126)
Anion gap: 5 (ref 5–15)
BUN: 16 mg/dL (ref 8–23)
CO2: 31 mmol/L (ref 22–32)
Calcium: 9.5 mg/dL (ref 8.9–10.3)
Chloride: 102 mmol/L (ref 98–111)
Creatinine: 1.4 mg/dL — ABNORMAL HIGH (ref 0.61–1.24)
GFR, Estimated: 54 mL/min — ABNORMAL LOW (ref >60)
Glucose, Bld: 75 mg/dL (ref 70–99)
Potassium: 4.2 mmol/L (ref 3.5–5.1)
Sodium: 138 mmol/L (ref 135–145)
Total Bilirubin: 0.8 mg/dL (ref 0.3–1.2)
Total Protein: 7.7 g/dL (ref 6.5–8.1)

## 2020-07-11 LAB — CBC WITH DIFFERENTIAL (CANCER CENTER ONLY)
Abs Immature Granulocytes: 0.01 10*3/uL (ref 0.00–0.07)
Basophils Absolute: 0.1 10*3/uL (ref 0.0–0.1)
Basophils Relative: 1 %
Eosinophils Absolute: 0.2 10*3/uL (ref 0.0–0.5)
Eosinophils Relative: 3 %
HCT: 45.8 % (ref 39.0–52.0)
Hemoglobin: 15.8 g/dL (ref 13.0–17.0)
Immature Granulocytes: 0 %
Lymphocytes Relative: 31 %
Lymphs Abs: 2.3 10*3/uL (ref 0.7–4.0)
MCH: 32.9 pg (ref 26.0–34.0)
MCHC: 34.5 g/dL (ref 30.0–36.0)
MCV: 95.4 fL (ref 80.0–100.0)
Monocytes Absolute: 0.8 10*3/uL (ref 0.1–1.0)
Monocytes Relative: 10 %
Neutro Abs: 4 10*3/uL (ref 1.7–7.7)
Neutrophils Relative %: 55 %
Platelet Count: 197 10*3/uL (ref 150–400)
RBC: 4.8 MIL/uL (ref 4.22–5.81)
RDW: 12.8 % (ref 11.5–15.5)
WBC Count: 7.3 10*3/uL (ref 4.0–10.5)
nRBC: 0 % (ref 0.0–0.2)

## 2020-07-11 NOTE — Progress Notes (Signed)
PCP is Lavella Lemons, PA Referring Provider is Spero Geralds, MD   Nicolas Butler is sent for consultation re: poorly differentiated carcinoma of the lung  HPI: Nicolas Butler is a 69 yo man with a history of AAA, systolic heart failure- resolved, stage III CKD, hypertension and hyperlipidemia. Smoked 1.5 ppd x for 46 years prior to quitting in 2014. Recently saw Dr. Luciana Axe. Underwent a low dose screening CT and was found to have a right upper lobe mass. PET CT showed the mass was hypermetabolic, but no evidence of regional or distant metastasis. Dr. Shearon Stalls did bronchoscopy on 07/01/2020. + endobronchial lesion in anterior segment of right upper lobe. Biopsy positive for poorly differentiated carcinoma.  He walks about 1 1/2 miles per day. Some SOB with walking up inclines but does not have to stop. No chest pain, pressure or tightness. Denies headaches, visual changes. No change in appetite or weight loss.  Zubrod Score: At the time of surgery this patient's most appropriate activity status/level should be described as: _0     0    Normal activity, no symptoms _1     1    Restricted in physical strenuous activity but ambulatory, able to do out light work _2     2    Ambulatory and capable of self care, unable to do work activities, up and about >50 % of waking hours                              _3     3    Only limited self care, in bed greater than 50% of waking hours _4     4    Completely disabled, no self care, confined to bed or chair _5     5    Moribund  Past Medical History:  Diagnosis Date  . AAA (abdominal aortic aneurysm) ([place])   . Chronic kidney disease (CKD), stage III (moderate) ([facility])   . Complication of anesthesia    Bowel and bladder are slow to wake up after surgery  . High cholesterol   . Unspecified essential hypertension     Past Surgical History:  Procedure Laterality Date  . ABDOMINAL AORTIC ANEURYSM REPAIR N/A 06/28/2017   Procedure: ANEURYSM ABDOMINAL AORTIC REPAIR  OPEN;  Surgeon: Elam Dutch, MD;  Location: [place];  Service: Vascular;  Laterality: N/A;  . BRONCHIAL BRUSHINGS  07/02/2020   Procedure: BRONCHIAL BRUSHINGS;  Surgeon: Spero Geralds, MD;  Location: [facility];  Service: Pulmonary;;  . BRONCHIAL WASHINGS  07/02/2020   Procedure: BRONCHIAL WASHINGS;  Surgeon: Spero Geralds, MD;  Location: [facility];  Service: Pulmonary;;  BAL and washings  . HAND SURGERY Left   . INCISIONAL HERNIA REPAIR     LEFT  . IRRIGATION AND DEBRIDEMENT SEBACEOUS CYST     FROM RIGHT SHOULDER  . LUNG BIOPSY  07/02/2020   Procedure: LUNG BIOPSY;  Surgeon: Spero Geralds, MD;  Location: [facility];  Service: Pulmonary;;  . UMBILICAL HERNIA REPAIR    . VIDEO BRONCHOSCOPY Right 07/02/2020   Procedure: VIDEO BRONCHOSCOPY WITHOUT FLUORO;  Surgeon: Spero Geralds, MD;  Location: [facility];  Service: Pulmonary;  Laterality: Right;    Family History  Problem Relation Age of Onset  . Pneumonia Mother   . Other Father        brain tumor  . Diabetes Son   . Lung cancer Neg Hx     Social History Social  History   Tobacco Use  . Smoking status: Former Smoker    Packs/day: 1.50    Years: 46.00    Pack years: 69.00    Types: Cigarettes    Start date: 05/17/1965    Quit date: 12/08/2012    Years since quitting: 7.5  . Smokeless tobacco: Never Used  Vaping Use  . Vaping Use: Never used  Substance Use Topics  . Alcohol use: No    Alcohol/week: 0.0 standard drinks  . Drug use: No    Current Outpatient Medications  Medication Sig Dispense Refill  . albuterol (PROVENTIL) (2.5 MG/3ML) 0.083% nebulizer solution Take 2.5 mg by nebulization every 6 (six) hours as needed for wheezing or shortness of breath.     Marland Kitchen amLODipine (NORVASC) 10 MG tablet Take 10 mg by mouth daily.  3  . atorvastatin (LIPITOR) 80 MG tablet Take 80 mg by mouth every other day. AT NIGHT    . carvedilol (COREG) 3.125 MG tablet Take 1 tablet (3.125 mg total) by mouth 2 (two) times  daily. 60 tablet 6  . Cholecalciferol (VITAMIN D3) 5000 units CAPS Take 5,000 Units by mouth daily after breakfast.     . lisinopril (PRINIVIL,ZESTRIL) 40 MG tablet Take 1 tablet (40 mg total) by mouth daily. 30 tablet 6   No current facility-administered medications for this visit.    Allergies  Allergen Reactions  . Hydrochlorothiazide Other (See Comments)    Pt states "it made my legs hurt"  . Pravastatin Other (See Comments)    Muscle pain    Review of Systems  Constitutional: Negative for activity change, appetite change and unexpected weight change.  HENT: Negative for trouble swallowing and voice change.   Respiratory: Positive for shortness of breath (heavy exertion) and wheezing (occasional).   Cardiovascular: Negative for chest pain, palpitations and leg swelling.  Gastrointestinal: Negative for abdominal distention and abdominal pain.  Genitourinary: Positive for frequency. Negative for difficulty urinating and dysuria.  Musculoskeletal: Negative for arthralgias and myalgias.  Neurological: Negative for seizures, syncope and weakness.  Hematological: Negative for adenopathy. Does not bruise/bleed easily.  All other systems reviewed and are negative.   BP (!) 142/80   Pulse (!) 54   Temp 97.7 F (36.5 C)   Resp 18   Wt 199 lb 12.8 oz (90.6 kg)   SpO2 97%   BMI 29.51 kg/m  Physical Exam Vitals reviewed.  Constitutional:      General: He is not in acute distress.    Appearance: Normal appearance.  HENT:     Head: Normocephalic and atraumatic.  Eyes:     General: No scleral icterus.    Extraocular Movements: Extraocular movements intact.  Neck:     Vascular: No carotid bruit.  Cardiovascular:     Rate and Rhythm: Normal rate and regular rhythm.     Heart sounds: Normal heart sounds. No murmur heard.  No friction rub. No gallop.   Pulmonary:     Effort: Pulmonary effort is normal. No respiratory distress.     Breath sounds: Normal breath sounds. No  wheezing or rales.  Abdominal:     General: There is no distension.     Palpations: Abdomen is soft.     Tenderness: There is no abdominal tenderness.  Musculoskeletal:     Cervical back: Neck supple.  Lymphadenopathy:     Cervical: No cervical adenopathy.  Skin:    General: Skin is warm and dry.  Neurological:     General: No focal  deficit present.     Mental Status: He is alert and oriented to person, place, and time.     Cranial Nerves: No cranial nerve deficit.     Motor: No weakness.     Gait: Gait normal.    Diagnostic Tests: NUCLEAR MEDICINE PET SKULL BASE TO THIGH  TECHNIQUE: 9.3 mCi F-18 FDG was injected intravenously. Full-ring PET imaging was performed from the skull base to thigh after the radiotracer. CT data was obtained and used for attenuation correction and anatomic localization.  Fasting blood glucose: 81 mg/dl  COMPARISON:  None.  FINDINGS: Mediastinal blood pool activity: SUV max 2.75  Liver activity: SUV max NA  NECK: No hypermetabolic lymph nodes in the neck.  Incidental CT findings: none  CHEST: No right upper lobe central perihilar nodule measures 2.9 x 1.5 cm with SUV max of 14.51. Anteromedial right upper lobe distal airway enlargement appears FDG avid with SUV max of 13.6 concerning for endobronchial spread of tumor. Multiple tiny nodules are identified within the periphery of the anterior right upper lobe as well as the anterior basal right upper lobe. These are too small to reliably characterize, image 27/8 and image 37/8. Many of these have a tree-in-bud configuration reflecting inflammatory/infectious bronchiolitis.  Within the posterior right lower lobe there is a tiny 4 mm nodule, image 49/8. This is too small to reliably characterize. No FDG avid mediastinal, left hilar, or subcarinal lymph nodes. No FDG avid axillary or supraclavicular lymph nodes.  Incidental CT findings: Aortic atherosclerosis. Coronary  artery calcifications. No pericardial effusion. Emphysema.  ABDOMEN/PELVIS: No abnormal FDG uptake within the liver, pancreas, or spleen. Right adrenal gland adenoma measures 1.8 cm. Small low-attenuation nodules within the right adrenal gland are identified with mild FDG uptake. Index left adrenal nodule measures 1.4 x 1.8 cm and -4.55 Hounsfield units compatible with a benign adenoma. Low level FDG uptake within this nodule has an SUV max of 4.3. Smaller adenoma measures 1.2 cm and -3.46 Hounsfield units. This has an SUV max of 4.53. Increased uptake at the level of the GE junction is noted without corresponding soft tissue mass. SUV max is equal to 5.78. No FDG avid abdominopelvic lymph nodes.  Incidental CT findings: Signs of previous abdominal aortic aneurysm repair. Aortic atherosclerosis. 3.1 cm right kidney cyst.  SKELETON: No focal hypermetabolic activity to suggest skeletal metastasis.  Incidental CT findings: none  IMPRESSION: 1. Right upper lobe central perihilar lesion is intensely hypermetabolic compatible with primary bronchogenic carcinoma. Signs of endobronchial spread of tumor is identified with FDG avid distended distal airways noted within the anteromedial right upper lobe. 2. No signs of FDG avid mediastinal or contralateral hilar nodal metastasis or distant metastatic disease. 3. Multiple tiny peripheral nodules are identified within the right upper lobe. Many of these have a tree-in-bud configuration and may represent areas of bronchiolitis. Small foci metastasis not excluded. 4. Bilateral adrenal gland adenomas. 5. Aortic Atherosclerosis (ICD10-I70.0) and Emphysema (ICD10-J43.9). Coronary artery calcifications.   Electronically Signed   By: Kerby Moors M.D.   On: 06/28/2020 10:39 I personally reviewed the PET/CT images and concur with the findings noted above  Impression: Nicolas Butler is a 69 yo man with past history of hypertension,  hyperlipidemia, stage III CKD, AAA repair in 2018 and remote tobacco abuse. 70 pack year history of smoking, quit 7 years ago.  He had a low dose screening CT and was found to have a right upper lobe lung mass. Hypermetabolic on PET/ CT. Bronchoscopy with biopsy  revealed a poorly differentiated carcinoma. Clinical stage IA (T1N0).  He has a good functional status and should be able to tolerate a lobectomy without issue. Does need PFTs to confirm adequate pulmonary reserve but more of a formality given his exercise tolerance.  We discussed the treatment options of surgery and radiation and relative advantages and disadvantages of each. I recommended we proceed with robotic right upper lobectomy.  I discussed the general nature of the procedure, the need for general anesthesia, the incisions to be used, and the use of a drainage postoperatively with Nicolas Butler.  We discussed the expected hospital stay, overall recovery and short and long term outcomes. He understands the risks include, but are not limited to death, stroke, MI, DVT/PE, bleeding, possible need for transfusion, infections, prolonged air leaks, cardiac arrhythmias, conversion to thoracotomy, as well as other organ system dysfunction including respiratory, renal, or GI complications.   He accepts the risks and agrees to proceed.  Plan: PFTs Robotic right upper lobectomy on Monday 11/15  Melrose Nakayama, MD [facility] [phone]

## 2020-07-11 NOTE — Hospital Course (Signed)
Patient seen and examined in pre-op area.  No interval changes. Ok to proceed with bronchoscopy.

## 2020-07-11 NOTE — Interval H&P Note (Signed)
History and Physical Interval Note:  07/11/2020 7:49 AM  Nicolas Butler  has presented today for surgery, with the diagnosis of RIGHT UPPER LUNG MASS.  The various methods of treatment have been discussed with the patient and family. After consideration of risks, benefits and other options for treatment, the patient has consented to  Procedure(s) with comments: VIDEO BRONCHOSCOPY WITHOUT FLUORO (Right) LUNG BIOPSY BRONCHIAL WASHINGS - BAL and washings BRONCHIAL BRUSHINGS as a surgical intervention.  The patient's history has been reviewed, patient examined, no change in status, stable for surgery.  I have reviewed the patient's chart and labs.  Questions were answered to the patient's satisfaction.     Spero Geralds

## 2020-07-11 NOTE — Progress Notes (Signed)
The proposed treatment discussed in cancer conference 07/11/20 is for discussion purpose only and is not a binding recommendation.  The patient was not physically examined nor present for their treatment options.  Therefore, final treatment plans cannot be decided.

## 2020-07-11 NOTE — Progress Notes (Signed)
Radiation Oncology         [phone] ________________________________  Name: Nicolas Butler        MRN: [id number]  Date of Service: 07/11/2020 DOB: 09-23-1950  IO:MBTD, Grace Bushy, PA  Spero Geralds, MD     REFERRING PHYSICIAN: Spero Geralds, MD   DIAGNOSIS: The encounter diagnosis was Malignant neoplasm of upper lobe of right lung [facility]).   HISTORY OF PRESENT ILLNESS: Nicolas Butler is a 69 y.o. male seen at the request of Dr. Shearon Stalls for a newly diagnosed lung cancer. The patient has a prior smoking history and was counseled on a lung cancer screening exam through [facility]. This revealed a LUNG-RADS 4BS criteria with multiple pulmonary nodules throughout the lungs bilaterally with an ovoid lesion in the RUL being the dominant finding. A PET scan on 06/27/20 revealed a 2.9 x 1.5 cm RUL mass with an SUV of 14.51. There was concern for either mucoid plugging  Versus possible endobronchial spread with multiple tiny nodules in the periphery of the anterior RUL that were too small to characterize. A 4 mm nodule in the RLL was also too small to characterize. No adenopathy or metastatic findings were seen. 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 He's seen in thoracic oncology clinic to discuss options of treatment.    PREVIOUS RADIATION THERAPY: No   PAST MEDICAL HISTORY:  Past Medical History:  Diagnosis Date  . AAA (abdominal aortic aneurysm) ([place])   . Chronic kidney disease (CKD), stage III (moderate) (HCC)   . Complication of anesthesia    Bowel and bladder are slow to wake up after surgery  . High cholesterol   . Unspecified essential hypertension        PAST SURGICAL HISTORY: Past Surgical History:  Procedure Laterality Date  . ABDOMINAL AORTIC ANEURYSM REPAIR N/A 06/28/2017   Procedure: ANEURYSM ABDOMINAL AORTIC REPAIR OPEN;  Surgeon: Elam Dutch, MD;  Location: [place];  Service: Vascular;  Laterality: N/A;  . BRONCHIAL BRUSHINGS  07/02/2020    Procedure: BRONCHIAL BRUSHINGS;  Surgeon: Spero Geralds, MD;  Location: [facility];  Service: Pulmonary;;  . BRONCHIAL WASHINGS  07/02/2020   Procedure: BRONCHIAL WASHINGS;  Surgeon: Spero Geralds, MD;  Location: [facility];  Service: Pulmonary;;  BAL and washings  . HAND SURGERY Left   . INCISIONAL HERNIA REPAIR     LEFT  . IRRIGATION AND DEBRIDEMENT SEBACEOUS CYST     FROM RIGHT SHOULDER  . LUNG BIOPSY  07/02/2020   Procedure: LUNG BIOPSY;  Surgeon: Spero Geralds, MD;  Location: [facility];  Service: Pulmonary;;  . UMBILICAL HERNIA REPAIR    . VIDEO BRONCHOSCOPY Right 07/02/2020   Procedure: VIDEO BRONCHOSCOPY WITHOUT FLUORO;  Surgeon: Spero Geralds, MD;  Location: [facility];  Service: Pulmonary;  Laterality: Right;     FAMILY HISTORY:  Family History  Problem Relation Age of Onset  . Pneumonia Mother   . Other Father        brain tumor  . Diabetes Son   . Lung cancer Neg Hx      SOCIAL HISTORY:  reports that he quit smoking about 7 years ago. His smoking use included cigarettes. He started smoking about 55 years ago. He has a 69.00 pack-year smoking history. He has never used smokeless tobacco. He reports that he does not drink alcohol and does not use drugs.   ALLERGIES: Hydrochlorothiazide and Pravastatin   MEDICATIONS:  Current Outpatient Medications  Medication Sig  Dispense Refill  . albuterol (PROVENTIL) (2.5 MG/3ML) 0.083% nebulizer solution Take 2.5 mg by nebulization every 6 (six) hours as needed for wheezing or shortness of breath.     Marland Kitchen amLODipine (NORVASC) 10 MG tablet Take 10 mg by mouth daily.  3  . atorvastatin (LIPITOR) 80 MG tablet Take 80 mg by mouth every other day. AT NIGHT    . carvedilol (COREG) 3.125 MG tablet Take 1 tablet (3.125 mg total) by mouth 2 (two) times daily. 60 tablet 6  . Cholecalciferol (VITAMIN D3) 5000 units CAPS Take 5,000 Units by mouth daily after breakfast.     . lisinopril (PRINIVIL,ZESTRIL) 40 MG tablet Take 1  tablet (40 mg total) by mouth daily. 30 tablet 6   No current facility-administered medications for this encounter.     REVIEW OF SYSTEMS: On review of systems, the patient reports that he is doing well overall. He has shortness of breath only with exertion. He walks 1.5 miles per day and states his course he walks has elevations and changes in terrain. He does not have limitations during this exercise and does not have to stop to catch his breath. he can go up and down stairs without difficulty. He denies any chest pain,  fevers, chills, night sweats, unintended weight changes. He had constipation a few days following his bronchoscopy but otherwise does not have any issues with his GI tract. He denies any  bladder disturbances, and denies abdominal pain, nausea or vomiting. He denies any new musculoskeletal or joint aches or pains. A complete review of systems is obtained and is otherwise negative.     PHYSICAL EXAM:  Wt Readings from Last 3 Encounters:  07/11/20 199 lb 12.8 oz (90.6 kg)  06/18/20 200 lb 3.2 oz (90.8 kg)  05/08/19 194 lb 12.8 oz (88.4 kg)   Temp Readings from Last 3 Encounters:  07/11/20 97.7 F (36.5 C) (Tympanic)  07/02/20 (!) 96.8 F (36 C) (Tympanic)  06/18/20 98.8 F (37.1 C) (Oral)   BP Readings from Last 3 Encounters:  07/11/20 (!) 142/88  07/02/20 (!) 141/54  06/18/20 134/70   Pulse Readings from Last 3 Encounters:  07/11/20 (!) 57  07/02/20 64  06/18/20 61   In general this is a well appearing caucasian male in no acute distress. He's alert and oriented x4 and appropriate throughout the examination. Cardiopulmonary assessment is negative for acute distress and he exhibits normal effort.     ECOG = 1  0 - Asymptomatic (Fully active, able to carry on all predisease activities without restriction)  1 - Symptomatic but completely ambulatory (Restricted in physically strenuous activity but ambulatory and able to carry out work of a light or sedentary  nature. For example, light housework, office work)  2 - Symptomatic, <50% in bed during the day (Ambulatory and capable of all self care but unable to carry out any work activities. Up and about more than 50% of waking hours)  3 - Symptomatic, >50% in bed, but not bedbound (Capable of only limited self-care, confined to bed or chair 50% or more of waking hours)  4 - Bedbound (Completely disabled. Cannot carry on any self-care. Totally confined to bed or chair)  5 - Death   Eustace Pen MM, Creech RH, Tormey DC, et al. [phone]). "Toxicity and response criteria of the [facility]". [place] Oncol. 5 (6): 649-55    LABORATORY DATA:  Lab Results  Component Value Date   WBC 7.3 07/11/2020   HGB  15.8 07/11/2020   HCT 45.8 07/11/2020   MCV 95.4 07/11/2020   PLT 197 07/11/2020   Lab Results  Component Value Date   NA 133 (L) 07/03/2017   K 4.4 07/03/2017   CL 103 07/03/2017   CO2 25 07/03/2017   Lab Results  Component Value Date   ALT 18 06/29/2017   AST 23 06/29/2017   ALKPHOS 58 06/29/2017   BILITOT 0.8 06/29/2017      RADIOGRAPHY: NM PET Image Initial (PI) Skull Base To Thigh  Result Date: 06/28/2020 CLINICAL DATA:  Initial treatment strategy for lung nodule. EXAM: NUCLEAR MEDICINE PET SKULL BASE TO THIGH TECHNIQUE: 9.3 mCi F-18 FDG was injected intravenously. Full-ring PET imaging was performed from the skull base to thigh after the radiotracer. CT data was obtained and used for attenuation correction and anatomic localization. Fasting blood glucose: 81 mg/dl COMPARISON:  None. FINDINGS: Mediastinal blood pool activity: SUV max 2.75 Liver activity: SUV max NA NECK: No hypermetabolic lymph nodes in the neck. Incidental CT findings: none CHEST: No right upper lobe central perihilar nodule measures 2.9 x 1.5 cm with SUV max of 14.51. Anteromedial right upper lobe distal airway enlargement appears FDG avid with SUV max of 13.6 concerning for endobronchial spread of  tumor. Multiple tiny nodules are identified within the periphery of the anterior right upper lobe as well as the anterior basal right upper lobe. These are too small to reliably characterize, image 27/8 and image 37/8. Many of these have a tree-in-bud configuration reflecting inflammatory/infectious bronchiolitis. Within the posterior right lower lobe there is a tiny 4 mm nodule, image 49/8. This is too small to reliably characterize. No FDG avid mediastinal, left hilar, or subcarinal lymph nodes. No FDG avid axillary or supraclavicular lymph nodes. Incidental CT findings: Aortic atherosclerosis. Coronary artery calcifications. No pericardial effusion. Emphysema. ABDOMEN/PELVIS: No abnormal FDG uptake within the liver, pancreas, or spleen. Right adrenal gland adenoma measures 1.8 cm. Small low-attenuation nodules within the right adrenal gland are identified with mild FDG uptake. Index left adrenal nodule measures 1.4 x 1.8 cm and -4.55 Hounsfield units compatible with a benign adenoma. Low level FDG uptake within this nodule has an SUV max of 4.3. Smaller adenoma measures 1.2 cm and -3.46 Hounsfield units. This has an SUV max of 4.53. Increased uptake at the level of the GE junction is noted without corresponding soft tissue mass. SUV max is equal to 5.78. No FDG avid abdominopelvic lymph nodes. Incidental CT findings: Signs of previous abdominal aortic aneurysm repair. Aortic atherosclerosis. 3.1 cm right kidney cyst. SKELETON: No focal hypermetabolic activity to suggest skeletal metastasis. Incidental CT findings: none IMPRESSION: 1. Right upper lobe central perihilar lesion is intensely hypermetabolic compatible with primary bronchogenic carcinoma. Signs of endobronchial spread of tumor is identified with FDG avid distended distal airways noted within the anteromedial right upper lobe. 2. No signs of FDG avid mediastinal or contralateral hilar nodal metastasis or distant metastatic disease. 3. Multiple tiny  peripheral nodules are identified within the right upper lobe. Many of these have a tree-in-bud configuration and may represent areas of bronchiolitis. Small foci metastasis not excluded. 4. Bilateral adrenal gland adenomas. 5. Aortic Atherosclerosis (ICD10-I70.0) and Emphysema (ICD10-J43.9). Coronary artery calcifications. Electronically Signed   By: Kerby Moors M.D.   On: 06/28/2020 10:39   DG CHEST PORT 1 VIEW  Result Date: 07/02/2020 CLINICAL DATA:  Status post bronchoscopy EXAM: PORTABLE CHEST 1 VIEW COMPARISON:  Chest radiograph June 29, 2017; PET-CT June 27, 2020 FINDINGS: No pneumothorax.  Soft tissue prominence is noted in the superior right hilar region at the site of metabolic active lesion seen on PET study. No edema or airspace opacity. Heart size and pulmonary vascularity are normal. There is aortic atherosclerosis. No bone lesions. IMPRESSION: No pneumothorax. Mass in the right suprahilar region, better seen on recent PET-CT examination. No edema or airspace opacity. Cardiac silhouette within normal limits. Aortic Atherosclerosis (ICD10-I70.0). Electronically Signed   By: Lowella Grip III M.D.   On: 07/02/2020 15:34       IMPRESSION/PLAN: 1. Stage IA3, cT1cN0M0, NSCLC, squamous cell carcinoma of the RUL. Dr. Lisbeth Renshaw discusses the pathology findings and reviews the nature of early stage lung cancer. We reviewed that surgery is the gold standard treatment for this condition in patients who are medically fit for surgery. The patient appears to be a good candidate for this based on his physical condition and stamina for exercise. We reviewed that radiotherapy can be an alternative. While SBRT style therapy is preferred, a traditional SBRT course of 3-5 fractions would not be recommended due to the central location of his tumor. Rather, if radiotherapy were considered, it would be 10-15 fractions. We discussed the risks, benefits, short, and long term effects of radiotherapy, as well  as delivery and logistics of radiotherapy. We will follow up with his visit with Dr. Roxan Hockey today, but anticipate he would proceed with surgery. We'd be happy to see him back as needed moving forward.    In a visit lasting 45 minutes, greater than 50% of the time was spent face to face discussing the patient's condition, in preparation for the discussion, and coordinating the patient's care.   The above documentation reflects my direct findings during this shared patient visit. Please see the separate note by Dr. Lisbeth Renshaw on this date for the remainder of the patient's plan of care.    Carola Rhine, PAC

## 2020-07-11 NOTE — H&P (View-Only) (Signed)
PCP is Lavella Lemons, PA Referring Provider is Spero Geralds, MD   Nicolas Butler is sent for consultation re: poorly differentiated carcinoma of the lung  HPI: Nicolas Butler is a 69 yo man with a history of AAA, systolic heart failure- resolved, stage III CKD, hypertension and hyperlipidemia. Smoked 1.5 ppd x for 46 years prior to quitting in 2014. Recently saw Dr. Luciana Axe. Underwent a low dose screening CT and was found to have a right upper lobe mass. PET CT showed the mass was hypermetabolic, but no evidence of regional or distant metastasis. Dr. Shearon Stalls did bronchoscopy on 07/01/2020. + endobronchial lesion in anterior segment of right upper lobe. Biopsy positive for poorly differentiated carcinoma.  He walks about 1 1/2 miles per day. Some SOB with walking up inclines but does not have to stop. No chest pain, pressure or tightness. Denies headaches, visual changes. No change in appetite or weight loss.  Zubrod Score: At the time of surgery this patient's most appropriate activity status/level should be described as: _0     0    Normal activity, no symptoms _1     1    Restricted in physical strenuous activity but ambulatory, able to do out light work _2     2    Ambulatory and capable of self care, unable to do work activities, up and about >50 % of waking hours                              _3     3    Only limited self care, in bed greater than 50% of waking hours _4     4    Completely disabled, no self care, confined to bed or chair _5     5    Moribund  Past Medical History:  Diagnosis Date  . AAA (abdominal aortic aneurysm) ([place])   . Chronic kidney disease (CKD), stage III (moderate) (HCC)   . Complication of anesthesia    Bowel and bladder are slow to wake up after surgery  . High cholesterol   . Unspecified essential hypertension     Past Surgical History:  Procedure Laterality Date  . ABDOMINAL AORTIC ANEURYSM REPAIR N/A 06/28/2017   Procedure: ANEURYSM ABDOMINAL AORTIC REPAIR  OPEN;  Surgeon: Elam Dutch, MD;  Location: [place];  Service: Vascular;  Laterality: N/A;  . BRONCHIAL BRUSHINGS  07/02/2020   Procedure: BRONCHIAL BRUSHINGS;  Surgeon: Spero Geralds, MD;  Location: [facility];  Service: Pulmonary;;  . BRONCHIAL WASHINGS  07/02/2020   Procedure: BRONCHIAL WASHINGS;  Surgeon: Spero Geralds, MD;  Location: [facility];  Service: Pulmonary;;  BAL and washings  . HAND SURGERY Left   . INCISIONAL HERNIA REPAIR     LEFT  . IRRIGATION AND DEBRIDEMENT SEBACEOUS CYST     FROM RIGHT SHOULDER  . LUNG BIOPSY  07/02/2020   Procedure: LUNG BIOPSY;  Surgeon: Spero Geralds, MD;  Location: [facility];  Service: Pulmonary;;  . UMBILICAL HERNIA REPAIR    . VIDEO BRONCHOSCOPY Right 07/02/2020   Procedure: VIDEO BRONCHOSCOPY WITHOUT FLUORO;  Surgeon: Spero Geralds, MD;  Location: [facility];  Service: Pulmonary;  Laterality: Right;    Family History  Problem Relation Age of Onset  . Pneumonia Mother   . Other Father        brain tumor  . Diabetes Son   . Lung cancer Neg Hx     Social History Social  History   Tobacco Use  . Smoking status: Former Smoker    Packs/day: 1.50    Years: 46.00    Pack years: 69.00    Types: Cigarettes    Start date: 05/17/1965    Quit date: 12/08/2012    Years since quitting: 7.5  . Smokeless tobacco: Never Used  Vaping Use  . Vaping Use: Never used  Substance Use Topics  . Alcohol use: No    Alcohol/week: 0.0 standard drinks  . Drug use: No    Current Outpatient Medications  Medication Sig Dispense Refill  . albuterol (PROVENTIL) (2.5 MG/3ML) 0.083% nebulizer solution Take 2.5 mg by nebulization every 6 (six) hours as needed for wheezing or shortness of breath.     Marland Kitchen amLODipine (NORVASC) 10 MG tablet Take 10 mg by mouth daily.  3  . atorvastatin (LIPITOR) 80 MG tablet Take 80 mg by mouth every other day. AT NIGHT    . carvedilol (COREG) 3.125 MG tablet Take 1 tablet (3.125 mg total) by mouth 2 (two) times  daily. 60 tablet 6  . Cholecalciferol (VITAMIN D3) 5000 units CAPS Take 5,000 Units by mouth daily after breakfast.     . lisinopril (PRINIVIL,ZESTRIL) 40 MG tablet Take 1 tablet (40 mg total) by mouth daily. 30 tablet 6   No current facility-administered medications for this visit.    Allergies  Allergen Reactions  . Hydrochlorothiazide Other (See Comments)    Pt states "it made my legs hurt"  . Pravastatin Other (See Comments)    Muscle pain    Review of Systems  Constitutional: Negative for activity change, appetite change and unexpected weight change.  HENT: Negative for trouble swallowing and voice change.   Respiratory: Positive for shortness of breath (heavy exertion) and wheezing (occasional).   Cardiovascular: Negative for chest pain, palpitations and leg swelling.  Gastrointestinal: Negative for abdominal distention and abdominal pain.  Genitourinary: Positive for frequency. Negative for difficulty urinating and dysuria.  Musculoskeletal: Negative for arthralgias and myalgias.  Neurological: Negative for seizures, syncope and weakness.  Hematological: Negative for adenopathy. Does not bruise/bleed easily.  All other systems reviewed and are negative.   BP (!) 142/80   Pulse (!) 54   Temp 97.7 F (36.5 C)   Resp 18   Wt 199 lb 12.8 oz (90.6 kg)   SpO2 97%   BMI 29.51 kg/m  Physical Exam Vitals reviewed.  Constitutional:      General: He is not in acute distress.    Appearance: Normal appearance.  HENT:     Head: Normocephalic and atraumatic.  Eyes:     General: No scleral icterus.    Extraocular Movements: Extraocular movements intact.  Neck:     Vascular: No carotid bruit.  Cardiovascular:     Rate and Rhythm: Normal rate and regular rhythm.     Heart sounds: Normal heart sounds. No murmur heard.  No friction rub. No gallop.   Pulmonary:     Effort: Pulmonary effort is normal. No respiratory distress.     Breath sounds: Normal breath sounds. No  wheezing or rales.  Abdominal:     General: There is no distension.     Palpations: Abdomen is soft.     Tenderness: There is no abdominal tenderness.  Musculoskeletal:     Cervical back: Neck supple.  Lymphadenopathy:     Cervical: No cervical adenopathy.  Skin:    General: Skin is warm and dry.  Neurological:     General: No focal  deficit present.     Mental Status: He is alert and oriented to person, place, and time.     Cranial Nerves: No cranial nerve deficit.     Motor: No weakness.     Gait: Gait normal.    Diagnostic Tests: NUCLEAR MEDICINE PET SKULL BASE TO THIGH  TECHNIQUE: 9.3 mCi F-18 FDG was injected intravenously. Full-ring PET imaging was performed from the skull base to thigh after the radiotracer. CT data was obtained and used for attenuation correction and anatomic localization.  Fasting blood glucose: 81 mg/dl  COMPARISON:  None.  FINDINGS: Mediastinal blood pool activity: SUV max 2.75  Liver activity: SUV max NA  NECK: No hypermetabolic lymph nodes in the neck.  Incidental CT findings: none  CHEST: No right upper lobe central perihilar nodule measures 2.9 x 1.5 cm with SUV max of 14.51. Anteromedial right upper lobe distal airway enlargement appears FDG avid with SUV max of 13.6 concerning for endobronchial spread of tumor. Multiple tiny nodules are identified within the periphery of the anterior right upper lobe as well as the anterior basal right upper lobe. These are too small to reliably characterize, image 27/8 and image 37/8. Many of these have a tree-in-bud configuration reflecting inflammatory/infectious bronchiolitis.  Within the posterior right lower lobe there is a tiny 4 mm nodule, image 49/8. This is too small to reliably characterize. No FDG avid mediastinal, left hilar, or subcarinal lymph nodes. No FDG avid axillary or supraclavicular lymph nodes.  Incidental CT findings: Aortic atherosclerosis. Coronary  artery calcifications. No pericardial effusion. Emphysema.  ABDOMEN/PELVIS: No abnormal FDG uptake within the liver, pancreas, or spleen. Right adrenal gland adenoma measures 1.8 cm. Small low-attenuation nodules within the right adrenal gland are identified with mild FDG uptake. Index left adrenal nodule measures 1.4 x 1.8 cm and -4.55 Hounsfield units compatible with a benign adenoma. Low level FDG uptake within this nodule has an SUV max of 4.3. Smaller adenoma measures 1.2 cm and -3.46 Hounsfield units. This has an SUV max of 4.53. Increased uptake at the level of the GE junction is noted without corresponding soft tissue mass. SUV max is equal to 5.78. No FDG avid abdominopelvic lymph nodes.  Incidental CT findings: Signs of previous abdominal aortic aneurysm repair. Aortic atherosclerosis. 3.1 cm right kidney cyst.  SKELETON: No focal hypermetabolic activity to suggest skeletal metastasis.  Incidental CT findings: none  IMPRESSION: 1. Right upper lobe central perihilar lesion is intensely hypermetabolic compatible with primary bronchogenic carcinoma. Signs of endobronchial spread of tumor is identified with FDG avid distended distal airways noted within the anteromedial right upper lobe. 2. No signs of FDG avid mediastinal or contralateral hilar nodal metastasis or distant metastatic disease. 3. Multiple tiny peripheral nodules are identified within the right upper lobe. Many of these have a tree-in-bud configuration and may represent areas of bronchiolitis. Small foci metastasis not excluded. 4. Bilateral adrenal gland adenomas. 5. Aortic Atherosclerosis (ICD10-I70.0) and Emphysema (ICD10-J43.9). Coronary artery calcifications.   Electronically Signed   By: Kerby Moors M.D.   On: 06/28/2020 10:39 I personally reviewed the PET/CT images and concur with the findings noted above  Impression: Nicolas Butler is a 69 yo man with past history of hypertension,  hyperlipidemia, stage III CKD, AAA repair in 2018 and remote tobacco abuse. 70 pack year history of smoking, quit 7 years ago.  He had a low dose screening CT and was found to have a right upper lobe lung mass. Hypermetabolic on PET/ CT. Bronchoscopy with biopsy  revealed a poorly differentiated carcinoma. Clinical stage IA (T1N0).  He has a good functional status and should be able to tolerate a lobectomy without issue. Does need PFTs to confirm adequate pulmonary reserve but more of a formality given his exercise tolerance.  We discussed the treatment options of surgery and radiation and relative advantages and disadvantages of each. I recommended we proceed with robotic right upper lobectomy.  I discussed the general nature of the procedure, the need for general anesthesia, the incisions to be used, and the use of a drainage postoperatively with Nicolas Butler.  We discussed the expected hospital stay, overall recovery and short and long term outcomes. He understands the risks include, but are not limited to death, stroke, MI, DVT/PE, bleeding, possible need for transfusion, infections, prolonged air leaks, cardiac arrhythmias, conversion to thoracotomy, as well as other organ system dysfunction including respiratory, renal, or GI complications.   He accepts the risks and agrees to proceed.  Plan: PFTs Robotic right upper lobectomy on Monday 11/15  Melrose Nakayama, MD [facility] [phone]

## 2020-07-11 NOTE — Progress Notes (Signed)
[place] [phone]   [phone] Multidisciplinary thoracic oncology clinic  CONSULT NOTE  REFERRING PHYSICIAN: Dr. Lenice Llamas  REASON FOR CONSULTATION:  69 years old white male recently diagnosed with lung cancer.  HPI Nicolas Butler is a 69 y.o. male with past medical history significant for abdominal aortic aneurysm status post repair, chronic kidney disease, hypertension, dyslipidemia as well as long history of smoking but quit 7 years ago.  The patient was seen by his primary care physician for routine evaluation and because of his smoking history he recommended for him to have CT screening of the chest which was performed on 05/24/2020 at [facility].  The scan showed right upper lobe 2.4 x 1.4 cm nodule suspicious for malignancy.  He was referred to Dr. Shearon Stalls and she ordered a PET scan which was performed on June 27, 2020 and it showed 2.9 x 1.5 cm right upper lobe nodule suspicious for malignancy.  There was no evidence for metastatic disease to the hilar or mediastinal lymph nodes and no evidence of distant metastasis.  There was 0.4 cm right lower lobe nodule.  The patient underwent bronchoscopy with biopsy of the right upper lobe lung nodule under the care of Dr. Shearon Stalls.  The final pathology [id number]) showed poorly differentiated carcinoma with necrosis. Immunohistochemistry is positive for cytokeratin 5/6. The cells are negative for CD56 and TTF-1. 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 Dr. Shearon Stalls kindly referred the patient to the multidisciplinary thoracic oncology clinic today for evaluation and recommendation regarding treatment of his condition. When seen today he is feeling fine with no concerning complaints except for shortness of breath with exertion but no significant chest pain, cough or hemoptysis.  He denied having any nausea, vomiting, diarrhea or constipation.  He denied having any recent  weight loss or night sweats.  He has no headache or visual changes. Family history significant for mother with COPD, father died from brain tumor and brother had metastatic cancer likely primary lung. The patient is single and has 2 children he was accompanied today by his daughter Nicolas Butler.  He used to work in a [employer].  He has a history of smoking 1.5 pack/day for over 50 years and quit 7 years ago.  He also quit alcohol drinking in 1997 and no history of drug abuse. HPI  Past Medical History:  Diagnosis Date  . AAA (abdominal aortic aneurysm) ([place])   . Chronic kidney disease (CKD), stage III (moderate) (HCC)   . Complication of anesthesia    Bowel and bladder are slow to wake up after surgery  . High cholesterol   . Unspecified essential hypertension     Past Surgical History:  Procedure Laterality Date  . ABDOMINAL AORTIC ANEURYSM REPAIR N/A 06/28/2017   Procedure: ANEURYSM ABDOMINAL AORTIC REPAIR OPEN;  Surgeon: Elam Dutch, MD;  Location: [place];  Service: Vascular;  Laterality: N/A;  . BRONCHIAL BRUSHINGS  07/02/2020   Procedure: BRONCHIAL BRUSHINGS;  Surgeon: Spero Geralds, MD;  Location: [facility];  Service: Pulmonary;;  . BRONCHIAL WASHINGS  07/02/2020   Procedure: BRONCHIAL WASHINGS;  Surgeon: Spero Geralds, MD;  Location: [facility];  Service: Pulmonary;;  BAL and washings  . HAND SURGERY Left   . INCISIONAL HERNIA REPAIR     LEFT  . IRRIGATION AND DEBRIDEMENT SEBACEOUS CYST     FROM RIGHT SHOULDER  . LUNG BIOPSY  07/02/2020   Procedure: LUNG BIOPSY;  Surgeon: Shearon Stalls,  Senaida Ores, MD;  Location: [facility];  Service: Pulmonary;;  . UMBILICAL HERNIA REPAIR    . VIDEO BRONCHOSCOPY Right 07/02/2020   Procedure: VIDEO BRONCHOSCOPY WITHOUT FLUORO;  Surgeon: Spero Geralds, MD;  Location: [facility];  Service: Pulmonary;  Laterality: Right;    Family History  Problem Relation Age of Onset  . Pneumonia Mother   . Other Father        brain tumor  .  Diabetes Son   . Lung cancer Neg Hx     Social History Social History   Tobacco Use  . Smoking status: Former Smoker    Packs/day: 1.50    Years: 46.00    Pack years: 69.00    Types: Cigarettes    Start date: 05/17/1965    Quit date: 12/08/2012    Years since quitting: 7.5  . Smokeless tobacco: Never Used  Vaping Use  . Vaping Use: Never used  Substance Use Topics  . Alcohol use: No    Alcohol/week: 0.0 standard drinks  . Drug use: No    Allergies  Allergen Reactions  . Hydrochlorothiazide Other (See Comments)    Pt states "it made my legs hurt"  . Pravastatin Other (See Comments)    Muscle pain    Current Outpatient Medications  Medication Sig Dispense Refill  . albuterol (PROVENTIL) (2.5 MG/3ML) 0.083% nebulizer solution Take 2.5 mg by nebulization every 6 (six) hours as needed for wheezing or shortness of breath.     Marland Kitchen amLODipine (NORVASC) 10 MG tablet Take 10 mg by mouth daily.  3  . atorvastatin (LIPITOR) 80 MG tablet Take 80 mg by mouth every other day. AT NIGHT    . carvedilol (COREG) 3.125 MG tablet Take 1 tablet (3.125 mg total) by mouth 2 (two) times daily. 60 tablet 6  . Cholecalciferol (VITAMIN D3) 5000 units CAPS Take 5,000 Units by mouth daily after breakfast.     . lisinopril (PRINIVIL,ZESTRIL) 40 MG tablet Take 1 tablet (40 mg total) by mouth daily. 30 tablet 6   No current facility-administered medications for this visit.    Review of Systems  Constitutional: negative Eyes: negative Ears, nose, mouth, throat, and face: negative Respiratory: positive for dyspnea on exertion Cardiovascular: negative Gastrointestinal: negative Genitourinary:negative Integument/breast: negative Hematologic/lymphatic: negative Musculoskeletal:negative Neurological: negative Behavioral/Psych: negative Endocrine: negative Allergic/Immunologic: negative  Physical Exam  RFX:JOITG, healthy, no distress, well nourished and well developed SKIN: skin color, texture,  turgor are normal, no rashes or significant lesions HEAD: Normocephalic, No masses, lesions, tenderness or abnormalities EYES: normal, PERRLA, Conjunctiva are pink and non-injected EARS: External ears normal, Canals clear OROPHARYNX:no exudate, no erythema and lips, buccal mucosa, and tongue normal  NECK: supple, no adenopathy, no JVD LYMPH:  no palpable lymphadenopathy, no hepatosplenomegaly LUNGS: clear to auscultation , and palpation HEART: regular rate & rhythm, no murmurs and no gallops ABDOMEN:abdomen soft, non-tender, normal bowel sounds and no masses or organomegaly BACK: No CVA tenderness, Range of motion is normal EXTREMITIES:no joint deformities, effusion, or inflammation, no edema  NEURO: alert & oriented x 3 with fluent speech, no focal motor/sensory deficits  PERFORMANCE STATUS: ECOG 1  LABORATORY DATA: Lab Results  Component Value Date   WBC 7.3 07/11/2020   HGB 15.8 07/11/2020   HCT 45.8 07/11/2020   MCV 95.4 07/11/2020   PLT 197 07/11/2020      Chemistry      Component Value Date/Time   NA 133 (L) 07/03/2017 0310   K 4.4 07/03/2017 0310  CL 103 07/03/2017 0310   CO2 25 07/03/2017 0310   BUN 20 07/03/2017 0310   CREATININE 1.42 (H) 07/03/2017 0310      Component Value Date/Time   CALCIUM 8.2 (L) 07/03/2017 0310   ALKPHOS 58 06/29/2017 0441   AST 23 06/29/2017 0441   ALT 18 06/29/2017 0441   BILITOT 0.8 06/29/2017 0441       RADIOGRAPHIC STUDIES: NM PET Image Initial (PI) Skull Base To Thigh  Result Date: 06/28/2020 CLINICAL DATA:  Initial treatment strategy for lung nodule. EXAM: NUCLEAR MEDICINE PET SKULL BASE TO THIGH TECHNIQUE: 9.3 mCi F-18 FDG was injected intravenously. Full-ring PET imaging was performed from the skull base to thigh after the radiotracer. CT data was obtained and used for attenuation correction and anatomic localization. Fasting blood glucose: 81 mg/dl COMPARISON:  None. FINDINGS: Mediastinal blood pool activity: SUV max 2.75  Liver activity: SUV max NA NECK: No hypermetabolic lymph nodes in the neck. Incidental CT findings: none CHEST: No right upper lobe central perihilar nodule measures 2.9 x 1.5 cm with SUV max of 14.51. Anteromedial right upper lobe distal airway enlargement appears FDG avid with SUV max of 13.6 concerning for endobronchial spread of tumor. Multiple tiny nodules are identified within the periphery of the anterior right upper lobe as well as the anterior basal right upper lobe. These are too small to reliably characterize, image 27/8 and image 37/8. Many of these have a tree-in-bud configuration reflecting inflammatory/infectious bronchiolitis. Within the posterior right lower lobe there is a tiny 4 mm nodule, image 49/8. This is too small to reliably characterize. No FDG avid mediastinal, left hilar, or subcarinal lymph nodes. No FDG avid axillary or supraclavicular lymph nodes. Incidental CT findings: Aortic atherosclerosis. Coronary artery calcifications. No pericardial effusion. Emphysema. ABDOMEN/PELVIS: No abnormal FDG uptake within the liver, pancreas, or spleen. Right adrenal gland adenoma measures 1.8 cm. Small low-attenuation nodules within the right adrenal gland are identified with mild FDG uptake. Index left adrenal nodule measures 1.4 x 1.8 cm and -4.55 Hounsfield units compatible with a benign adenoma. Low level FDG uptake within this nodule has an SUV max of 4.3. Smaller adenoma measures 1.2 cm and -3.46 Hounsfield units. This has an SUV max of 4.53. Increased uptake at the level of the GE junction is noted without corresponding soft tissue mass. SUV max is equal to 5.78. No FDG avid abdominopelvic lymph nodes. Incidental CT findings: Signs of previous abdominal aortic aneurysm repair. Aortic atherosclerosis. 3.1 cm right kidney cyst. SKELETON: No focal hypermetabolic activity to suggest skeletal metastasis. Incidental CT findings: none IMPRESSION: 1. Right upper lobe central perihilar lesion is  intensely hypermetabolic compatible with primary bronchogenic carcinoma. Signs of endobronchial spread of tumor is identified with FDG avid distended distal airways noted within the anteromedial right upper lobe. 2. No signs of FDG avid mediastinal or contralateral hilar nodal metastasis or distant metastatic disease. 3. Multiple tiny peripheral nodules are identified within the right upper lobe. Many of these have a tree-in-bud configuration and may represent areas of bronchiolitis. Small foci metastasis not excluded. 4. Bilateral adrenal gland adenomas. 5. Aortic Atherosclerosis (ICD10-I70.0) and Emphysema (ICD10-J43.9). Coronary artery calcifications. Electronically Signed   By: Kerby Moors M.D.   On: 06/28/2020 10:39   DG CHEST PORT 1 VIEW  Result Date: 07/02/2020 CLINICAL DATA:  Status post bronchoscopy EXAM: PORTABLE CHEST 1 VIEW COMPARISON:  Chest radiograph June 29, 2017; PET-CT June 27, 2020 FINDINGS: No pneumothorax. Soft tissue prominence is noted in the superior right hilar  region at the site of metabolic active lesion seen on PET study. No edema or airspace opacity. Heart size and pulmonary vascularity are normal. There is aortic atherosclerosis. No bone lesions. IMPRESSION: No pneumothorax. Mass in the right suprahilar region, better seen on recent PET-CT examination. No edema or airspace opacity. Cardiac silhouette within normal limits. Aortic Atherosclerosis (ICD10-I70.0). Electronically Signed   By: Lowella Grip III M.D.   On: 07/02/2020 15:34    ASSESSMENT: This is a very pleasant 69 years old white male recently diagnosed with a stage Ia (T1c, N0, M0) non-small cell lung cancer, squamous cell carcinoma presented with right upper lobe lung nodule measuring 2.9 cm in size with no evidence of metastatic disease to the lymph nodes or extrathoracic metastasis diagnosed in October 2021.   PLAN: I had a lengthy discussion with the patient and his daughter today about his current  disease stage, prognosis and treatment options. I personally and independently reviewed the scan images and discussed the result and showed the images to the patient and his daughter. I recommended for the patient evaluation by cardiothoracic surgery for consideration of surgical resection. If the patient is not a good surgical candidate for resection, he may benefit from evaluation by radiation oncology for SBRT. I will arrange a follow-up visit and imaging studies for this patient based on the final decision regarding his treatment with surgery or radiation and also based on the final pathology and tumor size. The patient was advised to call immediately if he has any other concerning symptoms in the interval. The patient voices understanding of current disease status and treatment options and is in agreement with the current care plan.  All questions were answered. The patient knows to call the clinic with any problems, questions or concerns. We can certainly see the patient much sooner if necessary.  Thank you so much for allowing me to participate in the care of Nicolas Butler. I will continue to follow up the patient with you and assist in his care.  The total time spent in the appointment was 60 minutes.  Disclaimer: This note was dictated with voice recognition software. Similar sounding words can inadvertently be transcribed and may not be corrected upon review.   Eilleen Kempf July 11, 2020, 1:48 PM

## 2020-07-12 ENCOUNTER — Encounter: Payer: Self-pay | Admitting: *Deleted

## 2020-07-18 ENCOUNTER — Other Ambulatory Visit: Payer: Self-pay

## 2020-07-18 ENCOUNTER — Encounter (HOSPITAL_COMMUNITY)
Admission: RE | Admit: 2020-07-18 | Discharge: 2020-07-18 | Disposition: A | Discharge status: Home / Self Care | Payer: PPO | Source: Ambulatory Visit | Attending: Thoracic Surgery (Cardiothoracic Vascular Surgery) | Admitting: Thoracic Surgery (Cardiothoracic Vascular Surgery)

## 2020-07-18 ENCOUNTER — Encounter (HOSPITAL_COMMUNITY): Payer: Self-pay

## 2020-07-18 ENCOUNTER — Other Ambulatory Visit (HOSPITAL_COMMUNITY)
Admission: RE | Admit: 2020-07-18 | Discharge: 2020-07-18 | Disposition: A | Discharge status: Home / Self Care | Payer: PPO | Source: Ambulatory Visit | Attending: Thoracic Surgery (Cardiothoracic Vascular Surgery) | Admitting: Thoracic Surgery (Cardiothoracic Vascular Surgery)

## 2020-07-18 DIAGNOSIS — Z01818 Encounter for other preprocedural examination: Secondary | ICD-10-CM | POA: Diagnosis not present

## 2020-07-18 DIAGNOSIS — R918 Other nonspecific abnormal finding of lung field: Secondary | ICD-10-CM

## 2020-07-18 DIAGNOSIS — Z20822 Contact with and (suspected) exposure to covid-19: Secondary | ICD-10-CM | POA: Diagnosis not present

## 2020-07-18 HISTORY — DX: Heart failure, unspecified: I50.9

## 2020-07-18 HISTORY — DX: Dyspnea, unspecified: R06.00

## 2020-07-18 HISTORY — DX: Atherosclerotic heart disease of native coronary artery without angina pectoris: I25.10

## 2020-07-18 LAB — TYPE AND SCREEN
ABO/RH(D): O POS
Antibody Screen: NEGATIVE

## 2020-07-18 LAB — CBC
HCT: 46.6 % (ref 39.0–52.0)
Hemoglobin: 15.9 g/dL (ref 13.0–17.0)
MCH: 33.3 pg (ref 26.0–34.0)
MCHC: 34.1 g/dL (ref 30.0–36.0)
MCV: 97.5 fL (ref 80.0–100.0)
Platelets: 207 10*3/uL (ref 150–400)
RBC: 4.78 MIL/uL (ref 4.22–5.81)
RDW: 12.8 % (ref 11.5–15.5)
WBC: 7.7 10*3/uL (ref 4.0–10.5)
nRBC: 0 % (ref 0.0–0.2)

## 2020-07-18 LAB — URINALYSIS, ROUTINE W REFLEX MICROSCOPIC
Bilirubin Urine: NEGATIVE
Glucose, UA: NEGATIVE mg/dL
Hgb urine dipstick: NEGATIVE
Ketones, ur: NEGATIVE mg/dL
Leukocytes,Ua: NEGATIVE
Nitrite: NEGATIVE
Protein, ur: NEGATIVE mg/dL
Specific Gravity, Urine: 1.012 (ref 1.005–1.030)
pH: 7 (ref 5.0–8.0)

## 2020-07-18 LAB — COMPREHENSIVE METABOLIC PANEL
ALT: 19 U/L (ref 0–44)
AST: 18 U/L (ref 15–41)
Albumin: 4.1 g/dL (ref 3.5–5.0)
Alkaline Phosphatase: 77 U/L (ref 38–126)
Anion gap: 8 (ref 5–15)
BUN: 17 mg/dL (ref 8–23)
CO2: 23 mmol/L (ref 22–32)
Calcium: 9.3 mg/dL (ref 8.9–10.3)
Chloride: 106 mmol/L (ref 98–111)
Creatinine, Ser: 1.41 mg/dL — ABNORMAL HIGH (ref 0.61–1.24)
GFR, Estimated: 54 mL/min — ABNORMAL LOW (ref >60)
Glucose, Bld: 84 mg/dL (ref 70–99)
Potassium: 4.3 mmol/L (ref 3.5–5.1)
Sodium: 137 mmol/L (ref 135–145)
Total Bilirubin: 0.9 mg/dL (ref 0.3–1.2)
Total Protein: 7.8 g/dL (ref 6.5–8.1)

## 2020-07-18 LAB — SARS CORONAVIRUS 2 (TAT 6-24 HRS): SARS Coronavirus 2: NEGATIVE

## 2020-07-18 LAB — APTT: aPTT: 34 seconds (ref 24–36)

## 2020-07-18 LAB — BLOOD GAS, ARTERIAL
Acid-Base Excess: 1.5 mmol/L (ref 0.0–2.0)
Bicarbonate: 25.7 mmol/L (ref 20.0–28.0)
Drawn by: 58793
FIO2: 21
O2 Saturation: 95.4 %
Patient temperature: 37
pCO2 arterial: 41.8 mmHg (ref 32.0–48.0)
pH, Arterial: 7.405 (ref 7.350–7.450)
pO2, Arterial: 77.9 mmHg — ABNORMAL LOW (ref 83.0–108.0)

## 2020-07-18 LAB — PROTIME-INR
INR: 1 (ref 0.8–1.2)
Prothrombin Time: 12.9 seconds (ref 11.4–15.2)

## 2020-07-18 LAB — SURGICAL PCR SCREEN
MRSA, PCR: NEGATIVE
Staphylococcus aureus: NEGATIVE

## 2020-07-18 NOTE — Progress Notes (Signed)
[id number] - [place], [place] - [place] [place] [address] Phone: [phone] Fax: [phone]      Your procedure is scheduled on 07-22-2020.  Report to [facility] Main Entrance "A" at 5:30 A.M., and check in at the Admitting office.  Call this number if you have problems the morning of surgery:  [phone]  Call [phone] if you have any questions prior to your surgery date Monday-Friday 8am-4pm    Remember:  Do not eat or drink after midnight the night before your surgery      Take these medicines the morning of surgery with A SIP OF WATER  albuterol (PROVENTIL) nebulizer (if needed) amLODipine (NORVASC) carvedilol (COREG)    As of today, STOP taking any Aspirin (unless otherwise instructed by your surgeon) Aleve, Naproxen, Ibuprofen, Motrin, Advil, Goody's, BC's, all herbal medications, fish oil, and all vitamins.                      Do not wear jewelry.            Do not wear lotions, powders, perfumes/colognes, or deodorant.            Do not shave 48 hours prior to surgery.  Men may shave face and neck.            Do not bring valuables to the hospital.            [facility] is not responsible for any belongings or valuables.  Do NOT Smoke (Tobacco/Vaping) or drink Alcohol 24 hours prior to your procedure If you use a CPAP at night, you may bring all equipment for your overnight stay.   Contacts, glasses, dentures or bridgework may not be worn into surgery.      For patients admitted to the hospital, discharge time will be determined by your treatment team.   Patients discharged the day of surgery will not be allowed to drive home, and someone needs to stay with them for 24 hours.    Special instructions:   [facility]- Preparing For Surgery  Before surgery, you can play an important role. Because skin is not sterile, your skin needs to be as free of germs as possible. You can reduce the number of germs on  your skin by washing with CHG (chlorahexidine gluconate) Soap before surgery.  CHG is an antiseptic cleaner which kills germs and bonds with the skin to continue killing germs even after washing.    Oral Hygiene is also important to reduce your risk of infection.  Remember - BRUSH YOUR TEETH THE MORNING OF SURGERY WITH YOUR REGULAR TOOTHPASTE  Please do not use if you have an allergy to CHG or antibacterial soaps. If your skin becomes reddened/irritated stop using the CHG.  Do not shave (including legs and underarms) for at least 48 hours prior to first CHG shower. It is OK to shave your face.  Please follow these instructions carefully.   1. Shower the NIGHT BEFORE SURGERY and the MORNING OF SURGERY with CHG Soap.   2. If you chose to wash your hair, wash your hair first as usual with your normal shampoo.  3. After you shampoo, rinse your hair and body thoroughly to remove the shampoo.  4. Use CHG as you would any other liquid soap. You can apply CHG directly to the skin and wash gently with a scrungie or a clean washcloth.   5. Apply the CHG Soap to  your body ONLY FROM THE NECK DOWN.  Do not use on open wounds or open sores. Avoid contact with your eyes, ears, mouth and genitals (private parts). Wash Face and genitals (private parts)  with your normal soap.   6. Wash thoroughly, paying special attention to the area where your surgery will be performed.  7. Thoroughly rinse your body with warm water from the neck down.  8. DO NOT shower/wash with your normal soap after using and rinsing off the CHG Soap.  9. Pat yourself dry with a CLEAN TOWEL.  10. Wear CLEAN PAJAMAS to bed the night before surgery  11. Place CLEAN SHEETS on your bed the night of your first shower and DO NOT SLEEP WITH PETS.   Day of Surgery: Wear Clean/Comfortable clothing the morning of surgery Do not apply any deodorants/lotions.   Remember to brush your teeth WITH YOUR REGULAR TOOTHPASTE.   Please read over  the following fact sheets that you were given.

## 2020-07-18 NOTE — Progress Notes (Addendum)
PCP: Vernon Prey, PA Cardiologist: Dr. Ursula Beath saw 10/2018  EKG: Today CXR: DOS ECHO: 05-02-2017 Stress Test: 03-26-2017 Cardiac Cath: denies  Had Covid test at [facility] today  Patient denies shortness of breath, fever, cough, and chest pain at PAT appointment.  Patient verbalized understanding of instructions provided today at the PAT appointment.  Patient asked to review instructions at home and day of surgery.   AddendumEbony Hail PA-C with anesthesia called after EKG results.  HR on EKG 47--pt reports he normally has a low pulse rate. Denies any symptoms of CP, SOB, dizziness. Ebony Hail to come to speak with patient.

## 2020-07-18 NOTE — Anesthesia Preprocedure Evaluation (Addendum)
Anesthesia Evaluation  Patient identified by MRN, date of birth, ID band Patient awake    Reviewed: Patient's Chart, lab work & pertinent test results  Airway Mallampati: II  TM Distance: >3 FB Neck ROM: Full    Dental  (+) Poor Dentition, Missing   Pulmonary COPD, former smoker,  RUL carcinoma   Pulmonary exam normal        Cardiovascular hypertension, Pt. on medications and Pt. on home beta blockers + CAD and +CHF   Rhythm:Regular Rate:Normal  AAA   Neuro/Psych    GI/Hepatic negative GI ROS, Neg liver ROS,   Endo/Other  negative endocrine ROS  Renal/GU CRFRenal disease  negative genitourinary   Musculoskeletal negative musculoskeletal ROS (+)   Abdominal (+)  Abdomen: soft. Bowel sounds: normal.  Peds  Hematology  (+) anemia ,   Anesthesia Other Findings   Reproductive/Obstetrics                           Anesthesia Physical Anesthesia Plan  ASA: III  Anesthesia Plan: General   Post-op Pain Management:    Induction: Intravenous  PONV Risk Score and Plan: 2 and Ondansetron, Dexamethasone and Treatment may vary due to age or medical condition  Airway Management Planned: Mask and Double Lumen EBT  Additional Equipment: Arterial line  Intra-op Plan:   Post-operative Plan: Extubation in OR  Informed Consent: I have reviewed the patients History and Physical, chart, labs and discussed the procedure including the risks, benefits and alternatives for the proposed anesthesia with the patient or authorized representative who has indicated his/her understanding and acceptance.     Dental advisory given  Plan Discussed with: CRNA  Anesthesia Plan Comments: (PAT note written by Myra Gianotti, PA-C. PFTs 07/19/20: FVC-Pre L 3.43  FVC-%Pred-Pre % 79  FVC-Post L 3.93  FVC-%Pred-Post % 91  FVC-%Change-Post % 14  FEV1-Pre L 1.79  FEV1-%Pred-Pre % 56  FEV1-Post L 2.05   FEV1-%Pred-Post % 64  FEV1-%Change-Post % 14  FEV6-Pre L 3.20  FEV6-%Pred-Pre % 78  FEV6-Post L 3.50  FEV6-%Pred-Post % 86  FEV6-%Change-Post % 9  Pre FEV1/FVC ratio % 52  FEV1FVC-%Pred-Pre % 70  Post FEV1/FVC ratio % 52  FEV1FVC-%Change-Post % 0  Pre FEV6/FVC Ratio % 93  FEV6FVC-%Pred-Pre % 98  Post FEV6/FVC ratio % 89  FEV6FVC-%Pred-Post % 94  FEV6FVC-%Change-Post % -4  FEF 25-75 Pre L/sec 0.79  FEF2575-%Pred-Pre % 32  FEF 25-75 Post L/sec 1.10  FEF2575-%Pred-Post % 45  FEF2575-%Change-Post % 38  RV L 5.69  RV % pred % 239  TLC L 9.14  TLC % pred % 133  DLCO unc ml/min/mmHg 23.12  DLCO unc % pred % 91  DLCO cor ml/min/mmHg 22.34  DLCO cor % pred % 88  DL/VA ml/min/mmHg/L 4.13  DL/VA % pred % 101   Echo 04/29/17: Study Conclusions  - Left ventricle: The cavity size was normal. Wall thickness was  increased in a pattern of moderate LVH. Systolic function was  normal. The estimated ejection fraction was in the range of 60%  to 65%. Wall motion was normal; there were no regional wall  motion abnormalities. Doppler parameters are consistent with  abnormal left ventricular relaxation (grade 1 diastolic  dysfunction).  - Aortic valve: Valve area (VTI): 3.52 cm^2. Valve area (Vmax):  3.11 cm^2. Valve area (Vmean): 3.12 cm^2.  - Aorta: Aortic root dimension: 40 mm (ED). Ascending aortic  diameter: 40 mm (S).  - Aortic root: The aortic  root was mildly dilated.  - Left atrium: The atrium was moderately dilated.  - Technically adequate study.  (Comparison 02/09/14: LVEF 25-42% , diastolic function, basal anterior lateral, mid anterolateral, and apical lateral wall segments are akinetic)  Nuclear stress test 03/26/17:  Nuclear stress EF: 61%.  There was no ST segment deviation noted during stress.  Defect 1: There is a defect present in the basal inferior, basal inferolateral, mid inferior, mid inferolateral and apical inferior location.  Findings  consistent with ischemia.  This is an intermediate risk study.  The left ventricular ejection fraction is normal (55-65%). There is a large area, moderate severity defect in the basal and mid inferolateral, inferior and apical inferior walls (SDS=6) consistent with ischemia in the LCX territory.  (Comparison Lexiscan MPI 02/20/14: LVEF 40%, moderate sized mild intensity inferoapical and inferolateral infarct, fairly mild myocardium at jeopardy))      Anesthesia Quick Evaluation

## 2020-07-18 NOTE — Progress Notes (Signed)
Anesthesia APP Evaluation:  Case: [id number] Date/Time: 07/22/20 0715   Procedure: XI ROBOTIC ASSISTED THORASCOPY- RIGHT UPPER LOBECTOMY (Right Chest)   Anesthesia type: General   Pre-op diagnosis: RUL CARCINOMA   Location: [facility] OR ROOM 10 / [place] [facility]   Surgeons: Melrose Nakayama, MD      DISCUSSION: Patient is a 69 year old male scheduled for the above procedure. He was found to have a RUL lung lesion on September Chest CT for lung cancer screening. 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  History includes former smoker (69 pack years, quit 12/08/12), HTN, AAA (s/p open repair AAA 06/28/17), CKD (stage III), hypercholesterolemia, exertional dyspnea (stable), chronic systolic CHF (diagnosed 05/5620), presumed CAD (inferolateral infarct on 2015, 2018 stress test, medical management), ventral hernia repair (2019). 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   Last visit with cardiologist Dr. Harl Bowie was on 05/08/19 for follow-up chronic systolic CHF diagnosed in 02/2014 when he presented to [facility] (now [facility] with BP 241/142. LVEF 40-45% with inferior wall motion abnormalities. Stress test suggestive of prior infarct. 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 Patient has been treated medically since then. He says he has taken pork out of his diet and exercises regularly. He has not smoked since 2014 and is compliant with his medications. His last cardiac testing was in 2018 prior to undergoing open AAA repair. Repeat echo showed improved LVEF 60-65%, normal wall motion, grade 1 diastolic dysfunction. Stress test showed inferior defects (basal inferior, basal inferolateral, mid inferior, mid inferolateral and apical inferior) and felt consistent with ischemia in the LCX territory, intermediate risk. Again, Dr. Harl Bowie recommended medical  therapy as patient asymptomatic with underlying CKD. Mr. Royce had regular follow-up with Dr. Harl Bowie until 04/2019, but declined rescheduling follow-up in 2021 given no changes and regular primary care follow-up. He continues to walks about 1 1/2 - 2 miles nearly everyday. He reports walks are at a steady past and consists of some mild inclines. He has mild DOE with inclines but no recent changes. He was able to mow (push and riding lawnmower) and rake leaves last week. He does push ups regularly (20 on floor or 55 using his dresser). He does not get chest pain with these activities. Also reports known bradycardia, asymptomatic. No syncope, palpitations, orthopnea. No SOB at rest. No recent use of albuterol. Meds include statin, b-blocker, ACE inhibitor.   Dr. Roxan Hockey classified Zubrod Score: _0 ?    1    Restricted in physical strenuous activity but ambulatory, able to do out light work  07/18/20 COVID-19 test negative. PFTs are scheduled for 07/19/20. Reviewed above with anesthesiologist Annye Asa, MD. Anesthesia team to evaluate on the day of surgery.   VS: BP 133/72   Pulse (!) 54   Temp 36.9 C (Oral)   Resp 18   Ht _1  (1.753 m)   Wt 90.2 kg   SpO2 96%   BMI 29.37 kg/m   Heart regular rhythm, HR 54. Lungs clear. No significant pretibial edema. No carotid bruits noted.    PROVIDERS: Lavella Lemons, PA is PCP ([facility]) Lenice Llamas, MD is pulmonologist Curt Bears, MD is Baker Pierini, MD is [facility] [name], MD is vascular surgeon Carlyle Dolly, MD is cardiologist. Last visit 05/08/19. Fran Lowes, MD is nephrologist ([place]). Last visit 03/22/18 for acute on chronic kidney disease following hernia repair. Patient reported Creatinine had  been > 5, but recovered to his baseline (~ 1.4-1.6). Patient is no longer following with nephrology.    LABS: Labs reviewed: Acceptable for surgery. (all labs ordered are  listed, but only abnormal results are displayed)  Labs Reviewed  BLOOD GAS, ARTERIAL - Abnormal; Notable for the following components:      Result Value   pO2, Arterial 77.9 (*)    All other components within normal limits  COMPREHENSIVE METABOLIC PANEL - Abnormal; Notable for the following components:   Creatinine, Ser 1.41 (*)    GFR, Estimated 54 (*)    All other components within normal limits  SURGICAL PCR SCREEN  APTT  CBC  PROTIME-INR  URINALYSIS, ROUTINE W REFLEX MICROSCOPIC  TYPE AND SCREEN     IMAGES: For CXR on the day of surgery.   PET Scan 06/27/20: IMPRESSION: 1. Right upper lobe central perihilar lesion is intensely hypermetabolic compatible with primary bronchogenic carcinoma. Signs of endobronchial spread of tumor is identified with FDG avid distended distal airways noted within the anteromedial right upper lobe. 2. No signs of FDG avid mediastinal or contralateral hilar nodal metastasis or distant metastatic disease. 3. Multiple tiny peripheral nodules are identified within the right upper lobe. Many of these have a tree-in-bud configuration and may represent areas of bronchiolitis. Small foci metastasis not excluded. 4. Bilateral adrenal gland adenomas. 5. Aortic Atherosclerosis (ICD10-I70.0) and Emphysema (ICD10-J43.9). Coronary artery calcifications.  CT Lung Cancer Screening 05/24/20 [facility]): 1. Lung-RADS 4BS, suspicious. Additional imaging evaluation or  consultation with Pulmonology or Thoracic Surgery recommended.  2. The "S" modifier above refers to potentially clinically  significant non lung cancer related findings. Specifically, there is  aortic atherosclerosis, in addition to left main and 3 vessel  coronary artery disease. Please note that although the presence of  coronary artery calcium documents the presence of coronary artery  disease, the severity of this disease and any potential stenosis  cannot be assessed on this non-gated CT  examination. Assessment for  potential risk factor modification, dietary therapy or pharmacologic  therapy may be warranted, if clinically indicated.  3. Ectasia of the ascending thoracic aorta (4.3 cm in diameter).  Recommend annual imaging followup by CTA or MRA. This recommendation  follows 2010 ACCF/AHA/AATS/ACR/ASA/SCA/SCAI/SIR/STS/SVM Guidelines  for the Diagnosis and Management of Patients with Thoracic Aortic  Disease. Circulation. 2010; 233:A076-A263. Aortic aneurysm NOS  (ICD10-I71.9).  4. Mild diffuse bronchial wall thickening with very mild  centrilobular and paraseptal emphysema; imaging findings suggestive  of underlying COPD.  - Aortic Atherosclerosis (ICD10-I70.0).     EKG: 07/18/20: Ventricular rate 47 bpm Sinus bradycardia with 1st degree A-V block Left axis deviation Abnormal ECG Confirmed by Ida Rogue [id number]) on 07/18/2020 5:33:01 PM   CV: Echo 04/29/17: Study Conclusions  - Left ventricle: The cavity size was normal. Wall thickness was  increased in a pattern of moderate LVH. Systolic function was  normal. The estimated ejection fraction was in the range of 60%  to 65%. Wall motion was normal; there were no regional wall  motion abnormalities. Doppler parameters are consistent with  abnormal left ventricular relaxation (grade 1 diastolic  dysfunction).  - Aortic valve: Valve area (VTI): 3.52 cm^2. Valve area (Vmax):  3.11 cm^2. Valve area (Vmean): 3.12 cm^2.  - Aorta: Aortic root dimension: 40 mm (ED). Ascending aortic  diameter: 40 mm (S).  - Aortic root: The aortic root was mildly dilated.  - Left atrium: The atrium was moderately dilated.  - Technically adequate study.  (Comparison 02/09/14:  LVEF 50-09% , diastolic function, basal anterior lateral, mid anterolateral, and apical lateral wall segments are akinetic)  Nuclear stress test 03/26/17:  Nuclear stress EF: 61%.  There was no ST segment deviation noted during  stress.  Defect 1: There is a defect present in the basal inferior, basal inferolateral, mid inferior, mid inferolateral and apical inferior location.  Findings consistent with ischemia.  This is an intermediate risk study.  The left ventricular ejection fraction is normal (55-65%). There is a large area, moderate severity defect in the basal and mid inferolateral, inferior and apical inferior walls (SDS=6) consistent with ischemia in the LCX territory.  (Comparison Lexiscan MPI 02/20/14: LVEF 40%, moderate sized mild intensity inferoapical and inferolateral infarct, fairly mild myocardium at jeopardy)   Past Medical History:  Diagnosis Date  . AAA (abdominal aortic aneurysm) ([place])   . CHF (congestive heart failure) ([place])   . Chronic kidney disease (CKD), stage III (moderate) ([facility])   . Complication of anesthesia    Bowel and bladder are slow to wake up after surgery  . Coronary artery disease    presumed  . Dyspnea    exertion  . High cholesterol   . Unspecified essential hypertension     Past Surgical History:  Procedure Laterality Date  . ABDOMINAL AORTIC ANEURYSM REPAIR N/A 06/28/2017   Procedure: ANEURYSM ABDOMINAL AORTIC REPAIR OPEN;  Surgeon: Elam Dutch, MD;  Location: [place];  Service: Vascular;  Laterality: N/A;  . BRONCHIAL BRUSHINGS  07/02/2020   Procedure: BRONCHIAL BRUSHINGS;  Surgeon: Spero Geralds, MD;  Location: [facility];  Service: Pulmonary;;  . BRONCHIAL WASHINGS  07/02/2020   Procedure: BRONCHIAL WASHINGS;  Surgeon: Spero Geralds, MD;  Location: [facility];  Service: Pulmonary;;  BAL and washings  . HAND SURGERY Left   . HERNIA REPAIR    . INCISIONAL HERNIA REPAIR     LEFT  . IRRIGATION AND DEBRIDEMENT SEBACEOUS CYST     FROM RIGHT SHOULDER  . LUNG BIOPSY  07/02/2020   Procedure: LUNG BIOPSY;  Surgeon: Spero Geralds, MD;  Location: [facility];  Service: Pulmonary;;  . UMBILICAL HERNIA REPAIR    . VIDEO BRONCHOSCOPY Right 07/02/2020    Procedure: VIDEO BRONCHOSCOPY WITHOUT FLUORO;  Surgeon: Spero Geralds, MD;  Location: [facility];  Service: Pulmonary;  Laterality: Right;    MEDICATIONS: . albuterol (PROVENTIL) (2.5 MG/3ML) 0.083% nebulizer solution  . amLODipine (NORVASC) 10 MG tablet  . atorvastatin (LIPITOR) 80 MG tablet  . carvedilol (COREG) 3.125 MG tablet  . Cholecalciferol (VITAMIN D3) 5000 units CAPS  . lisinopril (PRINIVIL,ZESTRIL) 40 MG tablet   No current facility-administered medications for this encounter.    Myra Gianotti, PA-C Surgical Short Stay/Anesthesiology [facility] Phone [phone] [facility] Phone [phone] 07/18/2020 6:50 PM

## 2020-07-19 ENCOUNTER — Ambulatory Visit (HOSPITAL_COMMUNITY)
Admission: RE | Admit: 2020-07-19 | Discharge: 2020-07-19 | Disposition: A | Discharge status: Home / Self Care | Payer: PPO | Source: Ambulatory Visit | Attending: Thoracic Surgery (Cardiothoracic Vascular Surgery) | Admitting: Thoracic Surgery (Cardiothoracic Vascular Surgery)

## 2020-07-19 ENCOUNTER — Inpatient Hospital Stay (HOSPITAL_COMMUNITY): Admission: RE | Admit: 2020-07-19 | Payer: PPO | Source: Ambulatory Visit

## 2020-07-19 DIAGNOSIS — J90 Pleural effusion, not elsewhere classified: Secondary | ICD-10-CM | POA: Diagnosis not present

## 2020-07-19 DIAGNOSIS — N183 Chronic kidney disease, stage 3 unspecified: Secondary | ICD-10-CM | POA: Diagnosis not present

## 2020-07-19 DIAGNOSIS — J939 Pneumothorax, unspecified: Secondary | ICD-10-CM | POA: Diagnosis not present

## 2020-07-19 DIAGNOSIS — I498 Other specified cardiac arrhythmias: Secondary | ICD-10-CM | POA: Diagnosis not present

## 2020-07-19 DIAGNOSIS — Z888 Allergy status to other drugs, medicaments and biological substances status: Secondary | ICD-10-CM | POA: Diagnosis not present

## 2020-07-19 DIAGNOSIS — Z01818 Encounter for other preprocedural examination: Secondary | ICD-10-CM | POA: Diagnosis not present

## 2020-07-19 DIAGNOSIS — Z20822 Contact with and (suspected) exposure to covid-19: Secondary | ICD-10-CM | POA: Diagnosis present

## 2020-07-19 DIAGNOSIS — R918 Other nonspecific abnormal finding of lung field: Secondary | ICD-10-CM | POA: Insufficient documentation

## 2020-07-19 DIAGNOSIS — I5022 Chronic systolic (congestive) heart failure: Secondary | ICD-10-CM | POA: Diagnosis present

## 2020-07-19 DIAGNOSIS — D3501 Benign neoplasm of right adrenal gland: Secondary | ICD-10-CM | POA: Diagnosis present

## 2020-07-19 DIAGNOSIS — Z8679 Personal history of other diseases of the circulatory system: Secondary | ICD-10-CM | POA: Diagnosis not present

## 2020-07-19 DIAGNOSIS — R001 Bradycardia, unspecified: Secondary | ICD-10-CM | POA: Diagnosis not present

## 2020-07-19 DIAGNOSIS — Z9889 Other specified postprocedural states: Secondary | ICD-10-CM | POA: Diagnosis not present

## 2020-07-19 DIAGNOSIS — Z4682 Encounter for fitting and adjustment of non-vascular catheter: Secondary | ICD-10-CM | POA: Diagnosis not present

## 2020-07-19 DIAGNOSIS — C3411 Malignant neoplasm of upper lobe, right bronchus or lung: Secondary | ICD-10-CM | POA: Diagnosis present

## 2020-07-19 DIAGNOSIS — N1831 Chronic kidney disease, stage 3a: Secondary | ICD-10-CM | POA: Diagnosis present

## 2020-07-19 DIAGNOSIS — I130 Hypertensive heart and chronic kidney disease with heart failure and stage 1 through stage 4 chronic kidney disease, or unspecified chronic kidney disease: Secondary | ICD-10-CM | POA: Diagnosis present

## 2020-07-19 DIAGNOSIS — J9811 Atelectasis: Secondary | ICD-10-CM | POA: Diagnosis not present

## 2020-07-19 DIAGNOSIS — Z87891 Personal history of nicotine dependence: Secondary | ICD-10-CM | POA: Diagnosis not present

## 2020-07-19 LAB — PULMONARY FUNCTION TEST
DL/VA % pred: 101 %
DL/VA: 4.13 ml/min/mmHg/L
DLCO cor % pred: 88 %
DLCO cor: 22.34 ml/min/mmHg
DLCO unc % pred: 91 %
DLCO unc: 23.12 ml/min/mmHg
FEF 25-75 Post: 1.1 L/sec
FEF 25-75 Pre: 0.79 L/sec
FEF2575-%Change-Post: 38 %
FEF2575-%Pred-Post: 45 %
FEF2575-%Pred-Pre: 32 %
FEV1-%Change-Post: 14 %
FEV1-%Pred-Post: 64 %
FEV1-%Pred-Pre: 56 %
FEV1-Post: 2.05 L
FEV1-Pre: 1.79 L
FEV1FVC-%Change-Post: 0 %
FEV1FVC-%Pred-Pre: 70 %
FEV6-%Change-Post: 9 %
FEV6-%Pred-Post: 86 %
FEV6-%Pred-Pre: 78 %
FEV6-Post: 3.5 L
FEV6-Pre: 3.2 L
FEV6FVC-%Change-Post: -4 %
FEV6FVC-%Pred-Post: 94 %
FEV6FVC-%Pred-Pre: 98 %
FVC-%Change-Post: 14 %
FVC-%Pred-Post: 91 %
FVC-%Pred-Pre: 79 %
FVC-Post: 3.93 L
FVC-Pre: 3.43 L
Post FEV1/FVC ratio: 52 %
Post FEV6/FVC ratio: 89 %
Pre FEV1/FVC ratio: 52 %
Pre FEV6/FVC Ratio: 93 %
RV % pred: 239 %
RV: 5.69 L
TLC % pred: 133 %
TLC: 9.14 L

## 2020-07-19 MED ORDER — ALBUTEROL SULFATE (2.5 MG/3ML) 0.083% IN NEBU
2.5000 mg | INHALATION_SOLUTION | Freq: Once | RESPIRATORY_TRACT | Status: AC
  Administered 2020-07-19: 2.5 mg via RESPIRATORY_TRACT

## 2020-07-22 ENCOUNTER — Inpatient Hospital Stay (HOSPITAL_COMMUNITY): Payer: PPO

## 2020-07-22 ENCOUNTER — Encounter (HOSPITAL_COMMUNITY)
Admission: RE | Disposition: A | Discharge status: Home / Self Care | Payer: Self-pay | Source: Home / Self Care | Attending: Thoracic Surgery (Cardiothoracic Vascular Surgery)

## 2020-07-22 ENCOUNTER — Encounter (HOSPITAL_COMMUNITY): Payer: Self-pay | Admitting: Thoracic Surgery (Cardiothoracic Vascular Surgery)

## 2020-07-22 ENCOUNTER — Inpatient Hospital Stay (HOSPITAL_COMMUNITY)
Admission: RE | Admit: 2020-07-22 | Discharge: 2020-07-27 | DRG: 164 | Disposition: A | Discharge status: Home / Self Care | Payer: PPO | Attending: Thoracic Surgery (Cardiothoracic Vascular Surgery) | Admitting: Thoracic Surgery (Cardiothoracic Vascular Surgery)

## 2020-07-22 ENCOUNTER — Inpatient Hospital Stay (HOSPITAL_COMMUNITY): Payer: PPO | Admitting: Certified Registered"

## 2020-07-22 ENCOUNTER — Other Ambulatory Visit: Payer: Self-pay

## 2020-07-22 ENCOUNTER — Inpatient Hospital Stay (HOSPITAL_COMMUNITY): Payer: PPO | Admitting: Vascular Surgery

## 2020-07-22 ENCOUNTER — Other Ambulatory Visit (HOSPITAL_COMMUNITY): Payer: PPO

## 2020-07-22 DIAGNOSIS — I5022 Chronic systolic (congestive) heart failure: Secondary | ICD-10-CM | POA: Diagnosis present

## 2020-07-22 DIAGNOSIS — Z888 Allergy status to other drugs, medicaments and biological substances status: Secondary | ICD-10-CM | POA: Diagnosis not present

## 2020-07-22 DIAGNOSIS — Z20822 Contact with and (suspected) exposure to covid-19: Secondary | ICD-10-CM | POA: Diagnosis present

## 2020-07-22 DIAGNOSIS — R918 Other nonspecific abnormal finding of lung field: Secondary | ICD-10-CM

## 2020-07-22 DIAGNOSIS — C3411 Malignant neoplasm of upper lobe, right bronchus or lung: Secondary | ICD-10-CM | POA: Diagnosis present

## 2020-07-22 DIAGNOSIS — Z87891 Personal history of nicotine dependence: Secondary | ICD-10-CM

## 2020-07-22 DIAGNOSIS — D3501 Benign neoplasm of right adrenal gland: Secondary | ICD-10-CM | POA: Diagnosis present

## 2020-07-22 DIAGNOSIS — I130 Hypertensive heart and chronic kidney disease with heart failure and stage 1 through stage 4 chronic kidney disease, or unspecified chronic kidney disease: Secondary | ICD-10-CM | POA: Diagnosis present

## 2020-07-22 DIAGNOSIS — Z8679 Personal history of other diseases of the circulatory system: Secondary | ICD-10-CM

## 2020-07-22 DIAGNOSIS — R001 Bradycardia, unspecified: Secondary | ICD-10-CM | POA: Diagnosis not present

## 2020-07-22 DIAGNOSIS — Z902 Acquired absence of lung [part of]: Secondary | ICD-10-CM

## 2020-07-22 DIAGNOSIS — N1831 Chronic kidney disease, stage 3a: Secondary | ICD-10-CM | POA: Diagnosis present

## 2020-07-22 DIAGNOSIS — J939 Pneumothorax, unspecified: Secondary | ICD-10-CM

## 2020-07-22 DIAGNOSIS — Z01818 Encounter for other preprocedural examination: Secondary | ICD-10-CM | POA: Diagnosis not present

## 2020-07-22 DIAGNOSIS — Z4682 Encounter for fitting and adjustment of non-vascular catheter: Secondary | ICD-10-CM

## 2020-07-22 DIAGNOSIS — I498 Other specified cardiac arrhythmias: Secondary | ICD-10-CM | POA: Diagnosis not present

## 2020-07-22 HISTORY — PX: INTERCOSTAL NERVE BLOCK: SHX5021

## 2020-07-22 HISTORY — PX: NODE DISSECTION: SHX5269

## 2020-07-22 LAB — POCT I-STAT 7, (LYTES, BLD GAS, ICA,H+H)
Acid-base deficit: 1 mmol/L (ref 0.0–2.0)
Bicarbonate: 28.9 mmol/L — ABNORMAL HIGH (ref 20.0–28.0)
Calcium, Ion: 1.24 mmol/L (ref 1.15–1.40)
HCT: 43 % (ref 39.0–52.0)
Hemoglobin: 14.6 g/dL (ref 13.0–17.0)
O2 Saturation: 97 %
Patient temperature: 35.3
Potassium: 5.1 mmol/L (ref 3.5–5.1)
Sodium: 138 mmol/L (ref 135–145)
TCO2: 31 mmol/L (ref 22–32)
pCO2 arterial: 68 mmHg (ref 32.0–48.0)
pH, Arterial: 7.227 — ABNORMAL LOW (ref 7.350–7.450)
pO2, Arterial: 99 mmHg (ref 83.0–108.0)

## 2020-07-22 SURGERY — LOBECTOMY, LUNG, ROBOT-ASSISTED, USING VATS
Anesthesia: General | Site: Chest | Laterality: Right

## 2020-07-22 MED ORDER — IPRATROPIUM-ALBUTEROL 0.5-2.5 (3) MG/3ML IN SOLN
RESPIRATORY_TRACT | Status: AC
  Filled 2020-07-22: qty 3

## 2020-07-22 MED ORDER — SODIUM CHLORIDE 0.9 % IR SOLN
Status: DC | PRN
  Administered 2020-07-22: 1000 mL

## 2020-07-22 MED ORDER — ALBUMIN HUMAN 5 % IV SOLN: INTRAVENOUS | Status: DC | PRN

## 2020-07-22 MED ORDER — LACTATED RINGERS IV SOLN: INTRAVENOUS | Status: DC

## 2020-07-22 MED ORDER — CEFAZOLIN SODIUM-DEXTROSE 2-4 GM/100ML-% IV SOLN
2.0000 g | INTRAVENOUS | Status: AC
  Administered 2020-07-22: 2 g via INTRAVENOUS

## 2020-07-22 MED ORDER — CHLORHEXIDINE GLUCONATE 0.12 % MT SOLN: 15.0000 mL | Freq: Once | OROMUCOSAL | Status: AC

## 2020-07-22 MED ORDER — BUPIVACAINE HCL (PF) 0.5 % IJ SOLN
INTRAMUSCULAR | Status: AC
  Filled 2020-07-22: qty 30

## 2020-07-22 MED ORDER — CHLORHEXIDINE GLUCONATE 0.12 % MT SOLN
OROMUCOSAL | Status: AC
  Administered 2020-07-22: 15 mL via OROMUCOSAL
  Filled 2020-07-22: qty 15

## 2020-07-22 MED ORDER — ACETAMINOPHEN 10 MG/ML IV SOLN
INTRAVENOUS | Status: AC
  Filled 2020-07-22: qty 100

## 2020-07-22 MED ORDER — EPHEDRINE 5 MG/ML INJ
INTRAVENOUS | Status: AC
  Filled 2020-07-22: qty 10

## 2020-07-22 MED ORDER — MIDAZOLAM HCL 2 MG/2ML IJ SOLN
INTRAMUSCULAR | Status: AC
  Filled 2020-07-22: qty 2

## 2020-07-22 MED ORDER — ACETAMINOPHEN 500 MG PO TABS
1000.0000 mg | ORAL_TABLET | Freq: Four times a day (QID) | ORAL | Status: DC
  Administered 2020-07-22 – 2020-07-27 (×18): 1000 mg via ORAL
  Filled 2020-07-22 (×18): qty 2

## 2020-07-22 MED ORDER — ONDANSETRON HCL 4 MG/2ML IJ SOLN
INTRAMUSCULAR | Status: AC
  Filled 2020-07-22: qty 2

## 2020-07-22 MED ORDER — CHLORHEXIDINE GLUCONATE CLOTH 2 % EX PADS
6.0000 | MEDICATED_PAD | Freq: Every day | CUTANEOUS | Status: DC
  Administered 2020-07-23: 6 via TOPICAL

## 2020-07-22 MED ORDER — ONDANSETRON HCL 4 MG/2ML IJ SOLN: 4.0000 mg | Freq: Once | INTRAMUSCULAR | Status: DC | PRN

## 2020-07-22 MED ORDER — MORPHINE SULFATE (PF) 2 MG/ML IV SOLN
2.0000 mg | INTRAVENOUS | Status: DC | PRN
  Administered 2020-07-22 – 2020-07-24 (×4): 2 mg via INTRAVENOUS
  Filled 2020-07-22 (×4): qty 1

## 2020-07-22 MED ORDER — LIDOCAINE 2% (20 MG/ML) 5 ML SYRINGE
INTRAMUSCULAR | Status: AC
  Filled 2020-07-22: qty 5

## 2020-07-22 MED ORDER — ACETAMINOPHEN 160 MG/5ML PO SOLN: 1000.0000 mg | Freq: Four times a day (QID) | ORAL | Status: DC

## 2020-07-22 MED ORDER — DEXAMETHASONE SODIUM PHOSPHATE 10 MG/ML IJ SOLN
INTRAMUSCULAR | Status: DC | PRN
  Administered 2020-07-22: 10 mg via INTRAVENOUS

## 2020-07-22 MED ORDER — ORAL CARE MOUTH RINSE
15.0000 mL | Freq: Two times a day (BID) | OROMUCOSAL | Status: DC
  Administered 2020-07-23 – 2020-07-27 (×6): 15 mL via OROMUCOSAL

## 2020-07-22 MED ORDER — LISINOPRIL 20 MG PO TABS: 40.0000 mg | ORAL_TABLET | Freq: Every day | ORAL | Status: DC

## 2020-07-22 MED ORDER — SENNOSIDES-DOCUSATE SODIUM 8.6-50 MG PO TABS
1.0000 | ORAL_TABLET | Freq: Every day | ORAL | Status: DC
  Administered 2020-07-22 – 2020-07-26 (×5): 1 via ORAL
  Filled 2020-07-22 (×5): qty 1

## 2020-07-22 MED ORDER — PHENYLEPHRINE 40 MCG/ML (10ML) SYRINGE FOR IV PUSH (FOR BLOOD PRESSURE SUPPORT)
PREFILLED_SYRINGE | INTRAVENOUS | Status: AC
  Filled 2020-07-22: qty 10

## 2020-07-22 MED ORDER — EPHEDRINE SULFATE-NACL 50-0.9 MG/10ML-% IV SOSY
PREFILLED_SYRINGE | INTRAVENOUS | Status: DC | PRN
  Administered 2020-07-22: 5 mg via INTRAVENOUS

## 2020-07-22 MED ORDER — FENTANYL CITRATE (PF) 250 MCG/5ML IJ SOLN
INTRAMUSCULAR | Status: AC
  Filled 2020-07-22: qty 5

## 2020-07-22 MED ORDER — PROPOFOL 10 MG/ML IV BOLUS
INTRAVENOUS | Status: AC
  Filled 2020-07-22: qty 20

## 2020-07-22 MED ORDER — DEXAMETHASONE SODIUM PHOSPHATE 10 MG/ML IJ SOLN
INTRAMUSCULAR | Status: AC
  Filled 2020-07-22: qty 1

## 2020-07-22 MED ORDER — SUGAMMADEX SODIUM 200 MG/2ML IV SOLN
INTRAVENOUS | Status: DC | PRN
  Administered 2020-07-22: 200 mg via INTRAVENOUS

## 2020-07-22 MED ORDER — ONDANSETRON HCL 4 MG/2ML IJ SOLN
INTRAMUSCULAR | Status: DC | PRN
  Administered 2020-07-22: 4 mg via INTRAVENOUS

## 2020-07-22 MED ORDER — LACTATED RINGERS IV SOLN: INTRAVENOUS | Status: DC | PRN

## 2020-07-22 MED ORDER — IPRATROPIUM-ALBUTEROL 0.5-2.5 (3) MG/3ML IN SOLN
3.0000 mL | RESPIRATORY_TRACT | Status: DC
  Administered 2020-07-22: 3 mL via RESPIRATORY_TRACT

## 2020-07-22 MED ORDER — CARVEDILOL 3.125 MG PO TABS
3.1250 mg | ORAL_TABLET | Freq: Two times a day (BID) | ORAL | Status: DC
  Administered 2020-07-23 – 2020-07-25 (×6): 3.125 mg via ORAL
  Filled 2020-07-22 (×6): qty 1

## 2020-07-22 MED ORDER — HYDROMORPHONE HCL 1 MG/ML IJ SOLN
INTRAMUSCULAR | Status: AC
  Filled 2020-07-22: qty 1

## 2020-07-22 MED ORDER — CEFAZOLIN SODIUM-DEXTROSE 2-4 GM/100ML-% IV SOLN
INTRAVENOUS | Status: AC
  Administered 2020-07-22: 2 g via INTRAVENOUS
  Filled 2020-07-22: qty 100

## 2020-07-22 MED ORDER — ONDANSETRON HCL 4 MG/2ML IJ SOLN: 4.0000 mg | Freq: Four times a day (QID) | INTRAMUSCULAR | Status: DC | PRN

## 2020-07-22 MED ORDER — ROCURONIUM BROMIDE 10 MG/ML (PF) SYRINGE
PREFILLED_SYRINGE | INTRAVENOUS | Status: AC
  Filled 2020-07-22: qty 10

## 2020-07-22 MED ORDER — CEFAZOLIN SODIUM-DEXTROSE 2-4 GM/100ML-% IV SOLN
2.0000 g | Freq: Three times a day (TID) | INTRAVENOUS | Status: AC
  Administered 2020-07-22: 2 g via INTRAVENOUS
  Filled 2020-07-22 (×2): qty 100

## 2020-07-22 MED ORDER — TRAMADOL HCL 50 MG PO TABS: 50.0000 mg | ORAL_TABLET | Freq: Four times a day (QID) | ORAL | Status: DC | PRN

## 2020-07-22 MED ORDER — PHENYLEPHRINE HCL-NACL 10-0.9 MG/250ML-% IV SOLN
INTRAVENOUS | Status: DC | PRN
  Administered 2020-07-22: 20 ug/min via INTRAVENOUS

## 2020-07-22 MED ORDER — PROPOFOL 10 MG/ML IV BOLUS
INTRAVENOUS | Status: DC | PRN
  Administered 2020-07-22: 160 mg via INTRAVENOUS

## 2020-07-22 MED ORDER — FENTANYL CITRATE (PF) 100 MCG/2ML IJ SOLN
INTRAMUSCULAR | Status: DC | PRN
  Administered 2020-07-22: 50 ug via INTRAVENOUS
  Administered 2020-07-22: 100 ug via INTRAVENOUS

## 2020-07-22 MED ORDER — ORAL CARE MOUTH RINSE: 15.0000 mL | Freq: Once | OROMUCOSAL | Status: AC

## 2020-07-22 MED ORDER — MIDAZOLAM HCL 5 MG/5ML IJ SOLN
INTRAMUSCULAR | Status: DC | PRN
  Administered 2020-07-22: 2 mg via INTRAVENOUS

## 2020-07-22 MED ORDER — 0.9 % SODIUM CHLORIDE (POUR BTL) OPTIME
TOPICAL | Status: DC | PRN
  Administered 2020-07-22: 2000 mL

## 2020-07-22 MED ORDER — ATORVASTATIN CALCIUM 80 MG PO TABS
80.0000 mg | ORAL_TABLET | ORAL | Status: DC
  Administered 2020-07-23 – 2020-07-27 (×3): 80 mg via ORAL
  Filled 2020-07-22 (×4): qty 1

## 2020-07-22 MED ORDER — BUPIVACAINE LIPOSOME 1.3 % IJ SUSP
INTRAMUSCULAR | Status: DC | PRN
  Administered 2020-07-22: 100 mL

## 2020-07-22 MED ORDER — LIDOCAINE 2% (20 MG/ML) 5 ML SYRINGE
INTRAMUSCULAR | Status: DC | PRN
  Administered 2020-07-22: 80 mg via INTRAVENOUS

## 2020-07-22 MED ORDER — ALBUTEROL SULFATE HFA 108 (90 BASE) MCG/ACT IN AERS
INHALATION_SPRAY | RESPIRATORY_TRACT | Status: DC | PRN
  Administered 2020-07-22: 4 via RESPIRATORY_TRACT

## 2020-07-22 MED ORDER — BISACODYL 5 MG PO TBEC
10.0000 mg | DELAYED_RELEASE_TABLET | Freq: Every day | ORAL | Status: DC
  Administered 2020-07-22 – 2020-07-27 (×5): 10 mg via ORAL
  Filled 2020-07-22 (×5): qty 2

## 2020-07-22 MED ORDER — IPRATROPIUM-ALBUTEROL 0.5-2.5 (3) MG/3ML IN SOLN
3.0000 mL | Freq: Four times a day (QID) | RESPIRATORY_TRACT | Status: DC
  Administered 2020-07-22 – 2020-07-23 (×2): 3 mL via RESPIRATORY_TRACT
  Filled 2020-07-22 (×3): qty 3

## 2020-07-22 MED ORDER — AMLODIPINE BESYLATE 10 MG PO TABS: 10.0000 mg | ORAL_TABLET | Freq: Every day | ORAL | Status: DC

## 2020-07-22 MED ORDER — BUPIVACAINE LIPOSOME 1.3 % IJ SUSP
20.0000 mL | INTRAMUSCULAR | Status: DC
  Filled 2020-07-22: qty 20

## 2020-07-22 MED ORDER — ROCURONIUM BROMIDE 10 MG/ML (PF) SYRINGE
PREFILLED_SYRINGE | INTRAVENOUS | Status: DC | PRN
  Administered 2020-07-22: 60 mg via INTRAVENOUS
  Administered 2020-07-22 (×2): 50 mg via INTRAVENOUS
  Administered 2020-07-22: 40 mg via INTRAVENOUS

## 2020-07-22 MED ORDER — HYDROMORPHONE HCL 1 MG/ML IJ SOLN
0.2500 mg | INTRAMUSCULAR | Status: DC | PRN
  Administered 2020-07-22 (×2): 0.5 mg via INTRAVENOUS

## 2020-07-22 MED ORDER — ACETAMINOPHEN 10 MG/ML IV SOLN
1000.0000 mg | Freq: Once | INTRAVENOUS | Status: DC | PRN
  Administered 2020-07-22: 1000 mg via INTRAVENOUS

## 2020-07-22 MED ORDER — HEMOSTATIC AGENTS (NO CHARGE) OPTIME
TOPICAL | Status: DC | PRN
  Administered 2020-07-22: 3 via TOPICAL

## 2020-07-22 SURGICAL SUPPLY — 117 items
BLADE CLIPPER SURG (BLADE) IMPLANT
CANISTER SUCT 3000ML PPV (MISCELLANEOUS) ×6 IMPLANT
CANNULA REDUC XI 12-8 STAPL (CANNULA) ×2
CANNULA REDUC XI 12-8MM STAPL (CANNULA) ×2
CANNULA REDUCER 12-8 DVNC XI (CANNULA) ×2 IMPLANT
CATH THORACIC 28FR (CATHETERS) IMPLANT
CATH THORACIC 28FR RT ANG (CATHETERS) IMPLANT
CATH THORACIC 36FR (CATHETERS) IMPLANT
CATH THORACIC 36FR RT ANG (CATHETERS) IMPLANT
CLIP VESOCCLUDE MED 6/CT (CLIP) IMPLANT
CNTNR URN SCR LID CUP LEK RST (MISCELLANEOUS) ×17 IMPLANT
CONN ST 1/4X3/8  BEN (MISCELLANEOUS) ×2
CONN ST 1/4X3/8 BEN (MISCELLANEOUS) ×1 IMPLANT
CONN Y 3/8X3/8X3/8  BEN (MISCELLANEOUS)
CONN Y 3/8X3/8X3/8 BEN (MISCELLANEOUS) IMPLANT
CONT SPEC 4OZ STRL OR WHT (MISCELLANEOUS) ×34
DEFOGGER SCOPE WARMER CLEARIFY (MISCELLANEOUS) ×3 IMPLANT
DERMABOND ADVANCED (GAUZE/BANDAGES/DRESSINGS) ×2
DERMABOND ADVANCED .7 DNX12 (GAUZE/BANDAGES/DRESSINGS) ×1 IMPLANT
DRAIN CHANNEL 28F RND 3/8 FF (WOUND CARE) ×3 IMPLANT
DRAIN CHANNEL 32F RND 10.7 FF (WOUND CARE) IMPLANT
DRAPE ARM DVNC X/XI (DISPOSABLE) ×4 IMPLANT
DRAPE COLUMN DVNC XI (DISPOSABLE) ×1 IMPLANT
DRAPE CV SPLIT W-CLR ANES SCRN (DRAPES) ×3 IMPLANT
DRAPE DA VINCI XI ARM (DISPOSABLE) ×8
DRAPE DA VINCI XI COLUMN (DISPOSABLE) ×2
DRAPE HALF SHEET 40X57 (DRAPES) ×3 IMPLANT
DRAPE INCISE IOBAN 66X45 STRL (DRAPES) IMPLANT
DRAPE ORTHO SPLIT 77X108 STRL (DRAPES) ×2
DRAPE SURG ORHT 6 SPLT 77X108 (DRAPES) ×1 IMPLANT
ELECT BLADE 6.5 EXT (BLADE) ×3 IMPLANT
ELECT REM PT RETURN 9FT ADLT (ELECTROSURGICAL) ×3
ELECTRODE REM PT RTRN 9FT ADLT (ELECTROSURGICAL) ×1 IMPLANT
GAUZE KITTNER 4X5 RF (MISCELLANEOUS) ×12 IMPLANT
GAUZE SPONGE 4X4 12PLY STRL (GAUZE/BANDAGES/DRESSINGS) ×3 IMPLANT
GLOVE BIO SURGEON STRL SZ 6.5 (GLOVE) ×6 IMPLANT
GLOVE BIO SURGEONS STRL SZ 6.5 (GLOVE) ×3
GLOVE BIOGEL PI IND STRL 6.5 (GLOVE) ×2 IMPLANT
GLOVE BIOGEL PI INDICATOR 6.5 (GLOVE) ×4
GLOVE SURG SS PI 7.0 STRL IVOR (GLOVE) ×3 IMPLANT
GLOVE SURG SS PI 7.5 STRL IVOR (GLOVE) ×3 IMPLANT
GLOVE SURG SYN 7.5  E (GLOVE)
GLOVE SURG SYN 7.5 E (GLOVE) IMPLANT
GOWN STRL REUS W/ TWL LRG LVL3 (GOWN DISPOSABLE) ×5 IMPLANT
GOWN STRL REUS W/ TWL XL LVL3 (GOWN DISPOSABLE) ×3 IMPLANT
GOWN STRL REUS W/TWL 2XL LVL3 (GOWN DISPOSABLE) ×3 IMPLANT
GOWN STRL REUS W/TWL LRG LVL3 (GOWN DISPOSABLE) ×10
GOWN STRL REUS W/TWL XL LVL3 (GOWN DISPOSABLE) ×6
HEMOSTAT SURGICEL 2X14 (HEMOSTASIS) ×9 IMPLANT
IRRIGATION STRYKERFLOW (MISCELLANEOUS) ×1 IMPLANT
IRRIGATOR STRYKERFLOW (MISCELLANEOUS) ×3
KIT BASIN OR (CUSTOM PROCEDURE TRAY) ×3 IMPLANT
KIT SUCTION CATH 14FR (SUCTIONS) IMPLANT
KIT TURNOVER KIT B (KITS) ×3 IMPLANT
LOOP VESSEL SUPERMAXI WHITE (MISCELLANEOUS) IMPLANT
NEEDLE HYPO 25GX1X1/2 BEV (NEEDLE) ×3 IMPLANT
NEEDLE SPNL 22GX3.5 QUINCKE BK (NEEDLE) ×3 IMPLANT
NS IRRIG 1000ML POUR BTL (IV SOLUTION) ×6 IMPLANT
PACK CHEST (CUSTOM PROCEDURE TRAY) ×3 IMPLANT
PAD ARMBOARD 7.5X6 YLW CONV (MISCELLANEOUS) ×6 IMPLANT
PORT ACCESS TROCAR AIRSEAL 12 (TROCAR) ×1 IMPLANT
PORT ACCESS TROCAR AIRSEAL 5M (TROCAR) ×2
RELOAD STAPLER 2.5X45 WHT DVNC (STAPLE) ×4 IMPLANT
RELOAD STAPLER 3.5X45 BLU DVNC (STAPLE) ×5 IMPLANT
RELOAD STAPLER 4.3X45 GRN DVNC (STAPLE) ×1 IMPLANT
SCISSORS LAP 5X35 DISP (ENDOMECHANICALS) IMPLANT
SEAL CANN UNIV 5-8 DVNC XI (MISCELLANEOUS) ×2 IMPLANT
SEAL XI 5MM-8MM UNIVERSAL (MISCELLANEOUS) ×4
SEALANT PROGEL (MISCELLANEOUS) IMPLANT
SEALANT SURG COSEAL 4ML (VASCULAR PRODUCTS) IMPLANT
SEALANT SURG COSEAL 8ML (VASCULAR PRODUCTS) IMPLANT
SET TRI-LUMEN FLTR TB AIRSEAL (TUBING) ×3 IMPLANT
SHEARS HARMONIC HDI 20CM (ELECTROSURGICAL) IMPLANT
SOLUTION ELECTROLUBE (MISCELLANEOUS) ×3 IMPLANT
SPONGE INTESTINAL PEANUT (DISPOSABLE) IMPLANT
SPONGE TONSIL TAPE 1 RFD (DISPOSABLE) IMPLANT
STAPLE RELOAD 45 2.0 GRAY (STAPLE) ×2
STAPLE RELOAD 45 2.0 GRAY DVNC (STAPLE) ×1 IMPLANT
STAPLER 45 SUREFORM CVD (STAPLE) ×2
STAPLER 45 SUREFORM CVD DVNC (STAPLE) ×1 IMPLANT
STAPLER CANNULA SEAL DVNC XI (STAPLE) ×2 IMPLANT
STAPLER CANNULA SEAL XI (STAPLE) ×4
STAPLER RELOAD 2.5X45 WHITE (STAPLE) ×8
STAPLER RELOAD 2.5X45 WHT DVNC (STAPLE) ×4
STAPLER RELOAD 3.5X45 BLU DVNC (STAPLE) ×5
STAPLER RELOAD 3.5X45 BLUE (STAPLE) ×10
STAPLER RELOAD 4.3X45 GREEN (STAPLE) ×2
STAPLER RELOAD 4.3X45 GRN DVNC (STAPLE) ×1
SUT PDS AB 3-0 SH 27 (SUTURE) IMPLANT
SUT PROLENE 4 0 RB 1 (SUTURE)
SUT PROLENE 4-0 RB1 .5 CRCL 36 (SUTURE) IMPLANT
SUT SILK  1 MH (SUTURE) ×4
SUT SILK 1 MH (SUTURE) ×2 IMPLANT
SUT SILK 1 TIES 10X30 (SUTURE) ×3 IMPLANT
SUT SILK 2 0 SH (SUTURE) IMPLANT
SUT SILK 2 0SH CR/8 30 (SUTURE) IMPLANT
SUT SILK 3 0 SH 30 (SUTURE) IMPLANT
SUT SILK 3 0SH CR/8 30 (SUTURE) ×3 IMPLANT
SUT VIC AB 1 CTX 36 (SUTURE)
SUT VIC AB 1 CTX36XBRD ANBCTR (SUTURE) IMPLANT
SUT VIC AB 2-0 CTX 36 (SUTURE) IMPLANT
SUT VIC AB 3-0 MH 27 (SUTURE) IMPLANT
SUT VIC AB 3-0 X1 27 (SUTURE) ×6 IMPLANT
SUT VICRYL 0 TIES 12 18 (SUTURE) ×3 IMPLANT
SUT VICRYL 0 UR6 27IN ABS (SUTURE) ×6 IMPLANT
SUT VICRYL 2 TP 1 (SUTURE) IMPLANT
SYR 20CC LL (SYRINGE) ×6 IMPLANT
SYR BULB IRRIG 60ML STRL (SYRINGE) ×3 IMPLANT
SYSTEM RETRIEVAL ANCHOR 12 (MISCELLANEOUS) ×3 IMPLANT
SYSTEM RETRIEVAL ANCHOR 15 (MISCELLANEOUS) ×3 IMPLANT
SYSTEM SAHARA CHEST DRAIN ATS (WOUND CARE) ×3 IMPLANT
TAPE CLOTH 4X10 WHT NS (GAUZE/BANDAGES/DRESSINGS) ×3 IMPLANT
TAPE CLOTH SURG 4X10 WHT LF (GAUZE/BANDAGES/DRESSINGS) ×3 IMPLANT
TIP APPLICATOR SPRAY EXTEND 16 (VASCULAR PRODUCTS) IMPLANT
TOWEL GREEN STERILE (TOWEL DISPOSABLE) ×3 IMPLANT
TRAY FOLEY MTR SLVR 16FR STAT (SET/KITS/TRAYS/PACK) ×3 IMPLANT
WATER STERILE IRR 1000ML POUR (IV SOLUTION) ×3 IMPLANT

## 2020-07-22 NOTE — Plan of Care (Signed)

## 2020-07-22 NOTE — Anesthesia Postprocedure Evaluation (Signed)
Anesthesia Post Note  Patient: Nicolas Butler  Procedure(s) Performed: XI ROBOTIC ASSISTED THORASCOPY- RIGHT UPPER LOBECTOMY (Right Chest) INTERCOSTAL NERVE BLOCK (Right Chest) NODE DISSECTION (Right Chest)     Patient location during evaluation: PACU Anesthesia Type: General Level of consciousness: awake and alert Pain management: pain level controlled Vital Signs Assessment: post-procedure vital signs reviewed and stable Respiratory status: spontaneous breathing, nonlabored ventilation, respiratory function stable and patient connected to nasal cannula oxygen Cardiovascular status: blood pressure returned to baseline and stable Postop Assessment: no apparent nausea or vomiting Anesthetic complications: no   No complications documented.  Last Vitals:  Vitals:   07/22/20 1330 07/22/20 1355  BP: 116/74 130/80  Pulse: (!) 51 (!) 54  Resp: 16 16  Temp: (!) 36.3 C 36.6 C  SpO2: 97% 92%    Last Pain:  Vitals:   07/22/20 1355  TempSrc: Oral  PainSc:                  March Rummage Eusebio Blazejewski

## 2020-07-22 NOTE — Transfer of Care (Signed)
Immediate Anesthesia Transfer of Care Note  Patient: Nicolas Butler  Procedure(s) Performed: XI ROBOTIC ASSISTED THORASCOPY- RIGHT UPPER LOBECTOMY (Right Chest) INTERCOSTAL NERVE BLOCK (Right Chest) NODE DISSECTION (Right Chest)  Patient Location: PACU  Anesthesia Type:General  Level of Consciousness: drowsy and patient cooperative  Airway & Oxygen Therapy: Patient Spontanous Breathing and Patient connected to face mask oxygen  Post-op Assessment: Report given to RN, Post -op Vital signs reviewed and stable and Patient moving all extremities  Post vital signs: Reviewed and stable  Last Vitals:  Vitals Value Taken Time  BP 134/88 07/22/20 1156  Temp    Pulse 52 07/22/20 1157  Resp 19 07/22/20 1157  SpO2 99 % 07/22/20 1157  Vitals shown include unvalidated device data.  Last Pain:  Vitals:   07/22/20 3532  TempSrc:   PainSc: 0-No pain      Patients Stated Pain Goal: 3 (99/24/26 8341)  Complications: No complications documented.

## 2020-07-22 NOTE — Brief Op Note (Addendum)
07/22/2020  11:39 AM  PATIENT:  Nicolas Butler  69 y.o. male  PRE-OPERATIVE DIAGNOSIS:  RUL CARCINOMA- CLINICAL STAGE IA(T1N0)  POST-OPERATIVE DIAGNOSIS:  RUL CARCINOMA- CLINICAL STAGE IA(T1N0)  PROCEDURE:  Procedure(s): XI ROBOTIC ASSISTED THORASCOPY-  RIGHT UPPER LOBECTOMY (Right) INTERCOSTAL NERVE BLOCK (Right) MEDIASTINAL LYMPH NODE DISSECTION (Right)  SURGEON:  Surgeon(s) and Role:    * Melrose Nakayama, MD - Primary  PHYSICIAN ASSISTANT: Enid Cutter, PA-C  ANESTHESIA:   general  EBL:  250 mL   BLOOD ADMINISTERED:none  DRAINS: 22fr Blake in right pleural space   LOCAL MEDICATIONS USED:  Intercostal and local Exparel  SPECIMEN:  Source of Specimen:  Right upper lung lobe, multiple lymph nodes  DISPOSITION OF SPECIMEN:  PATHOLOGY  COUNTS:  YES  DICTATION: .Dragon Dictation  PLAN OF CARE: Admit to inpatient   PATIENT DISPOSITION:  PACU - hemodynamically stable.   Delay start of Pharmacological VTE agent (>24hrs) due to surgical blood loss or risk of bleeding: no

## 2020-07-22 NOTE — Interval H&P Note (Signed)
History and Physical Interval Note: FEV1 1.8 07/22/2020 7:12 AM  Nicolas Butler  has presented today for surgery, with the diagnosis of RUL CARCINOMA.  The various methods of treatment have been discussed with the patient and family. After consideration of risks, benefits and other options for treatment, the patient has consented to  Procedure(s): XI ROBOTIC ASSISTED THORASCOPY- RIGHT UPPER LOBECTOMY (Right) as a surgical intervention.  The patient's history has been reviewed, patient examined, no change in status, stable for surgery.  I have reviewed the patient's chart and labs.  Questions were answered to the patient's satisfaction.     Melrose Nakayama

## 2020-07-22 NOTE — Anesthesia Procedure Notes (Signed)
Arterial Line Insertion Start/End11/15/2021 7:05 AM, 07/22/2020 7:10 AM Performed by: Darral Dash, DO, Lavell Luster, CRNA, CRNA  Patient location: Pre-op. Preanesthetic checklist: patient identified, IV checked, site marked, risks and benefits discussed, surgical consent, monitors and equipment checked, pre-op evaluation, timeout performed and anesthesia consent Lidocaine 1% used for infiltration Left, radial was placed Catheter size: 20 G Hand hygiene performed  and maximum sterile barriers used   Attempts: 1 Procedure performed without using ultrasound guided technique. Following insertion, dressing applied and Biopatch. Post procedure assessment: normal  Patient tolerated the procedure well with no immediate complications.

## 2020-07-22 NOTE — Anesthesia Procedure Notes (Addendum)
Procedure Name: Intubation Date/Time: 07/22/2020 7:42 AM Performed by: Lavell Luster, CRNA Pre-anesthesia Checklist: Patient identified, Emergency Drugs available, Suction available and Patient being monitored Patient Re-evaluated:Patient Re-evaluated prior to induction Oxygen Delivery Method: Circle System Utilized Preoxygenation: Pre-oxygenation with 100% oxygen Induction Type: IV induction Ventilation: Mask ventilation without difficulty and Oral airway inserted - appropriate to patient size Laryngoscope Size: Mac and 4 Grade View: Grade I Tube type: Oral Endobronchial tube: Double lumen EBT, Left, EBT position confirmed by fiberoptic bronchoscope and EBT position confirmed by auscultation and 39 Fr Tube size (mm): 39 F. Number of attempts: 1 Airway Equipment and Method: Stylet and Oral airway Placement Confirmation: ETT inserted through vocal cords under direct vision,  positive ETCO2 and breath sounds checked- equal and bilateral Tube secured with: Tape Dental Injury: Teeth and Oropharynx as per pre-operative assessment  Comments: Performed by Sueanne Margarita, SRNA

## 2020-07-23 ENCOUNTER — Encounter (HOSPITAL_COMMUNITY): Payer: Self-pay | Admitting: Thoracic Surgery (Cardiothoracic Vascular Surgery)

## 2020-07-23 ENCOUNTER — Inpatient Hospital Stay (HOSPITAL_COMMUNITY): Payer: PPO

## 2020-07-23 LAB — BLOOD GAS, ARTERIAL
Acid-Base Excess: 1.4 mmol/L (ref 0.0–2.0)
Bicarbonate: 25.7 mmol/L (ref 20.0–28.0)
Drawn by: 270271
FIO2: 28
O2 Saturation: 95.6 %
Patient temperature: 37
pCO2 arterial: 42.4 mmHg (ref 32.0–48.0)
pH, Arterial: 7.4 (ref 7.350–7.450)
pO2, Arterial: 75.6 mmHg — ABNORMAL LOW (ref 83.0–108.0)

## 2020-07-23 LAB — BASIC METABOLIC PANEL
Anion gap: 8 (ref 5–15)
BUN: 16 mg/dL (ref 8–23)
CO2: 23 mmol/L (ref 22–32)
Calcium: 8.5 mg/dL — ABNORMAL LOW (ref 8.9–10.3)
Chloride: 102 mmol/L (ref 98–111)
Creatinine, Ser: 1.47 mg/dL — ABNORMAL HIGH (ref 0.61–1.24)
GFR, Estimated: 51 mL/min — ABNORMAL LOW (ref >60)
Glucose, Bld: 152 mg/dL — ABNORMAL HIGH (ref 70–99)
Potassium: 4.3 mmol/L (ref 3.5–5.1)
Sodium: 133 mmol/L — ABNORMAL LOW (ref 135–145)

## 2020-07-23 LAB — CBC
HCT: 41.6 % (ref 39.0–52.0)
Hemoglobin: 14.1 g/dL (ref 13.0–17.0)
MCH: 33.6 pg (ref 26.0–34.0)
MCHC: 33.9 g/dL (ref 30.0–36.0)
MCV: 99 fL (ref 80.0–100.0)
Platelets: 161 10*3/uL (ref 150–400)
RBC: 4.2 MIL/uL — ABNORMAL LOW (ref 4.22–5.81)
RDW: 12.8 % (ref 11.5–15.5)
WBC: 9.5 10*3/uL (ref 4.0–10.5)
nRBC: 0 % (ref 0.0–0.2)

## 2020-07-23 LAB — FUNGUS CULTURE RESULT

## 2020-07-23 LAB — FUNGAL ORGANISM REFLEX

## 2020-07-23 LAB — FUNGUS CULTURE WITH STAIN

## 2020-07-23 MED ORDER — GUAIFENESIN ER 600 MG PO TB12
1200.0000 mg | ORAL_TABLET | Freq: Two times a day (BID) | ORAL | Status: DC
  Administered 2020-07-23 – 2020-07-27 (×9): 1200 mg via ORAL
  Filled 2020-07-23 (×9): qty 2

## 2020-07-23 MED ORDER — IPRATROPIUM-ALBUTEROL 0.5-2.5 (3) MG/3ML IN SOLN: 3.0000 mL | Freq: Four times a day (QID) | RESPIRATORY_TRACT | Status: DC | PRN

## 2020-07-23 MED ORDER — OXYCODONE HCL 5 MG PO TABS
5.0000 mg | ORAL_TABLET | ORAL | Status: DC | PRN
  Administered 2020-07-23 – 2020-07-24 (×4): 10 mg via ORAL
  Filled 2020-07-23 (×5): qty 2

## 2020-07-23 NOTE — Op Note (Signed)
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  PROCEDURE:   Xi robotic-assisted thoracoscopy,  Right upper lobectomy,  Lymph node dissection, and Intercostal nerve blocks levels 3 through 10.  SURGEON:  Modesto Charon, MD  ASSISTANT:  Enid Cutter, PA-C.  ANESTHESIA:  General.  FINDINGS:  Normal appearing nodes.  Relatively complete fissures.  Bronchial margin negative for tumor.  CLINICAL NOTE:  Mr. Hari is a 69 year old man with a history of tobacco abuse prior to quitting 7 years ago.  He recently had a low-dose screening CT and was found to have a right upper lobe lung nodule that was hypermetabolic on PET CT.  Biopsy  showed poorly differentiated carcinoma.  He was advised to undergo surgical resection.  The indications, risks, benefits, and alternatives were discussed in detail with the patient.  He understood and accepted the risks and agreed to proceed.  OPERATIVE NOTE:  Mr. Barnett was brought to the preoperative holding area on 07/22/2020.  Anesthesia placed a central venous catheter and an arterial blood pressure monitoring line.  He was taken to the operating room, anesthetized and intubated with a  double lumen endotracheal tube.  Intravenous antibiotics were administered.  A Foley catheter was placed.  Sequential compression devices were placed on the calves for DVT prophylaxis.  He was placed in a left lateral decubitus position and the right  chest was prepped and draped in the usual sterile fashion.  Single lung ventilation of the left lung was initiated and was tolerated well throughout the  procedure.  A timeout was performed.  A solution containing 20 mL of liposomal bupivacaine and 30 mL of 0.5% bupivacaine and 50 mL of saline was prepared.  This was used for local at the incision sites as well as for the intercostal nerve blocks.  An incision was  made in the 8th interspace in the midaxillary line.  An 8 mm port was inserted.  The thoracoscope was advanced into the chest.  There was good isolation of the right lung, although it was relatively slow to deflate.  Carbon dioxide was insufflated per  protocol.  A 12 mm port was placed in the 8th interspace anterior to the camera port.  A second 12 mm port was placed 5 cm posterior to the camera port in the 8th interspace and an AirSeal port was placed in the 10th interspace posterolaterally.   Intercostal nerve blocks then were performed from the 3rd to the 10th interspace.  10 mL of the bupivacaine solution was injected into a subpleural plane at each level.  Finally, another 8 mm port was placed in the 8th interspace posteriorly for the  retraction arm.  The robot then was deployed and brought to the table.  The camera port was docked, targeting was performed, the remaining ports were docked.  Instruments were inserted with thoracoscopic visualization.  The inferior ligament was divided with bipolar cautery.  There was no definite level 9 node identified, although there was a rounded area of what appeared to be fatty tissue that was removed and sent as a possible lymph node.  All lymph nodes that were encountered during  the dissection were sent as separate specimens for permanent pathology.  All appeared benign  grossly.  The pleural reflection was divided at the hilum posteriorly.  Multiple level 7 nodes were removed and sent and the level 11 nodes were dissected out  at that time as well.  The upper lobe then was retracted inferiorly and the pleural reflection was divided at the hilum between the azygos vein and the hilum.  The pleura  overlying the right paratracheal space was incised.  Care was taken to avoid using  cautery in the vicinity of the phrenic nerve.  The fat pad was resected and sent as the 4 lymph nodes.  Dissection then was carried to the fissure.  The minor fissure was relatively complete except for the far anterior portion and the major fissure  was nearly complete with the exception of the far posterior portion.  The pleura overlying the pulmonary artery was dissected out.  The posterior branch of the right superior pulmonary vein was visible and the dissection was carried along this to  complete the minor fissure.  Once the superior segmental branch of the lower lobe and the posterior ascending branch of the upper lobe were identified, the remainder of the major fissure between the superior segment and the upper lobe was completed with  sequential firings of the robotic stapler using blue cartridges.  The lung then was retracted posteriorly.  The superior pulmonary vein was identified.  The middle lobe branch was identified and preserved.  The upper lobe branches were encircled and  divided with the robotic vascular stapler.  The minor fissure was completed using a blue cartridge on the stapler.  At this point the middle lobe was tacked to the lower lobe with the stapler to prevent torsion.  The posterior ascending branch of  the upper lobe was identified.  It was dissected out, encircled, and divided with the stapler.  Then, the dissection was carried more superiorly, the anterior and apical branches arose much further apart than normal.  Both were normal sized branches.   These were divided separately with the vascular stapler.  There were some nodes along the bronchus that were removed and sent as separate specimens.  There was some bleeding from the bronchial arteries with this.  The robotic stapler then was placed at  the right upper lobe bronchus at its origin, flush with the bronchus intermedius and main stem and  closed.  A green cartridge was used on the stapler for the bronchus.  A test inflation showed aeration of the lower and middle lobes and the stapler was  fired, transecting the bronchus.  The upper lobe would not fit into a 12 mm endoscopic retrieval bag so the AirSeal port was removed and a 15 mm bag was inserted.  The lobe was placed into this.  It should be noted that the vessel loop as well as all the  sponges that had been used during the procedure were removed prior to placing the upper lobe in the retrieval bag.  After the lobe was placed into the retrieval bag, it was brought down to the inferior aspect of the chest.  The robot was undocked.  The  anterior 8th interspace incision was lengthened to approximately 4 cm in length.  The upper lobe then was removed through that incision and sent for frozen section of the bronchial margin, which subsequently returned with no tumor seen.  The chest was  copiously irrigated with warm saline.  A test inflation to 30 cm of water revealed no leakage from the bronchial stump.  A 28-French Keenan Bachelor  drain was placed through the original 8th interspace incision and directed to the apex.  It was secured with a #1  silk suture.  Dual-lung ventilation was resumed.  The frozen section returned showing no tumor at the bronchial margin.  The incisions then were closed in standard fashion with 0 Vicryl fascial sutures and 3-0 Vicryl subcuticular sutures.  The Blake  drain was placed to Pleur-evac on waterseal.  The patient then was placed back in a supine position.  He was extubated in the operating room and taken to the [place] [facility] in good condition.  HN/NUANCE  D:07/22/2020 T:07/23/2020 JOB:[id number]

## 2020-07-23 NOTE — Progress Notes (Signed)
Complained  Like some phlegm stuck on his throat. With audible  Crackles. Throat suctioned with yankuer with minimal secretions. PA seen pt with order. Claimed to feel a little bit better after few min. Continue to monitor.

## 2020-07-23 NOTE — Progress Notes (Signed)
Ambulated along the hallway on room air  With front wheel walker about 350 feet, tolerated well. Encouraged to make use of IS . Continue to monitor.

## 2020-07-23 NOTE — Progress Notes (Addendum)
      [address]       [place] [address]             [phone]      1 Day Post-Op Procedure(s) (LRB): XI ROBOTIC ASSISTED THORASCOPY- RIGHT UPPER LOBECTOMY (Right) INTERCOSTAL NERVE BLOCK (Right) NODE DISSECTION (Right) Subjective: Sitting up in bed, says he is having trouble clearing secretions. Has a coarse cough. Pain control fair.   Objective: Vital signs in last 24 hours: Temp:  [97.4 F (36.3 C)-99 F (37.2 C)] 98.7 F (37.1 C) (11/16 0319) Pulse Rate:  [51-69] 51 (11/16 0319) Cardiac Rhythm: Heart block (11/16 0700) Resp:  [12-20] 16 (11/16 0319) BP: (104-134)/(67-88) 116/70 (11/16 0319) SpO2:  [92 %-100 %] 94 % (11/16 0319) Arterial Line BP: (138-148)/(60-70) 138/60 (11/15 1230)    Intake/Output from previous day: 11/15 0701 - 11/16 0700 In: 2485.2 [P.O.:720; I.V.:1200; IV Piggyback:515.2] Out: 8938 [Urine:2140; Blood:250; Chest Tube:162] Intake/Output this shift: No intake/output data recorded.  General appearance: alert, cooperative and mild distress Neurologic: intact Heart: regular rate and rhythm Lungs:  coarse ronchi, small air leak with cough. Minimal CT drainage. CXR shows good expansion of the right lung. I do not see a PTX (poor quality film). Wound: Port incisions are dry.   Lab Results: Recent Labs    07/22/20 1015 07/23/20 0055  WBC  --  9.5  HGB 14.6 14.1  HCT 43.0 41.6  PLT  --  161   BMET:  Recent Labs    07/22/20 1015 07/23/20 0055  NA 138 133*  K 5.1 4.3  CL  --  102  CO2  --  23  GLUCOSE  --  152*  BUN  --  16  CREATININE  --  1.47*  CALCIUM  --  8.5*    PT/INR: No results for input(s): LABPROT, INR in the last 72 hours. ABG    Component Value Date/Time   PHART 7.400 07/23/2020 0438   HCO3 25.7 07/23/2020 0438   TCO2 31 07/22/2020 1015   ACIDBASEDEF 1.0 07/22/2020 1015   O2SAT 95.6 07/23/2020 0438   CBG (last 3)  No results for input(s): GLUCAP in the last 72 hours.  Assessment/Plan: S/P  Procedure(s) (LRB): XI ROBOTIC ASSISTED THORASCOPY- RIGHT UPPER LOBECTOMY (Right) INTERCOSTAL NERVE BLOCK (Right) NODE DISSECTION (Right)  -POD1 robotic assisted right upper lobectomy for stage IA poorly differentiated adenocarcinoma. Having difficulty clearing secretions primarily due to ineffective cough. Will mobilize today, add Mucinex poq12h and flutter valve q2h. Leave CT to water seal.   -Stage IIIA chronic renal insufficiency- baseline creat ~1.4. Monitor.   -History of HTN- BP 120-130's.  Will resume his low-dose carvedilol but hold off on restarting amlodipine and ACE-I.   -DVT PPX- SQ enoxaparin.     LOS: 1 day    Antony Odea, PA-C [phone] 07/23/2020 Patient seen and examined, agree with above IS, flutter, mucinex Leave CT on water seal today. Agree re: CXR, may have a small apical space SCD + enoxaparin + Ambulation for DVT prophylaxis  Remo Lipps C. Roxan Hockey, MD [facility] [phone]

## 2020-07-23 NOTE — Plan of Care (Signed)

## 2020-07-24 ENCOUNTER — Inpatient Hospital Stay (HOSPITAL_COMMUNITY): Payer: PPO

## 2020-07-24 LAB — CBC
HCT: 40.9 % (ref 39.0–52.0)
Hemoglobin: 14 g/dL (ref 13.0–17.0)
MCH: 34.1 pg — ABNORMAL HIGH (ref 26.0–34.0)
MCHC: 34.2 g/dL (ref 30.0–36.0)
MCV: 99.8 fL (ref 80.0–100.0)
Platelets: 180 10*3/uL (ref 150–400)
RBC: 4.1 MIL/uL — ABNORMAL LOW (ref 4.22–5.81)
RDW: 13.1 % (ref 11.5–15.5)
WBC: 9.7 10*3/uL (ref 4.0–10.5)
nRBC: 0 % (ref 0.0–0.2)

## 2020-07-24 LAB — COMPREHENSIVE METABOLIC PANEL
ALT: 14 U/L (ref 0–44)
AST: 19 U/L (ref 15–41)
Albumin: 3.2 g/dL — ABNORMAL LOW (ref 3.5–5.0)
Alkaline Phosphatase: 61 U/L (ref 38–126)
Anion gap: 7 (ref 5–15)
BUN: 22 mg/dL (ref 8–23)
CO2: 29 mmol/L (ref 22–32)
Calcium: 8.7 mg/dL — ABNORMAL LOW (ref 8.9–10.3)
Chloride: 98 mmol/L (ref 98–111)
Creatinine, Ser: 1.59 mg/dL — ABNORMAL HIGH (ref 0.61–1.24)
GFR, Estimated: 47 mL/min — ABNORMAL LOW (ref >60)
Glucose, Bld: 129 mg/dL — ABNORMAL HIGH (ref 70–99)
Potassium: 5 mmol/L (ref 3.5–5.1)
Sodium: 134 mmol/L — ABNORMAL LOW (ref 135–145)
Total Bilirubin: 0.8 mg/dL (ref 0.3–1.2)
Total Protein: 6.1 g/dL — ABNORMAL LOW (ref 6.5–8.1)

## 2020-07-24 LAB — SURGICAL PATHOLOGY

## 2020-07-24 NOTE — Progress Notes (Addendum)
      [address]       [place] [address]             [phone]      2 Days Post-Op Procedure(s) (LRB): XI ROBOTIC ASSISTED THORASCOPY- RIGHT UPPER LOBECTOMY (Right) INTERCOSTAL NERVE BLOCK (Right) NODE DISSECTION (Right) Subjective: Sitting up in bed, having more chest soreness today but breathing much easier. Upper airway congestion has cleared.   Objective: Vital signs in last 24 hours: Temp:  [97.9 F (36.6 C)-98.9 F (37.2 C)] 98.1 F (36.7 C) (11/17 0300) Pulse Rate:  [60-76] 68 (11/17 0728) Cardiac Rhythm: Sinus bradycardia (11/17 0426) Resp:  [16-20] 20 (11/17 0728) BP: (118-149)/(69-83) 133/72 (11/17 0728) SpO2:  [92 %-97 %] 95 % (11/17 0728)    Intake/Output from previous day: 11/16 0701 - 11/17 0700 In: 660 [P.O.:660] Out: 1860 [Urine:1650; Chest Tube:210] Intake/Output this shift: No intake/output data recorded.  General appearance: alert, cooperative and mild distress Neurologic: intact Heart: regular rate and rhythm Lungs:  Breath sounds are clear today, small air leak with cough. Minimal CT drainage.  Wound: Port incisions are dry.   Lab Results: Recent Labs    07/23/20 0055 07/24/20 0106  WBC 9.5 9.7  HGB 14.1 14.0  HCT 41.6 40.9  PLT 161 180   BMET:  Recent Labs    07/23/20 0055 07/24/20 0106  NA 133* 134*  K 4.3 5.0  CL 102 98  CO2 23 29  GLUCOSE 152* 129*  BUN 16 22  CREATININE 1.47* 1.59*  CALCIUM 8.5* 8.7*    PT/INR: No results for input(s): LABPROT, INR in the last 72 hours. ABG    Component Value Date/Time   PHART 7.400 07/23/2020 0438   HCO3 25.7 07/23/2020 0438   TCO2 31 07/22/2020 1015   ACIDBASEDEF 1.0 07/22/2020 1015   O2SAT 95.6 07/23/2020 0438   CBG (last 3)  No results for input(s): GLUCAP in the last 72 hours.  Assessment/Plan: S/P Procedure(s) (LRB): XI ROBOTIC ASSISTED THORASCOPY- RIGHT UPPER LOBECTOMY (Right) INTERCOSTAL NERVE BLOCK (Right) NODE DISSECTION (Right)  -POD1 robotic  assisted right upper lobectomy for stage IA poorly differentiated adenocarcinoma. Small air leak but right lung appears well expanded on CXR.  Leave CT to water seal.   -Stage IIIA chronic renal insufficiency- baseline creat ~1.4. Creat 1.59 today and K+5.0, continue to monitor.    -History of HTN- BP control adequate.  Will continue low-dose carvedilol but hold off on restarting amlodipine and ACE-I.   -DVT PPX- SQ enoxaparin.     LOS: 2 days    Antony Odea, PA-C [phone] 07/24/2020 Patient seen and examined, agree with above Small air leak- keep tube on water seal Creatinine up slightly - monitor  Remo Lipps C. Roxan Hockey, MD [facility] [phone]

## 2020-07-24 NOTE — Discharge Summary (Addendum)
Physician Discharge Summary  Patient ID: Nicolas Butler MRN: [id number] DOB/AGE: 69/09/1950 69 y.o.  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 70 pack year history of smoking, quit 7 years ago.  He had a low dose screening CT and was found to have a right upper lobe lung mass. Hypermetabolic on PET/ CT. Bronchoscopy with biopsy revealed a poorly differentiated carcinoma. Clinical stage IA (T1N0).  He has a good functional status and should be able to tolerate a lobectomy without issue. Does need PFTs to confirm adequate pulmonary reserve but more of a formality given his exercise tolerance.  We discussed the treatment options of surgery and radiation and relative advantages and disadvantages of each. I recommended we proceed with robotic right upper lobectomy.  I discussed the general nature of the procedure, the need for general anesthesia, the incisions to be used, and the use of a drainage postoperatively with Nicolas Butler.  We discussed the expected hospital stay, overall recovery and short and long term outcomes. He understands the risks include, but are not limited to death, stroke, MI, DVT/PE, bleeding, possible need for transfusion, infections, prolonged air leaks, cardiac arrhythmias, conversion to thoracotomy, as well as other organ system dysfunction including respiratory, renal, or GI complications.    He accepts the risks and agrees to proceed.   Hospital Course:   Nicolas Butler was admitted to the hospital for elective surgery on 07/22/2020.  He was prepared and taken to the operating room where robotic assisted right upper lobectomy was carried out.  See the operative note below for details.  Following the procedure, patient was extubated and transferred to the postanesthesia care unit.  Transfer status remained stable.  He had a small air leak.  Chest tube was left to waterseal immediately following surgery.  After initial recovery in the PACU, he was transferred to [facility] progressive care.  He remained stable.  On the first postoperative day, he had a small apical space on the right side on chest x-ray.  He also complained of thick continuous respiratory secretions that he was having difficulty clearing.  He was started on Mucinex and also encouraged to use flutter valve and incentive spirometry.  He was able to clear the secretions.  His right lung also completely expanded evidenced by the chest x-ray on the following morning.  He continued to have a small air leak.  Chest tube was left to waterseal.  He was mobilized.  His diet was advanced and well-tolerated.  Pain control was adequate with oral agents.  He had a slight rise in his creatinine from baseline line of 1.4-1.59.  This was monitored and trended back toward baseline prior to discharge. Once the air leak subsided, the chest tube was removed on 07/26/2020.  Follow-up chest x-ray showed good expansion of both lungs.  He had an episode of bradycardia on the morning of 07/26/2020.  His carvedilol was held while we requested cardiology consultation.  After review of his clinical history and rhythm  strips, the cardiologist did not feel any further work-up was necessary and approved resuming his carvedilol.  Consults: cardiology  Significant Diagnostic Studies:  NUCLEAR MEDICINE PET SKULL BASE TO THIGH  TECHNIQUE: 9.3 mCi F-18 FDG was  injected intravenously. Full-ring PET imaging was performed from the skull base to thigh after the radiotracer. CT data was obtained and used for attenuation correction and anatomic localization.  Fasting blood glucose: 81 mg/dl  COMPARISON: None.  FINDINGS: Mediastinal blood pool activity: SUV max 2.75  Liver activity: SUV max NA  NECK: No hypermetabolic lymph nodes in the neck.  Incidental CT findings: none  CHEST: No right upper lobe central perihilar nodule measures 2.9 x 1.5 cm with SUV max of 14.51. Anteromedial right upper lobe distal airway enlargement appears FDG avid with SUV max of 13.6 concerning for endobronchial spread of tumor. Multiple tiny nodules are identified within the periphery of the anterior right upper lobe as well as the anterior basal right upper lobe. These are too small to reliably characterize, image 27/8 and image 37/8. Many of these have a tree-in-bud configuration reflecting inflammatory/infectious bronchiolitis.  Within the posterior right lower lobe there is a tiny 4 mm nodule, image 49/8. This is too small to reliably characterize. No FDG avid mediastinal, left hilar, or subcarinal lymph nodes. No FDG avid axillary or supraclavicular lymph nodes.  Incidental CT findings: Aortic atherosclerosis. Coronary artery calcifications. No pericardial effusion. Emphysema.  ABDOMEN/PELVIS: No abnormal FDG uptake within the liver, pancreas, or spleen. Right adrenal gland adenoma measures 1.8 cm. Small low-attenuation nodules within the right adrenal gland are identified with mild FDG uptake. Index left adrenal nodule measures 1.4 x 1.8 cm and -4.55 Hounsfield units compatible with a benign adenoma. Low level FDG uptake within this nodule has an SUV max of 4.3. Smaller adenoma measures 1.2 cm and -3.46 Hounsfield units. This has an SUV max of 4.53. Increased uptake at the level of the GE junction is noted without corresponding soft tissue  mass. SUV max is equal to 5.78. No FDG avid abdominopelvic lymph nodes.  Incidental CT findings: Signs of previous abdominal aortic aneurysm repair. Aortic atherosclerosis. 3.1 cm right kidney cyst.  SKELETON: No focal hypermetabolic activity to suggest skeletal metastasis.  Incidental CT findings: none  IMPRESSION: 1. Right upper lobe central perihilar lesion is intensely hypermetabolic compatible with primary bronchogenic carcinoma. Signs of endobronchial spread of tumor is identified with FDG avid distended distal airways noted within the anteromedial right upper lobe. 2. No signs of FDG avid mediastinal or contralateral hilar nodal metastasis or distant metastatic disease. 3. Multiple tiny peripheral nodules are identified within the right upper lobe. Many of these have a tree-in-bud configuration and may represent areas of bronchiolitis. Small foci metastasis not excluded. 4. Bilateral adrenal gland adenomas. 5. Aortic Atherosclerosis (ICD10-I70.0) and Emphysema (ICD10-J43.9). Coronary artery calcifications.   Electronically Signed By: Kerby Moors M.D. On: 06/28/2020 10:39 I personally reviewed the PET/CT images and concur with the findings noted above   Treatments:   OPERATIVE REPORT  DATE OF PROCEDURE:  07/22/2020  PREOPERATIVE DIAGNOSIS:  Poorly differentiated carcinoma of right upper lobe, clinical stage IA.  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  PROCEDURE:  Xi robotic-assisted thoracoscopy, right upper lobectomy, lymph node dissection, and intercostal nerve blocks at levels 3 through 10.  SURGEON:  Modesto Charon, MD  ASSISTANT:  Enid Cutter, M.D.  ANESTHESIA:  General.  FINDINGS:  Normal appearing nodes relatively complete fissures.  Bronchial margin negative  for tumor.  CLINICAL NOTE:  The patient is a 69 year old man with a history of tobacco abuse prior to  quitting 7 years ago.  He recently had a low-dose screening CT and was found to have a right upper lobe lung nodule that was hypermetabolic on PET CT.  Biopsy  showed poorly differentiated carcinoma.  He was advised to undergo surgical resection.  The indications, risks, benefits, and alternatives were discussed in detail with the patient.  He understood and accepted the risks and agreed to proceed.  Discharge Exam: Blood pressure 123/65, pulse 67, temperature 98.8 F (37.1 C), temperature source Oral, resp. rate 20, height 5\' 9"  (1.753 m), weight 90.2 kg, SpO2 95 %.  General appearance: alert, cooperative and no distress Neurologic: intact Heart: Sinus rhythm with a few PACs Lungs: diminished breath sounds right base Wound: clean and dry, R flank ecchymosis no air leak.  Mild erythema at port sites.  No drainage.  Disposition:  Mr. Strader discharged home in stable condition.    Allergies as of 07/27/2020      Reactions   Hydrochlorothiazide Other (See Comments)   Pt states "it made my legs hurt"   Pravastatin Other (See Comments)   Muscle pain      Medication List    STOP taking these medications   amLODipine 10 MG tablet Commonly known as: NORVASC     TAKE these medications   albuterol (2.5 MG/3ML) 0.083% nebulizer solution Commonly known as: PROVENTIL Take 2.5 mg by nebulization every 6 (six) hours as needed for wheezing or shortness of breath.   atorvastatin 80 MG tablet Commonly known as: LIPITOR Take 80 mg by mouth every other day. AT NIGHT   carvedilol 3.125 MG tablet Commonly known as: COREG Take 1 tablet (3.125 mg total) by mouth 2 (two) times daily.   lisinopril 40 MG tablet Commonly known as: ZESTRIL Take 1 tablet (40 mg total) by mouth daily.   traMADol 50 MG tablet Commonly known as: ULTRAM Take 1-2 tablets (50-100 mg total) by mouth every 6 (six) hours as needed (mild pain).   Vitamin D3 125 MCG (5000 UT) Caps Take 10,000 Units by mouth daily  after breakfast.       Follow-up Information    Melrose Nakayama, MD. Go on 08/13/2020.   Specialty: Cardiothoracic Surgery Why: Your appointment is at 11am on Tuesday 08/13/20.  Please arrive 30 minutes early for a chest X-ray to be performed by [facility] located on the first floor of the same building.  Contact information: [address] [place] [address] [phone]        [facility].   Specialty: Cardiothoracic Surgery Why: Our office will call with an appointment for next week for suture removal.  If you do not hear from them by Tuesday please call to make the appointment at [place] information: [place], [place] [phone]              Signed: Antony Odea, PA-C 07/27/2020, 11:43 AM

## 2020-07-24 NOTE — Plan of Care (Signed)

## 2020-07-25 ENCOUNTER — Ambulatory Visit: Payer: PPO | Admitting: Internal Medicine

## 2020-07-25 ENCOUNTER — Inpatient Hospital Stay (HOSPITAL_COMMUNITY): Payer: PPO

## 2020-07-25 LAB — BASIC METABOLIC PANEL
Anion gap: 8 (ref 5–15)
BUN: 21 mg/dL (ref 8–23)
CO2: 28 mmol/L (ref 22–32)
Calcium: 8.6 mg/dL — ABNORMAL LOW (ref 8.9–10.3)
Chloride: 98 mmol/L (ref 98–111)
Creatinine, Ser: 1.44 mg/dL — ABNORMAL HIGH (ref 0.61–1.24)
GFR, Estimated: 53 mL/min — ABNORMAL LOW (ref >60)
Glucose, Bld: 110 mg/dL — ABNORMAL HIGH (ref 70–99)
Potassium: 4.4 mmol/L (ref 3.5–5.1)
Sodium: 134 mmol/L — ABNORMAL LOW (ref 135–145)

## 2020-07-25 MED ORDER — ENOXAPARIN SODIUM 40 MG/0.4ML ~~LOC~~ SOLN
40.0000 mg | Freq: Every day | SUBCUTANEOUS | Status: DC
  Administered 2020-07-25 – 2020-07-27 (×3): 40 mg via SUBCUTANEOUS
  Filled 2020-07-25 (×3): qty 0.4

## 2020-07-25 NOTE — Progress Notes (Addendum)
      [address]       [place] [address]             [phone]      3 Days Post-Op Procedure(s) (LRB): XI ROBOTIC ASSISTED THORASCOPY- RIGHT UPPER LOBECTOMY (Right) INTERCOSTAL NERVE BLOCK (Right) NODE DISSECTION (Right) Subjective: More comfortable today. No new concerns.  Ambulating in halls. BM x 2 yesterday.   Objective: Vital signs in last 24 hours: Temp:  [98.2 F (36.8 C)-98.9 F (37.2 C)] 98.2 F (36.8 C) (11/18 0329) Pulse Rate:  [62-71] 62 (11/18 0329) Cardiac Rhythm: Heart block (11/18 0727) Resp:  [17-22] 19 (11/18 0329) BP: (115-139)/(68-80) 137/80 (11/18 0329) SpO2:  [94 %-96 %] 94 % (11/18 0329)    Intake/Output from previous day: 11/17 0701 - 11/18 0700 In: 220 [P.O.:220] Out: 1680 [Urine:1450; Chest Tube:230] Intake/Output this shift: No intake/output data recorded.  General appearance: alert, cooperativ, no distress Neurologic: intact Heart: SR with PAC's Lungs:  Breath sounds are clear today, small air leak continues. CT is secure.  Minimal CT drainage.  Wound: Port incisions are dry.   Lab Results: Recent Labs    07/23/20 0055 07/24/20 0106  WBC 9.5 9.7  HGB 14.1 14.0  HCT 41.6 40.9  PLT 161 180   BMET:  Recent Labs    07/24/20 0106 07/25/20 0044  NA 134* 134*  K 5.0 4.4  CL 98 98  CO2 29 28  GLUCOSE 129* 110*  BUN 22 21  CREATININE 1.59* 1.44*  CALCIUM 8.7* 8.6*    PT/INR: No results for input(s): LABPROT, INR in the last 72 hours. ABG    Component Value Date/Time   PHART 7.400 07/23/2020 0438   HCO3 25.7 07/23/2020 0438   TCO2 31 07/22/2020 1015   ACIDBASEDEF 1.0 07/22/2020 1015   O2SAT 95.6 07/23/2020 0438   CBG (last 3)  No results for input(s): GLUCAP in the last 72 hours.  Assessment/Plan: S/P Procedure(s) (LRB): XI ROBOTIC ASSISTED THORASCOPY- RIGHT UPPER LOBECTOMY (Right) INTERCOSTAL NERVE BLOCK (Right) NODE DISSECTION (Right)  -POD3 robotic assisted right upper lobectomy for stage  IA poorly differentiated adenocarcinoma. Small air leak but right lung appears well expanded on CXR.  Leave CT to water seal.   -Stage IIIA chronic renal insufficiency- baseline creat ~1.4 and rose to 1.59 yesterday. Now trending down.  K+4.4, continue to monitor.    -History of HTN- BP control remains adequate.  Will continue low-dose carvedilol but hold off on restarting amlodipine and ACE-I.   -DVT PPX- SQ enoxaparin.     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 Roxan Hockey, MD [facility] [phone]

## 2020-07-25 NOTE — Progress Notes (Signed)
Patient's HR dropped down to 27 and then stayed in 30s and 40s for a minute and went back up to 50s and sustained in 60s. EKG showed SR with 1st degree HB, PACs. Dr. Prescott Gum was paged. Made MD aware of the event and received verbal order to d/c coreg and add BNP in morning lab.  This RN also noticed some bruising right below the chest tube insertion site, MD made aware of it too.

## 2020-07-26 ENCOUNTER — Inpatient Hospital Stay (HOSPITAL_COMMUNITY): Payer: PPO

## 2020-07-26 DIAGNOSIS — I498 Other specified cardiac arrhythmias: Secondary | ICD-10-CM

## 2020-07-26 DIAGNOSIS — R001 Bradycardia, unspecified: Secondary | ICD-10-CM

## 2020-07-26 LAB — BRAIN NATRIURETIC PEPTIDE: B Natriuretic Peptide: 59.9 pg/mL (ref 0.0–100.0)

## 2020-07-26 MED ORDER — CARVEDILOL 3.125 MG PO TABS
3.1250 mg | ORAL_TABLET | Freq: Two times a day (BID) | ORAL | Status: DC
  Administered 2020-07-26 – 2020-07-27 (×2): 3.125 mg via ORAL
  Filled 2020-07-26 (×2): qty 1

## 2020-07-26 MED ORDER — LISINOPRIL 20 MG PO TABS
20.0000 mg | ORAL_TABLET | Freq: Every day | ORAL | Status: DC
  Administered 2020-07-26 – 2020-07-27 (×2): 20 mg via ORAL
  Filled 2020-07-26 (×2): qty 1

## 2020-07-26 NOTE — Consult Note (Signed)
CARDIOLOGY CONSULT NOTE       Patient ID: Nicolas Butler MRN: [id number] DOB/AGE: 03-03-1951 69 y.o.  Admit date: 07/22/2020 Referring Physician: Roxan Hockey Primary Physician: Lavella Lemons, PA Primary Cardiologist: Harl Bowie in [place] Reason for Consultation: Bradycardia / Arrhythmia   Active Problems:   S/P lobectomy of lung   HPI:  69 y.o. now 3 days post RULobectomy for cancer. Previous 50 packyear smoker quit 7 years ago. Cancer found on screening CT. Margins and nodes ok post robotic surgery. He carries a diagnosis of CHF but last echo done August 2018 with EF 60-65% Has had abnormal myovue in past July 2018 suggesting inferior wall defect but normal wall motion He does not have angina or chest pain Given normal EF in 2018 and lack of symptoms Dr Harl Bowie did not pursue cath. His post op course has been unremarkable. He has no dyspnea, palpitations or chest pain. Wounds healing well although he has a lot of bruising over his right flank, He is planning on going home tomorrow. ? Of bradycardia last night and NSVT. 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 AAA (abdominal aortic aneurysm) ([place])   . CHF (congestive heart failure) ([place])   . Chronic kidney disease (CKD), stage III (moderate) ([facility])   . Complication of anesthesia    Bowel and bladder are slow to wake up after surgery  . Coronary artery disease    presumed  . Dyspnea    exertion  . High cholesterol   . Unspecified essential hypertension     Family History  Problem Relation Age of Onset  . Pneumonia Mother   . Other Father        brain tumor  . Diabetes Son   . Lung cancer Neg Hx     Social History   Socioeconomic History  . Marital status: Legally Separated    Spouse name: Not on file   . Number of children: Not on file  . Years of education: Not on file  . Highest education level: Not on file  Occupational History  . Not on file  Tobacco Use  . Smoking status: Former Smoker    Packs/day: 1.50    Years: 46.00    Pack years: 69.00    Types: Cigarettes    Start date: 05/17/1965    Quit date: 12/08/2012    Years since quitting: 7.6  . Smokeless tobacco: Never Used  Vaping Use  . Vaping Use: Never used  Substance and Sexual Activity  . Alcohol use: No    Alcohol/week: 0.0 standard drinks  . Drug use: No  . Sexual activity: Not on file  Other Topics Concern  . Not on file  Social History Narrative  . Not on file   Social Determinants of Health   Financial Resource Strain:   . Difficulty of Paying Living Expenses: Not on file  Food Insecurity:   . Worried About Charity fundraiser in the Last Year: Not on file  . Ran Out of Food in the Last Year: Not on file  Transportation Needs:   . Lack of Transportation (Medical): Not on file  . Lack of Transportation (Non-Medical): Not on file  Physical Activity:   . Days of Exercise per  Week: Not on file  . Minutes of Exercise per Session: Not on file  Stress:   . Feeling of Stress : Not on file  Social Connections:   . Frequency of Communication with Friends and Family: Not on file  . Frequency of Social Gatherings with Friends and Family: Not on file  . Attends Religious Services: Not on file  . Active Member of Clubs or Organizations: Not on file  . Attends Archivist Meetings: Not on file  . Marital Status: Not on file  Intimate Partner Violence:   . Fear of Current or Ex-Partner: Not on file  . Emotionally Abused: Not on file  . Physically Abused: Not on file  . Sexually Abused: Not on file    Past Surgical History:  Procedure Laterality Date  . ABDOMINAL AORTIC ANEURYSM REPAIR N/A 06/28/2017   Procedure: ANEURYSM ABDOMINAL AORTIC REPAIR OPEN;  Surgeon: Elam Dutch, MD;  Location: [facility];   Service: Vascular;  Laterality: N/A;  . BRONCHIAL BRUSHINGS  07/02/2020   Procedure: BRONCHIAL BRUSHINGS;  Surgeon: Spero Geralds, MD;  Location: [facility];  Service: Pulmonary;;  . BRONCHIAL WASHINGS  07/02/2020   Procedure: BRONCHIAL WASHINGS;  Surgeon: Spero Geralds, MD;  Location: [facility];  Service: Pulmonary;;  BAL and washings  . HAND SURGERY Left   . HERNIA REPAIR    . INCISIONAL HERNIA REPAIR     LEFT  . INTERCOSTAL NERVE BLOCK Right 07/22/2020   Procedure: INTERCOSTAL NERVE BLOCK;  Surgeon: Melrose Nakayama, MD;  Location: [facility];  Service: Thoracic;  Laterality: Right;  . IRRIGATION AND DEBRIDEMENT SEBACEOUS CYST     FROM RIGHT SHOULDER  . LUNG BIOPSY  07/02/2020   Procedure: LUNG BIOPSY;  Surgeon: Spero Geralds, MD;  Location: [facility];  Service: Pulmonary;;  . NODE DISSECTION Right 07/22/2020   Procedure: NODE DISSECTION;  Surgeon: Melrose Nakayama, MD;  Location: [facility];  Service: Thoracic;  Laterality: Right;  . UMBILICAL HERNIA REPAIR    . VIDEO BRONCHOSCOPY Right 07/02/2020   Procedure: VIDEO BRONCHOSCOPY WITHOUT FLUORO;  Surgeon: Spero Geralds, MD;  Location: [facility];  Service: Pulmonary;  Laterality: Right;      Current Facility-Administered Medications:  .  acetaminophen (TYLENOL) tablet 1,000 mg, 1,000 mg, Oral, Q6H, 1,000 mg at 07/26/20 0516 **OR** acetaminophen (TYLENOL) 160 MG/5ML solution 1,000 mg, 1,000 mg, Oral, Q6H, Roddenberry, Myron G, PA-C .  atorvastatin (LIPITOR) tablet 80 mg, 80 mg, Oral, QODAY, Roddenberry, Myron G, PA-C, 80 mg at 07/25/20 0850 .  bisacodyl (DULCOLAX) EC tablet 10 mg, 10 mg, Oral, Daily, Roddenberry, Myron G, PA-C, 10 mg at 07/26/20 0830 .  enoxaparin (LOVENOX) injection 40 mg, 40 mg, Subcutaneous, Daily, Melrose Nakayama, MD, 40 mg at 07/26/20 0830 .  guaiFENesin (MUCINEX) 12 hr tablet 1,200 mg, 1,200 mg, Oral, BID, Roddenberry, Myron G, PA-C, 1,200 mg at 07/26/20 0829 .  ipratropium-albuterol  (DUONEB) 0.5-2.5 (3) MG/3ML nebulizer solution 3 mL, 3 mL, Nebulization, Q6H PRN, Melrose Nakayama, MD .  MEDLINE mouth rinse, 15 mL, Mouth Rinse, BID, Melrose Nakayama, MD, 15 mL at 07/25/20 2114 .  morphine 2 MG/ML injection 2 mg, 2 mg, Intravenous, Q2H PRN, Roddenberry, Myron G, PA-C, 2 mg at 07/24/20 1453 .  ondansetron (ZOFRAN) injection 4 mg, 4 mg, Intravenous, Q6H PRN, Roddenberry, Myron G, PA-C .  oxyCODONE (Oxy IR/ROXICODONE) immediate release tablet 5-10 mg, 5-10 mg, Oral, Q4H PRN, Melrose Nakayama, MD, 10 mg at 07/24/20 2115 .  senna-docusate (Senokot-S) tablet 1 tablet, 1 tablet, Oral, QHS, Roddenberry, Arlis Porta, PA-C, 1 tablet at 07/25/20 2109 .  traMADol (ULTRAM) tablet 50-100 mg, 50-100 mg, Oral, Q6H PRN, Roddenberry, Myron G, PA-C . acetaminophen  1,000 mg Oral Q6H   Or  . acetaminophen (TYLENOL) oral liquid 160 mg/5 mL  1,000 mg Oral Q6H  . atorvastatin  80 mg Oral QODAY  . bisacodyl  10 mg Oral Daily  . enoxaparin (LOVENOX) injection  40 mg Subcutaneous Daily  . guaiFENesin  1,200 mg Oral BID  . mouth rinse  15 mL Mouth Rinse BID  . senna-docusate  1 tablet Oral QHS     Physical Exam: Blood pressure 131/76, pulse 76, temperature 98.5 F (36.9 C), temperature source Oral, resp. rate 14, height _0  (1.753 m), weight 90.2 kg, SpO2 95 %.    Affect appropriate Elderly jovial male  HEENT: normal Neck supple with no adenopathy JVP normal no bruits no thyromegaly Lungs clear post robotic right upper lobectomy with extensive right flank bruising  Heart:  S1/S2 no murmur, no rub, gallop or click PMI normal Abdomen: benighn, BS positve, no tenderness,   no bruit.  No HSM or HJR post open repair AAA  Distal pulses intact with no bruits No edema Neuro non-focal Skin warm and dry No muscular weakness   Labs:   Lab Results  Component Value Date   WBC 9.7 07/24/2020   HGB 14.0 07/24/2020   HCT 40.9 07/24/2020   MCV 99.8 07/24/2020   PLT 180 07/24/2020     Recent Labs  Lab 07/24/20 0106 07/24/20 0106 07/25/20 0044  NA 134*   < > 134*  K 5.0   < > 4.4  CL 98   < > 98  CO2 29   < > 28  BUN 22   < > 21  CREATININE 1.59*   < > 1.44*  CALCIUM 8.7*   < > 8.6*  PROT 6.1*  --   --   BILITOT 0.8  --   --   ALKPHOS 61  --   --   ALT 14  --   --   AST 19  --   --   GLUCOSE 129*   < > 110*   < > = values in this interval not displayed.     Radiology: DG Chest 2 View  Result Date: 07/22/2020 CLINICAL DATA:  Preop for lung mass resection EXAM: CHEST - 2 VIEW COMPARISON:  07/02/2020 FINDINGS: Right upper lobe opacity anterior to the hilum with opacified airways best seen on the lateral view. There is no edema, consolidation, effusion, or pneumothorax. Normal heart size. Stable aortic tortuosity IMPRESSION: Known right upper lobe lesion.  No acute or interval finding. Electronically Signed   By: Monte Fantasia M.D.   On: 07/22/2020 06:40   NM PET Image Initial (PI) Skull Base To Thigh  Result Date: 06/28/2020 CLINICAL DATA:  Initial treatment strategy for lung nodule. EXAM: NUCLEAR MEDICINE PET SKULL BASE TO THIGH TECHNIQUE: 9.3 mCi F-18 FDG was injected intravenously. Full-ring PET imaging was performed from the skull base to thigh after the radiotracer. CT data was obtained and used for attenuation correction and anatomic localization. Fasting blood glucose: 81 mg/dl COMPARISON:  None. FINDINGS: Mediastinal blood pool activity: SUV max 2.75 Liver activity: SUV max NA NECK: No hypermetabolic lymph nodes in the neck. Incidental CT findings: none CHEST: No right upper lobe central perihilar nodule measures 2.9 x 1.5 cm with SUV max of  14.51. Anteromedial right upper lobe distal airway enlargement appears FDG avid with SUV max of 13.6 concerning for endobronchial spread of tumor. Multiple tiny nodules are identified within the periphery of the anterior right upper lobe as well as the anterior basal right upper lobe. These are too small to reliably  characterize, image 27/8 and image 37/8. Many of these have a tree-in-bud configuration reflecting inflammatory/infectious bronchiolitis. Within the posterior right lower lobe there is a tiny 4 mm nodule, image 49/8. This is too small to reliably characterize. No FDG avid mediastinal, left hilar, or subcarinal lymph nodes. No FDG avid axillary or supraclavicular lymph nodes. Incidental CT findings: Aortic atherosclerosis. Coronary artery calcifications. No pericardial effusion. Emphysema. ABDOMEN/PELVIS: No abnormal FDG uptake within the liver, pancreas, or spleen. Right adrenal gland adenoma measures 1.8 cm. Small low-attenuation nodules within the right adrenal gland are identified with mild FDG uptake. Index left adrenal nodule measures 1.4 x 1.8 cm and -4.55 Hounsfield units compatible with a benign adenoma. Low level FDG uptake within this nodule has an SUV max of 4.3. Smaller adenoma measures 1.2 cm and -3.46 Hounsfield units. This has an SUV max of 4.53. Increased uptake at the level of the GE junction is noted without corresponding soft tissue mass. SUV max is equal to 5.78. No FDG avid abdominopelvic lymph nodes. Incidental CT findings: Signs of previous abdominal aortic aneurysm repair. Aortic atherosclerosis. 3.1 cm right kidney cyst. SKELETON: No focal hypermetabolic activity to suggest skeletal metastasis. Incidental CT findings: none IMPRESSION: 1. Right upper lobe central perihilar lesion is intensely hypermetabolic compatible with primary bronchogenic carcinoma. Signs of endobronchial spread of tumor is identified with FDG avid distended distal airways noted within the anteromedial right upper lobe. 2. No signs of FDG avid mediastinal or contralateral hilar nodal metastasis or distant metastatic disease. 3. Multiple tiny peripheral nodules are identified within the right upper lobe. Many of these have a tree-in-bud configuration and may represent areas of bronchiolitis. Small foci metastasis not  excluded. 4. Bilateral adrenal gland adenomas. 5. Aortic Atherosclerosis (ICD10-I70.0) and Emphysema (ICD10-J43.9). Coronary artery calcifications. Electronically Signed   By: Kerby Moors M.D.   On: 06/28/2020 10:39   DG CHEST PORT 1 VIEW  Result Date: 07/26/2020 CLINICAL DATA:  Status post lobectomy.  Chest tube. EXAM: PORTABLE CHEST 1 VIEW COMPARISON:  07/25/2020. FINDINGS: Postsurgical changes right lung. Right chest tube in stable position. Tiny right apical pneumothorax cannot be excluded on today's exam. Mild atelectasis/infiltrate right lung base. No prominent pleural effusion noted. Left costophrenic angle incompletely imaged. Heart size stable. IMPRESSION: 1. Postsurgical changes right lung. Right chest tube in stable position. Tiny right apical pneumothorax cannot be excluded on today's exam. 2. Mild atelectasis/infiltrate right lung base. Electronically Signed   By: [place]   On: 07/26/2020 05:59   DG CHEST PORT 1 VIEW  Result Date: 07/25/2020 CLINICAL DATA:  Pneumothorax.  Chest tube. EXAM: PORTABLE CHEST 1 VIEW COMPARISON:  07/24/2020. FINDINGS: Right chest tube in stable position. No definite pneumothorax noted. Mild bibasilar atelectasis. Mild right base infiltrate cannot be excluded. Tiny bilateral pleural effusions cannot be excluded. Heart size normal. No acute bony abnormality. IMPRESSION: 1. Right chest tube in stable position. No definite pneumothorax noted. 2. Mild bibasilar atelectasis. Mild right base infiltrate cannot be excluded. Tiny bilateral pleural effusions cannot be excluded. Electronically Signed   By: Marcello Moores  Register   On: 07/25/2020 07:41   DG CHEST PORT 1 VIEW  Result Date: 07/24/2020 CLINICAL DATA:  Postoperative lobectomy with chest  tube present EXAM: PORTABLE CHEST 1 VIEW COMPARISON:  July 23, 2020 FINDINGS: Chest tube is present on the right with tip directed toward the medial right apex. No pneumothorax. Postoperative change with volume loss  noted on the right. There is a minimal left pleural effusion with mild left base atelectasis. Lungs elsewhere clear. Heart size is normal. Pulmonary vascularity is normal. No adenopathy. There is aortic atherosclerosis. No bone lesions. IMPRESSION: Chest tube as described on the right without pneumothorax. Postoperative change with volume loss on the right, stable. Mild left base atelectasis with small left pleural effusion. No new opacity evident. Stable cardiac silhouette. Aortic Atherosclerosis (ICD10-I70.0). Electronically Signed   By: Lowella Grip III M.D.   On: 07/24/2020 08:21   DG CHEST PORT 1 VIEW  Result Date: 07/23/2020 CLINICAL DATA:  Postop lobectomy EXAM: PORTABLE CHEST 1 VIEW COMPARISON:  07/22/2020 FINDINGS: Right chest tube has been advanced into the right upper lobe. There is lucency in the right apex consistent with pneumothorax. Pleural line is obscured by the overlying chest tube. No right effusion. Small left pleural effusion and left lower lobe atelectasis. Negative for heart failure or edema. IMPRESSION: Right apical pneumothorax. Chest tube has been advanced into the right apex. Electronically Signed   By: Franchot Gallo M.D.   On: 07/23/2020 07:56   DG Chest Port 1 View  Result Date: 07/22/2020 CLINICAL DATA:  69 year old male with a history right VATS lobectomy EXAM: PORTABLE CHEST 1 VIEW COMPARISON:  07/22/2020 FINDINGS: Cardiomediastinal silhouette unchanged in size and contour, accentuated by the low lung volumes. Interval surgical changes of the right chest with right thoracostomy tube terminating in the medial right chest. No pneumothorax. Linear opacities in the hilar region, compatible with atelectasis/postsurgical changes. No evidence of left-sided pneumothorax or confluent airspace disease. Blunting at the left costophrenic angle may represent trace pleural fluid and/or atelectasis. IMPRESSION: Expected postsurgical changes, with right-sided thoracostomy tube and no  visualized pneumothorax. Electronically Signed   By: Corrie Mckusick D.O.   On: 07/22/2020 13:16   DG CHEST PORT 1 VIEW  Result Date: 07/02/2020 CLINICAL DATA:  Status post bronchoscopy EXAM: PORTABLE CHEST 1 VIEW COMPARISON:  Chest radiograph June 29, 2017; PET-CT June 27, 2020 FINDINGS: No pneumothorax. Soft tissue prominence is noted in the superior right hilar region at the site of metabolic active lesion seen on PET study. No edema or airspace opacity. Heart size and pulmonary vascularity are normal. There is aortic atherosclerosis. No bone lesions. IMPRESSION: No pneumothorax. Mass in the right suprahilar region, better seen on recent PET-CT examination. No edema or airspace opacity. Cardiac silhouette within normal limits. Aortic Atherosclerosis (ICD10-I70.0). Electronically Signed   By: Lowella Grip III M.D.   On: 07/02/2020 15:34    EKG: SR rate 63 nonspecific ST changes PAC no acute findings    ASSESSMENT AND PLAN:    1. Arrhythmia:  No significant AV block or VT. Baseline ECG ok and asymptomatic PAC;s currently ok to resume home dose of coreg 3.125 bid  2. CAD:  Presumed with abnormal myovue 2018 asymptomatic no cath pursued Consider f/u cardiac CTA as outpatient  3. DCM:  Resolved EF normal by last echo no signs of volume overload can have updated echo as outpatient  3. HTN  Resume home zestril  4. HLD:  Continue statin given vascular disease and AAA   Ok to d/c in am unless telemetry reveals something else over next 24 hours   Signed: Jenkins Rouge 07/26/2020, 11:53 AM

## 2020-07-26 NOTE — Progress Notes (Signed)
4 Days Post-Op Procedure(s) (LRB): XI ROBOTIC ASSISTED THORASCOPY- RIGHT UPPER LOBECTOMY (Right) INTERCOSTAL NERVE BLOCK (Right) NODE DISSECTION (Right) Subjective: Pain better, appetite ok Episode of bradycardia last night  Objective: Vital signs in last 24 hours: Temp:  [98.2 F (36.8 C)-98.6 F (37 C)] 98.3 F (36.8 C) (11/19 0258) Pulse Rate:  [60-82] 67 (11/19 0258) Cardiac Rhythm: Normal sinus rhythm;Heart block (11/18 1900) Resp:  [14-22] 20 (11/19 0258) BP: (116-146)/(56-85) 134/85 (11/19 0258) SpO2:  [93 %-95 %] 93 % (11/19 0258)  Hemodynamic parameters for last 24 hours:    Intake/Output from previous day: 11/18 0701 - 11/19 0700 In: 840 [P.O.:840] Out: 1750 [Urine:1600; Chest Tube:150] Intake/Output this shift: No intake/output data recorded.  General appearance: alert, cooperative and no distress Neurologic: intact Heart: slightly irregular Lungs: diminished breath sounds right base Wound: clean and dry, R flank ecchymosis no air leak  Lab Results: Recent Labs    07/24/20 0106  WBC 9.7  HGB 14.0  HCT 40.9  PLT 180   BMET:  Recent Labs    07/24/20 0106 07/25/20 0044  NA 134* 134*  K 5.0 4.4  CL 98 98  CO2 29 28  GLUCOSE 129* 110*  BUN 22 21  CREATININE 1.59* 1.44*  CALCIUM 8.7* 8.6*    PT/INR: No results for input(s): LABPROT, INR in the last 72 hours. ABG    Component Value Date/Time   PHART 7.400 07/23/2020 0438   HCO3 25.7 07/23/2020 0438   TCO2 31 07/22/2020 1015   ACIDBASEDEF 1.0 07/22/2020 1015   O2SAT 95.6 07/23/2020 0438   CBG (last 3)  No results for input(s): GLUCAP in the last 72 hours.  Assessment/Plan: S/P Procedure(s) (LRB): XI ROBOTIC ASSISTED THORASCOPY- RIGHT UPPER LOBECTOMY (Right) INTERCOSTAL NERVE BLOCK (Right) NODE DISSECTION (Right) -POD # 4 No air leak- dc chest tube Still on 1L [place]- wean as tolerated Pain well controlled Episode of bradycardia around 11 PM documented in nursing note but I don't see any  on alarm review on tele. Did have some episodes of possible VT although very noisy strips. Does have a history of CAD, so will ask Cardiology to weigh in    LOS: 4 days    Melrose Nakayama 07/26/2020

## 2020-07-26 NOTE — Care Management Important Message (Signed)
Important Message  Patient Details  Name: Nicolas Butler MRN: [id number] Date of Birth: [birth year]   Medicare Important Message Given:  Yes     Nicolas Butler 07/26/2020, 3:17 PM

## 2020-07-26 NOTE — Plan of Care (Signed)
  Problem: Education: Goal: Knowledge of General Education information will improve Description: Including pain rating scale, medication(s)/side effects and non-pharmacologic comfort measures Outcome: Progressing   Problem: Clinical Measurements: Goal: Ability to maintain clinical measurements within normal limits will improve Outcome: Progressing Goal: Will remain free from infection Outcome: Progressing   

## 2020-07-27 ENCOUNTER — Inpatient Hospital Stay (HOSPITAL_COMMUNITY): Payer: PPO

## 2020-07-27 MED ORDER — TRAMADOL HCL 50 MG PO TABS
50.0000 mg | ORAL_TABLET | Freq: Four times a day (QID) | ORAL | 0 refills | Status: DC | PRN
Start: 2020-07-27 — End: 2020-09-02

## 2020-07-27 NOTE — Plan of Care (Signed)

## 2020-07-27 NOTE — Discharge Instructions (Signed)

## 2020-07-27 NOTE — Progress Notes (Signed)
Subjective:  Denies SSCP, palpitations or Dyspnea Home today   Objective:  Vitals:   07/26/20 2319 07/27/20 0313 07/27/20 0612 07/27/20 0730  BP: 118/81 95/72 129/67 100/66  Pulse: 73 67 66 81  Resp: 20 16  18   Temp: 98.7 F (37.1 C) 98.4 F (36.9 C)  99 F (37.2 C)  TempSrc: Oral Oral  Oral  SpO2: 94% 93%  93%  Weight:      Height:        Intake/Output from previous day:  Intake/Output Summary (Last 24 hours) at 07/27/2020 0807 Last data filed at 07/27/2020 0800 Gross per 24 hour  Intake 600 ml  Output 1500 ml  Net -900 ml    Physical Exam: Post right thoracotomy Bruising right flank No murmur  Lungs clear No edema   Lab Results: Basic Metabolic Panel: Recent Labs    07/25/20 0044  NA 134*  K 4.4  CL 98  CO2 28  GLUCOSE 110*  BUN 21  CREATININE 1.44*  CALCIUM 8.6*   Liver Function Tests: No results for input(s): AST, ALT, ALKPHOS, BILITOT, PROT, ALBUMIN in the last 72 hours. No results for input(s): LIPASE, AMYLASE in the last 72 hours. CBC: No results for input(s): WBC, NEUTROABS, HGB, HCT, MCV, PLT in the last 72 hours. Cardiac Enzymes:  Imaging: DG Chest 1V REPEAT Same Day  Result Date: 07/26/2020 CLINICAL DATA:  Chest tube removal EXAM: CHEST - 1 VIEW SAME DAY COMPARISON:  July 26, 2020 FINDINGS: The right chest tube has been removed. The right apical pneumothorax is larger in the interval measuring 4.2 cm on this study versus 2.5 cm earlier today. Postsurgical changes again seen in the right lung, unchanged. Cardiomediastinal silhouette is stable. The left lung remains clear. No other acute abnormalities. IMPRESSION: 1. The right chest tube has been removed. The right apical pneumothorax is larger in the interval measuring 4.2 cm on this study versus 2.5 cm prior to chest tube removal. Recommend clinical correlation and attention on follow-up. 2. No other changes. These results will be called to the ordering clinician or representative by  the Radiologist Assistant, and communication documented in the PACS or [employer]. Electronically Signed   By: Dorise Bullion III M.D   On: 07/26/2020 14:55   DG CHEST PORT 1 VIEW  Result Date: 07/26/2020 CLINICAL DATA:  Status post lobectomy.  Chest tube. EXAM: PORTABLE CHEST 1 VIEW COMPARISON:  07/25/2020. FINDINGS: Postsurgical changes right lung. Right chest tube in stable position. Tiny right apical pneumothorax cannot be excluded on today's exam. Mild atelectasis/infiltrate right lung base. No prominent pleural effusion noted. Left costophrenic angle incompletely imaged. Heart size stable. IMPRESSION: 1. Postsurgical changes right lung. Right chest tube in stable position. Tiny right apical pneumothorax cannot be excluded on today's exam. 2. Mild atelectasis/infiltrate right lung base. Electronically Signed   By: Marcello Moores  Register   On: 07/26/2020 05:59    Cardiac Studies:  ECG: NSR PAC with aberrancy    Telemetry:  NSR   Echo: EF 60-65% 04/29/17  Medications:   . acetaminophen  1,000 mg Oral Q6H   Or  . acetaminophen (TYLENOL) oral liquid 160 mg/5 mL  1,000 mg Oral Q6H  . atorvastatin  80 mg Oral QODAY  . bisacodyl  10 mg Oral Daily  . carvedilol  3.125 mg Oral BID WC  . enoxaparin (LOVENOX) injection  40 mg Subcutaneous Daily  . guaiFENesin  1,200 mg Oral BID  . lisinopril  20 mg Oral Daily  .  mouth rinse  15 mL Mouth Rinse BID  . senna-docusate  1 tablet Oral QHS      Assessment/Plan:   1. 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

## 2020-07-27 NOTE — Progress Notes (Signed)
      [address]       [employer],[place] [address]             [phone]        5 Days Post-Op Procedure(s) (LRB): XI ROBOTIC ASSISTED THORASCOPY- RIGHT UPPER LOBECTOMY (Right) INTERCOSTAL NERVE BLOCK (Right) NODE DISSECTION (Right) Subjective:  Feels good, no complaints.  The chest tube was removed yesterday and his respiratory status is remained stable.  He is on room air.  Denies shortness of breath  Objective: Vital signs in last 24 hours: Temp:  [98.2 F (36.8 C)-99 F (37.2 C)] 98.8 F (37.1 C) (11/20 1112) Pulse Rate:  [66-81] 67 (11/20 1112) Cardiac Rhythm: Heart block (11/20 0721) Resp:  [15-20] 20 (11/20 1112) BP: (95-129)/(65-85) 123/65 (11/20 1112) SpO2:  [91 %-95 %] 95 % (11/20 1112)     Intake/Output from previous day: 11/19 0701 - 11/20 0700 In: 720 [P.O.:720] Out: 1500 [Urine:1500] Intake/Output this shift: Total I/O In: 120 [P.O.:120] Out: 400 [Urine:400]  General appearance: alert, cooperative and no distress Neurologic: intact Heart: Sinus rhythm with a few PACs Lungs: diminished breath sounds right base Wound: clean and dry, R flank ecchymosis no air leak.  Mild erythema at port sites.  No drainage.   Lab Results: No results for input(s): WBC, HGB, HCT, PLT in the last 72 hours. BMET:  Recent Labs    07/25/20 0044  NA 134*  K 4.4  CL 98  CO2 28  GLUCOSE 110*  BUN 21  CREATININE 1.44*  CALCIUM 8.6*    PT/INR: No results for input(s): LABPROT, INR in the last 72 hours. ABG    Component Value Date/Time   PHART 7.400 07/23/2020 0438   HCO3 25.7 07/23/2020 0438   TCO2 31 07/22/2020 1015   ACIDBASEDEF 1.0 07/22/2020 1015   O2SAT 95.6 07/23/2020 0438   CBG (last 3)  No results for input(s): GLUCAP in the last 72 hours.  Assessment/Plan: S/P Procedure(s) (LRB): XI ROBOTIC ASSISTED THORASCOPY- RIGHT UPPER LOBECTOMY (Right) INTERCOSTAL NERVE BLOCK (Right) NODE DISSECTION (Right)   -POD5 robotic assisted right  upper lobectomy for stage IA poorly differentiated adenocarcinoma.  Chest tube removed yesterday.  Chest x-ray shows small expected right apical airspace.  -Bradycardia-isolated episode yesterday morning.  He was evaluated by cardiology.  He has been cleared to continue with the carvedilol.  No further work-up was recommended.  -Stage IIIA chronic renal insufficiency, creatinine near baseline of 1.4  -History of HTN- BP control remains adequate.  Will continue low-dose carvedilol but hold off on restarting amlodipine and ACE-I.   -Disposition-discharged home today.  He will follow-up with Dr. Roxan Hockey in about 3 weeks.  We will arrange for him to come the office in 1 week for suture removal  LOS: 5 days    Antony Odea, PA-C [phone] 07/27/2020

## 2020-07-27 NOTE — Progress Notes (Signed)
Pt got discharged to home, discharge instructions provided and patient showed understanding to it, IV taken out,Telemonitor DC,pt left unit in wheelchair with all of the belongings accompanied with a family member (Son)  [place], Therapist, sports

## 2020-08-08 ENCOUNTER — Other Ambulatory Visit: Payer: Self-pay | Admitting: *Deleted

## 2020-08-08 NOTE — Progress Notes (Signed)
The proposed treatment discussed in cancer conference 12/2 is for discussion purpose only and is not a binding recommendation.  The patient was not physically examined nor present for their treatment options.  Therefore, final treatment plans cannot be decided.

## 2020-08-12 ENCOUNTER — Other Ambulatory Visit: Payer: Self-pay | Admitting: Thoracic Surgery (Cardiothoracic Vascular Surgery)

## 2020-08-12 DIAGNOSIS — C3411 Malignant neoplasm of upper lobe, right bronchus or lung: Secondary | ICD-10-CM

## 2020-08-13 ENCOUNTER — Ambulatory Visit
Admission: RE | Admit: 2020-08-13 | Discharge: 2020-08-13 | Disposition: A | Discharge status: Home / Self Care | Payer: PPO | Source: Ambulatory Visit | Attending: Thoracic Surgery (Cardiothoracic Vascular Surgery) | Admitting: Thoracic Surgery (Cardiothoracic Vascular Surgery)

## 2020-08-13 ENCOUNTER — Other Ambulatory Visit: Payer: Self-pay

## 2020-08-13 ENCOUNTER — Ambulatory Visit (INDEPENDENT_AMBULATORY_CARE_PROVIDER_SITE_OTHER): Payer: Self-pay | Admitting: Thoracic Surgery (Cardiothoracic Vascular Surgery)

## 2020-08-13 VITALS — BP 135/80 | HR 62 | Temp 97.6°F | Resp 20 | Ht 69.0 in | Wt 196.0 lb

## 2020-08-13 DIAGNOSIS — R918 Other nonspecific abnormal finding of lung field: Secondary | ICD-10-CM

## 2020-08-13 DIAGNOSIS — J90 Pleural effusion, not elsewhere classified: Secondary | ICD-10-CM | POA: Diagnosis not present

## 2020-08-13 DIAGNOSIS — C3411 Malignant neoplasm of upper lobe, right bronchus or lung: Secondary | ICD-10-CM

## 2020-08-13 DIAGNOSIS — Z09 Encounter for follow-up examination after completed treatment for conditions other than malignant neoplasm: Secondary | ICD-10-CM

## 2020-08-13 NOTE — Progress Notes (Signed)
[address]       [place] [address]             [phone]       HPI: Mr. Seitzinger returns for scheduled follow-up visit  Ovadia Lopp is a 69 year old gentleman with a history of tobacco abuse, hypertension, hyperlipidemia, systolic heart failure, stage III chronic kidney disease, and abdominal aortic aneurysm.  He quit smoking in 2014.  He recently had a low-dose screening CT which showed a right upper lobe lung mass.  On PET CT the mass was hypermetabolic.  Dr. Shearon Stalls did bronchoscopy which showed a poorly differentiated carcinoma.  He underwent robotic right upper lobectomy on 07/22/2020.  He did well postoperatively and went home on day 5.  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  Since discharge he has been doing well.  He is not taking any narcotics.  He is not having any respiratory issues.  He is anxious to resume full activities.  Past Medical History:  Diagnosis Date  . AAA (abdominal aortic aneurysm) ([facility])   . CHF (congestive heart failure) ([facility])   . Chronic kidney disease (CKD), stage III (moderate) (HCC)   . Complication of anesthesia    Bowel and bladder are slow to wake up after surgery  . Coronary artery disease    presumed  . Dyspnea    exertion  . High cholesterol   . Unspecified essential hypertension     Current Outpatient Medications  Medication Sig Dispense Refill  . albuterol (PROVENTIL) (2.5 MG/3ML) 0.083% nebulizer solution Take 2.5 mg by nebulization every 6 (six) hours as needed for wheezing or shortness of breath.     Marland Kitchen atorvastatin (LIPITOR) 80 MG tablet Take 80 mg by mouth every other day. AT NIGHT    . carvedilol (COREG) 3.125 MG tablet Take 1 tablet (3.125 mg total) by mouth 2 (two) times daily. 60 tablet 6  . Cholecalciferol (VITAMIN D3) 5000 units CAPS Take 10,000 Units by mouth daily after breakfast.     . lisinopril (PRINIVIL,ZESTRIL) 40 MG tablet Take 1 tablet (40 mg total) by mouth daily. 30  tablet 6  . traMADol (ULTRAM) 50 MG tablet Take 1-2 tablets (50-100 mg total) by mouth every 6 (six) hours as needed (mild pain). 20 tablet 0   No current facility-administered medications for this visit.    Physical Exam BP 135/80   Pulse 62   Temp 97.6 F (36.4 C) (Skin)   Resp 20   Ht 5\' 9"  (1.753 m)   Wt 196 lb (88.9 kg)   SpO2 93% Comment: RA  BMI 28.18 kg/m  70 year old man in no acute distress Alert and oriented x3 with no focal deficits Lungs diminished at right base, otherwise clear Cardiac regular rate and rhythm Incisions well-healed  Diagnostic Tests: CHEST - 2 VIEW  COMPARISON:  July 27, 2020.  FINDINGS: The heart size and mediastinal contours are within normal limits. No pneumothorax is noted. Left lung is clear. Small right pleural effusion is noted with associated right basilar atelectasis or scarring. The visualized skeletal structures are unremarkable.  IMPRESSION: Small right pleural effusion with associated right basilar atelectasis or scarring.   Electronically Signed   By: Marijo Conception M.D.   On: 08/13/2020 10:36  I personally reviewed the chest x-ray images and concur with the findings as noted above  Impression: Kani Chauvin is a 69 year old former smoker who was found to have a right upper  lobe lung mass on a low-dose CT of the chest.  That mass was hypermetabolic on PET/CT with no evidence of regional or distant metastatic disease.  He underwent a robotic right upper lobectomy and node dissection on 07/22/2020.  The mass turned out to be a stage IIb (T3, N0) squamous cell carcinoma.  Surgical standpoint he is doing extremely well.  He is having minimal discomfort.  He is not taking any narcotics.  He has been gradually increasing his activities.  I encouraged him to try to walk more.  He needs to see an oncologist to consider adjuvant chemotherapy.  He prefers to go to [facility] so I am going to refer him to Dr. Delton Coombes.   Plan: Referral to Dr. Delton Coombes of oncology Return in 2 months with PA lateral chest x-ray  Melrose Nakayama, MD [facility] [phone]

## 2020-08-15 ENCOUNTER — Ambulatory Visit (HOSPITAL_COMMUNITY): Payer: PPO | Admitting: Hematology and Oncology

## 2020-08-16 LAB — ACID FAST CULTURE WITH REFLEXED SENSITIVITIES (MYCOBACTERIA): Acid Fast Culture: NEGATIVE

## 2020-08-20 ENCOUNTER — Encounter: Payer: Self-pay | Admitting: Internal Medicine

## 2020-08-20 ENCOUNTER — Ambulatory Visit (HOSPITAL_COMMUNITY): Payer: PPO | Admitting: Hematology

## 2020-08-20 ENCOUNTER — Telehealth: Payer: Self-pay | Admitting: Internal Medicine

## 2020-08-20 ENCOUNTER — Ambulatory Visit (INDEPENDENT_AMBULATORY_CARE_PROVIDER_SITE_OTHER): Payer: PPO | Admitting: Internal Medicine

## 2020-08-20 DIAGNOSIS — J441 Chronic obstructive pulmonary disease with (acute) exacerbation: Secondary | ICD-10-CM | POA: Diagnosis not present

## 2020-08-20 DIAGNOSIS — R0602 Shortness of breath: Secondary | ICD-10-CM

## 2020-08-20 MED ORDER — PREDNISONE 20 MG PO TABS: 40.0000 mg | ORAL_TABLET | Freq: Every day | ORAL | 0 refills | Status: DC

## 2020-08-20 NOTE — Progress Notes (Addendum)
Nicolas Butler    [id number]    1951/01/17  Primary Care Physician:Boyd, Grace Bushy, PA Date of Appointment: 08/20/2020 Established Patient Visit  Chief complaint:  Short of breath  I connected with@ on 08/20/2020 by telephone  and verified that I am speaking with the correct person using two identifiers. Patient is at home, Physician is in office.    I discussed the limitations of evaluation and management by telemedicine. The patient expressed understanding and agreed to proceed.   HPI: Nicolas Butler is a 69 y.o. gentleman with COPD and a right upper lobe lung mass. 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 Roxan Hockey on 07/22/2020.   Interval Updates: Feeling short of breath since the weekend. Had socialized with family and has congestion, chest tightness, wheezing. No fevers or chills. No sputum production or worsening cough. Appetite is good.   I have reviewed the patient's family social and past medical history and updated as appropriate.   Past Medical History:  Diagnosis Date  . AAA (abdominal aortic aneurysm) ([facility])   . CHF (congestive heart failure) ([facility])   . Chronic kidney disease (CKD), stage III (moderate) (HCC)   . Complication of anesthesia    Bowel and bladder are slow to wake up after surgery  . Coronary artery disease    presumed  . Dyspnea    exertion  . High cholesterol   . Unspecified essential hypertension     Past Surgical History:  Procedure Laterality Date  . ABDOMINAL AORTIC ANEURYSM REPAIR N/A 06/28/2017   Procedure: ANEURYSM ABDOMINAL AORTIC REPAIR OPEN;  Surgeon: Elam Dutch, MD;  Location: [facility];  Service: Vascular;  Laterality: N/A;  . BRONCHIAL BRUSHINGS  07/02/2020   Procedure: BRONCHIAL BRUSHINGS;  Surgeon: Spero Geralds, MD;  Location: [facility];  Service: Pulmonary;;  . BRONCHIAL WASHINGS  07/02/2020   Procedure: BRONCHIAL WASHINGS;  Surgeon: Spero Geralds, MD;   Location: [facility];  Service: Pulmonary;;  BAL and washings  . HAND SURGERY Left   . HERNIA REPAIR    . INCISIONAL HERNIA REPAIR     LEFT  . INTERCOSTAL NERVE BLOCK Right 07/22/2020   Procedure: INTERCOSTAL NERVE BLOCK;  Surgeon: Melrose Nakayama, MD;  Location: [facility];  Service: Thoracic;  Laterality: Right;  . IRRIGATION AND DEBRIDEMENT SEBACEOUS CYST     FROM RIGHT SHOULDER  . LUNG BIOPSY  07/02/2020   Procedure: LUNG BIOPSY;  Surgeon: Spero Geralds, MD;  Location: [facility];  Service: Pulmonary;;  . NODE DISSECTION Right 07/22/2020   Procedure: NODE DISSECTION;  Surgeon: Melrose Nakayama, MD;  Location: [facility];  Service: Thoracic;  Laterality: Right;  . UMBILICAL HERNIA REPAIR    . VIDEO BRONCHOSCOPY Right 07/02/2020   Procedure: VIDEO BRONCHOSCOPY WITHOUT FLUORO;  Surgeon: Spero Geralds, MD;  Location: [facility];  Service: Pulmonary;  Laterality: Right;    Family History  Problem Relation Age of Onset  . Pneumonia Mother   . Other Father        brain tumor  . Diabetes Son   . Lung cancer Neg Hx     Social History   Occupational History  . Not on file  Tobacco Use  . Smoking status: Former Smoker    Packs/day: 1.50    Years: 46.00    Pack years: 69.00    Types: Cigarettes    Start date: 05/17/1965    Quit date:  12/08/2012    Years since quitting: 7.7  . Smokeless tobacco: Never Used  Vaping Use  . Vaping Use: Never used  Substance and Sexual Activity  . Alcohol use: No    Alcohol/week: 0.0 standard drinks  . Drug use: No  . Sexual activity: Not on file     Physical Exam: No vitals due to nature of visit.  No audible wheezing Able to speak in full sentences  Data Reviewed: Imaging: I have personally reviewed the chest xray 12/7 which shows right pleural effusion with atelectasis.   PFTs:  PFT Results Latest Ref Rng & Units 07/19/2020  FVC-Pre L 3.43  FVC-Predicted Pre % 79  FVC-Post L 3.93  FVC-Predicted Post % 91  Pre FEV1/FVC  % % 52  Post FEV1/FCV % % 52  FEV1-Pre L 1.79  FEV1-Predicted Pre % 56  FEV1-Post L 2.05  DLCO uncorrected ml/min/mmHg 23.12  DLCO UNC% % 91  DLCO corrected ml/min/mmHg 22.34  DLCO COR %Predicted % 88  DLVA Predicted % 101  TLC L 9.14  TLC % Predicted % 133  RV % Predicted % 239   I have personally reviewed the patient's PFTs and show moderately severe airflow limitation.   Labs:  Immunization status: Immunization History  Administered Date(s) Administered  . Tdap 01/21/2016    Assessment:  COPD with acute exacerbation Stage 2B lung cancer ([facility]) s/p RUL lobectomy Nov 2021.    Plan/Recommendations: Prednisone 40 mg x 5 days.  Continue inhaler therapies and increase albuterol use as needed up to 4 times/day.  If not feeling better by Friday should let us know.   Return to Care: Return in about 4 weeks (around 09/17/2020).  I spent 21 minutes in the care of this patient today including pre-charting, chart review, review of results, telephone care, coordination of care and communication with consultants etc.).    Lenice Llamas, MD [facility] [place]

## 2020-08-20 NOTE — Telephone Encounter (Signed)
Can we get him in for a sick visit with a chest xray before hand? If he is short of breath I recommend he takes his albuterol inhaler more frequently if he is sob.  If he can't come down to the office, can we do a televisit?

## 2020-08-20 NOTE — Telephone Encounter (Signed)
Called and spoke with patient who states " I can't keep my breath" that started Sunday. Patient states that his oxygen has been dropping to 88% with exertion. Has been using 2 liters as needed. States this is the first time he has used his oxygen in 5 years. Denies fevers, productive cough with clear sputum. Slept with oxygen on all night last night. Patient had a Lobectomy on 11/15. States he has felt fine since procedure and has had no issues, he's not sure what happened Sunday to start this.   Dr. Shearon Stalls please advise

## 2020-08-20 NOTE — Telephone Encounter (Signed)
Called spoke with patient.  Chest xray order already placed by Dr. Shearon Stalls Patient will come in at 3:40 to get xray done before appointment with Dr. Shearon Stalls at 3:45  Nothing further needed at this time.

## 2020-09-02 ENCOUNTER — Ambulatory Visit (HOSPITAL_COMMUNITY): Payer: PPO | Admitting: Hematology

## 2020-09-02 ENCOUNTER — Encounter (HOSPITAL_COMMUNITY): Payer: Self-pay

## 2020-09-02 ENCOUNTER — Other Ambulatory Visit: Payer: Self-pay

## 2020-09-02 ENCOUNTER — Inpatient Hospital Stay (HOSPITAL_COMMUNITY): Payer: PPO | Attending: Hematology | Admitting: Hematology

## 2020-09-02 ENCOUNTER — Inpatient Hospital Stay (HOSPITAL_COMMUNITY): Payer: PPO

## 2020-09-02 ENCOUNTER — Encounter (HOSPITAL_COMMUNITY): Payer: Self-pay | Admitting: Hematology

## 2020-09-02 VITALS — BP 141/82 | HR 69 | Temp 97.3°F | Resp 19 | Ht 69.0 in | Wt 195.2 lb

## 2020-09-02 DIAGNOSIS — C3491 Malignant neoplasm of unspecified part of right bronchus or lung: Secondary | ICD-10-CM

## 2020-09-02 DIAGNOSIS — Z833 Family history of diabetes mellitus: Secondary | ICD-10-CM | POA: Diagnosis not present

## 2020-09-02 DIAGNOSIS — J449 Chronic obstructive pulmonary disease, unspecified: Secondary | ICD-10-CM | POA: Diagnosis not present

## 2020-09-02 DIAGNOSIS — R0602 Shortness of breath: Secondary | ICD-10-CM | POA: Insufficient documentation

## 2020-09-02 DIAGNOSIS — I2510 Atherosclerotic heart disease of native coronary artery without angina pectoris: Secondary | ICD-10-CM | POA: Insufficient documentation

## 2020-09-02 DIAGNOSIS — Z79899 Other long term (current) drug therapy: Secondary | ICD-10-CM | POA: Diagnosis not present

## 2020-09-02 DIAGNOSIS — Z808 Family history of malignant neoplasm of other organs or systems: Secondary | ICD-10-CM | POA: Diagnosis not present

## 2020-09-02 DIAGNOSIS — C3411 Malignant neoplasm of upper lobe, right bronchus or lung: Secondary | ICD-10-CM | POA: Insufficient documentation

## 2020-09-02 DIAGNOSIS — D3501 Benign neoplasm of right adrenal gland: Secondary | ICD-10-CM | POA: Diagnosis not present

## 2020-09-02 DIAGNOSIS — Z87891 Personal history of nicotine dependence: Secondary | ICD-10-CM | POA: Insufficient documentation

## 2020-09-02 DIAGNOSIS — R5383 Other fatigue: Secondary | ICD-10-CM | POA: Insufficient documentation

## 2020-09-02 DIAGNOSIS — Z836 Family history of other diseases of the respiratory system: Secondary | ICD-10-CM | POA: Diagnosis not present

## 2020-09-02 DIAGNOSIS — I10 Essential (primary) hypertension: Secondary | ICD-10-CM | POA: Diagnosis not present

## 2020-09-02 LAB — CBC WITH DIFFERENTIAL/PLATELET
Abs Immature Granulocytes: 0.02 10*3/uL (ref 0.00–0.07)
Basophils Absolute: 0.1 10*3/uL (ref 0.0–0.1)
Basophils Relative: 1 %
Eosinophils Absolute: 0.2 10*3/uL (ref 0.0–0.5)
Eosinophils Relative: 2 %
HCT: 46.9 % (ref 39.0–52.0)
Hemoglobin: 15.9 g/dL (ref 13.0–17.0)
Immature Granulocytes: 0 %
Lymphocytes Relative: 22 %
Lymphs Abs: 1.9 10*3/uL (ref 0.7–4.0)
MCH: 33.7 pg (ref 26.0–34.0)
MCHC: 33.9 g/dL (ref 30.0–36.0)
MCV: 99.4 fL (ref 80.0–100.0)
Monocytes Absolute: 0.9 10*3/uL (ref 0.1–1.0)
Monocytes Relative: 10 %
Neutro Abs: 5.6 10*3/uL (ref 1.7–7.7)
Neutrophils Relative %: 65 %
Platelets: 234 10*3/uL (ref 150–400)
RBC: 4.72 MIL/uL (ref 4.22–5.81)
RDW: 12.7 % (ref 11.5–15.5)
WBC: 8.7 10*3/uL (ref 4.0–10.5)
nRBC: 0 % (ref 0.0–0.2)

## 2020-09-02 LAB — COMPREHENSIVE METABOLIC PANEL
ALT: 21 U/L (ref 0–44)
AST: 15 U/L (ref 15–41)
Albumin: 4 g/dL (ref 3.5–5.0)
Alkaline Phosphatase: 100 U/L (ref 38–126)
Anion gap: 8 (ref 5–15)
BUN: 21 mg/dL (ref 8–23)
CO2: 27 mmol/L (ref 22–32)
Calcium: 9.3 mg/dL (ref 8.9–10.3)
Chloride: 101 mmol/L (ref 98–111)
Creatinine, Ser: 1.35 mg/dL — ABNORMAL HIGH (ref 0.61–1.24)
GFR, Estimated: 57 mL/min — ABNORMAL LOW (ref >60)
Glucose, Bld: 90 mg/dL (ref 70–99)
Potassium: 3.9 mmol/L (ref 3.5–5.1)
Sodium: 136 mmol/L (ref 135–145)
Total Bilirubin: 0.7 mg/dL (ref 0.3–1.2)
Total Protein: 7.8 g/dL (ref 6.5–8.1)

## 2020-09-02 MED ORDER — OCTREOTIDE ACETATE 30 MG IM KIT
PACK | INTRAMUSCULAR | Status: AC
  Filled 2020-09-02: qty 1

## 2020-09-02 NOTE — Progress Notes (Signed)
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 69 y.o. male is here because of evaluation of right upper lobe squamous cell carcinoma, at the request of Dr. Modesto Charon. He had a robotic assisted right upper lobectomy on 11/15.  Today he is accompanied by his daughter, Nicolas Butler, and he reports feeling well since the procedure. He had a bout of COPD a couple weeks back and it hindered his recovery; he is approximately at 75%. He is not tolerating walking as much as before, though he was able to walk up to the cancer center from the parking lot; he was regularly walking 1 mile almost daily before the surgery. He reports that his cancer was detected on routine CT screening and he was referred to [facility]. He denies having weight loss, numbness or tingling, and his appetite is good. He denies having MI's or CVA's. He reports that his surgical sites have healed up nicely and are not painful. He is not receptive to going through chemo at the moment mainly due to the fatigue.  He lives at home by himself and he is able to do his chores and ADL's. He used to work in the [employer]. He quit smoking in 2014. His brother had some kind of cancer which metastasized by the time it was discovered; his maternal cousin had bone cancer as a teenager.   MEDICAL HISTORY:  Past Medical History:  Diagnosis Date  . AAA (abdominal aortic aneurysm) ([place])   . CHF (congestive heart failure) ([place])   . Chronic kidney disease (CKD), stage III (moderate) (HCC)   . Complication of anesthesia    Bowel and bladder are slow to wake up after surgery  . COPD (chronic obstructive  pulmonary disease) ([place])   . Coronary artery disease    presumed  . Dyspnea    exertion  . High cholesterol   . Lung cancer ([place])   . Unspecified essential hypertension     SURGICAL HISTORY: Past Surgical History:  Procedure Laterality Date  . ABDOMINAL AORTIC ANEURYSM REPAIR N/A 06/28/2017   Procedure: ANEURYSM ABDOMINAL AORTIC REPAIR OPEN;  Surgeon: Elam Dutch, MD;  Location: [place];  Service: Vascular;  Laterality: N/A;  . BRONCHIAL BRUSHINGS  07/02/2020   Procedure: BRONCHIAL BRUSHINGS;  Surgeon: Spero Geralds, MD;  Location: [facility];  Service: Pulmonary;;  . BRONCHIAL WASHINGS  07/02/2020   Procedure: BRONCHIAL WASHINGS;  Surgeon: Spero Geralds, MD;  Location: [facility];  Service: Pulmonary;;  BAL and washings  . HAND SURGERY Left   . HERNIA REPAIR    . INCISIONAL HERNIA REPAIR     LEFT  . INTERCOSTAL NERVE BLOCK Right 07/22/2020   Procedure: INTERCOSTAL NERVE BLOCK;  Surgeon: Melrose Nakayama, MD;  Location: [place];  Service: Thoracic;  Laterality: Right;  . IRRIGATION AND DEBRIDEMENT SEBACEOUS CYST     FROM RIGHT SHOULDER  . LUNG BIOPSY  07/02/2020   Procedure: LUNG BIOPSY;  Surgeon: Spero Geralds, MD;  Location: [facility];  Service: Pulmonary;;  . NODE DISSECTION Right 07/22/2020   Procedure: NODE DISSECTION;  Surgeon: Melrose Nakayama, MD;  Location: [place];  Service: Thoracic;  Laterality: Right;  .  UMBILICAL HERNIA REPAIR    . VIDEO BRONCHOSCOPY Right 07/02/2020   Procedure: VIDEO BRONCHOSCOPY WITHOUT FLUORO;  Surgeon: Spero Geralds, MD;  Location: [facility];  Service: Pulmonary;  Laterality: Right;    SOCIAL HISTORY: Social History   Socioeconomic History  . Marital status: Legally Separated    Spouse name: Not on file  . Number of children: 2  . Years of education: Not on file  . Highest education level: Not on file  Occupational History  . Occupation: retired  Tobacco Use  . Smoking status: Former Smoker    Packs/day:  1.50    Years: 46.00    Pack years: 69.00    Types: Cigarettes    Start date: 05/17/1965    Quit date: 12/08/2012    Years since quitting: 7.7  . Smokeless tobacco: Never Used  Vaping Use  . Vaping Use: Never used  Substance and Sexual Activity  . Alcohol use: No    Alcohol/week: 0.0 standard drinks  . Drug use: No  . Sexual activity: Not Currently  Other Topics Concern  . Not on file  Social History Narrative  . Not on file   Social Determinants of Health   Financial Resource Strain: Low Risk   . Difficulty of Paying Living Expenses: Not hard at all  Food Insecurity: No Food Insecurity  . Worried About Charity fundraiser in the Last Year: Never true  . Ran Out of Food in the Last Year: Never true  Transportation Needs: No Transportation Needs  . Lack of Transportation (Medical): No  . Lack of Transportation (Non-Medical): No  Physical Activity: Insufficiently Active  . Days of Exercise per Week: 1 day  . Minutes of Exercise per Session: 20 min  Stress: No Stress Concern Present  . Feeling of Stress : Not at all  Social Connections: Moderately Isolated  . Frequency of Communication with Friends and Family: Three times a week  . Frequency of Social Gatherings with Friends and Family: Twice a week  . Attends Religious Services: More than 4 times per year  . Active Member of Clubs or Organizations: No  . Attends Archivist Meetings: Never  . Marital Status: Separated  Intimate Partner Violence: Not At Risk  . Fear of Current or Ex-Partner: No  . Emotionally Abused: No  . Physically Abused: No  . Sexually Abused: No    FAMILY HISTORY: Family History  Problem Relation Age of Onset  . Pneumonia Mother   . Other Father        brain tumor  . Diabetes Son   . Lung cancer Neg Hx     ALLERGIES:  is allergic to hydrochlorothiazide and pravastatin.  MEDICATIONS:  Current Outpatient Medications  Medication Sig Dispense Refill  . albuterol (PROVENTIL) (2.5  MG/3ML) 0.083% nebulizer solution Take 2.5 mg by nebulization every 6 (six) hours as needed for wheezing or shortness of breath.     Marland Kitchen atorvastatin (LIPITOR) 80 MG tablet Take 80 mg by mouth every other day. AT NIGHT    . carvedilol (COREG) 3.125 MG tablet Take 1 tablet (3.125 mg total) by mouth 2 (two) times daily. 60 tablet 6  . Cholecalciferol (VITAMIN D3) 5000 units CAPS Take 10,000 Units by mouth daily after breakfast.     . lisinopril (PRINIVIL,ZESTRIL) 40 MG tablet Take 1 tablet (40 mg total) by mouth daily. 30 tablet 6   No current facility-administered medications for this visit.    REVIEW OF SYSTEMS:   Review  of Systems  Constitutional: Positive for fatigue (75%). Negative for appetite change and unexpected weight change.  Respiratory: Positive for shortness of breath (hx of COPD).   Neurological: Negative for numbness.     PHYSICAL EXAMINATION: ECOG PERFORMANCE STATUS: 1 - Symptomatic but completely ambulatory  Vitals:   09/02/20 0805  BP: (!) 141/82  Pulse: 69  Resp: 19  Temp: (!) 97.3 F (36.3 C)  SpO2: 98%   Filed Weights   09/02/20 0805  Weight: 195 lb 3.2 oz (88.5 kg)   Physical Exam Vitals reviewed.  Constitutional:      Appearance: Normal appearance.  Cardiovascular:     Rate and Rhythm: Normal rate and regular rhythm.     Pulses: Normal pulses.     Heart sounds: Normal heart sounds.  Pulmonary:     Effort: Pulmonary effort is normal.     Breath sounds: Normal breath sounds.  Chest:  Breasts:     Right: No axillary adenopathy or supraclavicular adenopathy.     Left: No axillary adenopathy or supraclavicular adenopathy.    Abdominal:     Palpations: Abdomen is soft. There is no hepatomegaly, splenomegaly or mass.     Tenderness: There is no abdominal tenderness.     Hernia: No hernia is present.  Musculoskeletal:     Right lower leg: No edema.     Left lower leg: No edema.  Lymphadenopathy:     Cervical: No cervical adenopathy.     Upper  Body:     Right upper body: No supraclavicular, axillary or pectoral adenopathy.     Left upper body: No supraclavicular, axillary or pectoral adenopathy.     Lower Body: No right inguinal adenopathy. No left inguinal adenopathy.  Neurological:     General: No focal deficit present.     Mental Status: He is alert and oriented to person, place, and time.  Psychiatric:        Mood and Affect: Mood normal.        Behavior: Behavior normal.      LABORATORY DATA:  I have reviewed the data as listed CBC Latest Ref Rng & Units 07/24/2020 07/23/2020 07/22/2020  WBC 4.0 - 10.5 K/uL 9.7 9.5 -  Hemoglobin 13.0 - 17.0 g/dL 14.0 14.1 14.6  Hematocrit 39.0 - 52.0 % 40.9 41.6 43.0  Platelets 150 - 400 K/uL 180 161 -   CMP Latest Ref Rng & Units 07/25/2020 07/24/2020 07/23/2020  Glucose 70 - 99 mg/dL 110(H) 129(H) 152(H)  BUN 8 - 23 mg/dL _0 Creatinine 0.61 - 1.24 mg/dL 1.44(H) 1.59(H) 1.47(H)  Sodium 135 - 145 mmol/L 134(L) 134(L) 133(L)  Potassium 3.5 - 5.1 mmol/L 4.4 5.0 4.3  Chloride 98 - 111 mmol/L 98 98 102  CO2 22 - 32 mmol/L _1 Calcium 8.9 - 10.3 mg/dL 8.6(L) 8.7(L) 8.5(L)  Total Protein 6.5 - 8.1 g/dL - 6.1(L) -  Total Bilirubin 0.3 - 1.2 mg/dL - 0.8 -  Alkaline Phos 38 - 126 U/L - 61 -  AST 15 - 41 U/L - 19 -  ALT 0 - 44 U/L - 14 -   Surgical pathology ([id number]) on 07/22/2020: RUL lobectomy: invasive moderately differentiated squamous cell carcinoma involving RUL bronchus.  RADIOGRAPHIC STUDIES: I have personally reviewed the radiological images as listed and agreed with the findings in the report. DG Chest 2 View  Result Date: 08/13/2020 CLINICAL DATA:  History of right lung cancer. EXAM: CHEST - 2 VIEW COMPARISON:  July 27, 2020. FINDINGS: The heart size and mediastinal contours are within normal limits. No pneumothorax is noted. Left lung is clear. Small right pleural effusion is noted with associated right basilar atelectasis or scarring. The  visualized skeletal structures are unremarkable. IMPRESSION: Small right pleural effusion with associated right basilar atelectasis or scarring. Electronically Signed   By: Marijo Conception M.D.   On: 08/13/2020 10:36    ASSESSMENT:  1.  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  Anteromedial right upper lobe distal airway enlargement appears FDG avid with SUV of 13.6 concerning for endobronchial spread of tumor.  No signs of FDG avid mediastinal or hilar adenopathy or distant metastatic disease.  Multiple tiny peripheral nodules identified within the right upper lobe.  Bilateral adrenal gland adenomas. -Bronchoscopy and biopsy on 07/02/2020 showed poorly differentiated squamous cell carcinoma. -Right upper lobectomy and lymph node dissection by Dr. Roxan Hockey on 07/22/2020. -Pathology showed 5.1 cm moderately differentiated squamous cell carcinoma involving right upper lobe bronchus, visceral pleura not involved.  Bronchovascular resection margins are negative.  Negative lymphatic or vascular invasion.  Lymph nodes at level 7, levels 9, 11, 12 are negative for carcinoma.  pT3 PN0.0/16 lymph nodes involved.  2.  Social/family history: -He lives at home by himself and is independent of all ADLs and IADLs. -He is accompanied by his daughter who works as a Marine scientist in cardiovascular surgery department. -He worked in [place] and quit smoking in 11-04-12. -Brother died of metastatic cancer.  Maternal cousin had "bone cancer".    PLAN:  1.  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 -We also discussed prognosis for his stage and benefits from adjuvant chemotherapy. -He would like to wait at least a month before proceeding with recommended  adjuvant chemotherapy.  He wants to see improvement in his fatigue and dyspnea on exertion prior to starting chemotherapy. -Recommend PD-L1 and foundation 1 testing which will help [place] if he is a candidate for Atezolizumab or osimertinib upon completion of chemotherapy. -We discussed adjuvant chemotherapy with carboplatin and paclitaxel every 3 weeks for 4 cycles.  He is not a candidate for cisplatin because of CKD. -We will make a referral for port placement. -I have also recommended MRI of the brain with and without gadolinium to complete staging work-up. -RTC 3 to 4 weeks to discuss results as well as start chemotherapy.    All questions were answered. The patient knows to call the clinic with any problems, questions or concerns.   Derek Jack, MD, 09/02/20 9:04 AM  [place] [phone]   I, Milinda Antis, am acting as a scribe for Dr. Sanda Linger.  I, Derek Jack MD, have reviewed the above documentation for accuracy and completeness, and I agree with the above.

## 2020-09-02 NOTE — Progress Notes (Signed)
Foundation One and PDL1 order per Dr. Delton Coombes

## 2020-09-02 NOTE — Patient Instructions (Signed)
[place] at [facility] Discharge Instructions  You were seen and examined today by Dr. Delton Coombes. Dr. Delton Coombes is a medical oncologist, meaning he specializes in the management of cancer diagnoses with medicines. Dr. Delton Coombes discussed your past medical history, family history of cancer and the events that led to you being here today.  You have been diagnosed with Stage IIb Squamous Cell (Non-Small Cell) Lung Cancer. Dr. Delton Coombes has recommended 4 rounds of chemotherapy. This is because of the size of your tumor, chemotherapy will prevent recurrence. Dr. Delton Coombes has also recommended additional testing on the tissue that was collected during your surgery to see if there are further treatment options to prevent recurrence after you finish your course of chemotherapy. Dr. Delton Coombes has also recommended a brain MRI to complete the staging process.  The recommended chemotherapy recommended is Carboplatin/Taxol once every 3 weeks for 4 cycles. This usually begins 4 to 6 weeks after surgery for the best outcomes. Common side effects of chemotherapy includes fatigue, decreased blood counts and nausea. For chemotherapy, you will need to have a Port-A-Cath placed, this is the safest way to administer chemotherapy.   Thank you for choosing [place] at [facility] to provide your oncology and hematology care.  To afford each patient quality time with our provider, please arrive at least 15 minutes before your scheduled appointment time.   If you have a lab appointment with the [facility] please come in thru the Main Entrance and check in at the main information desk.  You need to re-schedule your appointment should you arrive 10 or more minutes late.  We strive to give you quality time with our providers, and arriving late affects you and other patients whose appointments are after yours.  Also, if you no show three or more times for appointments you  may be dismissed from the clinic at the providers discretion.     Again, thank you for choosing [facility].  Our hope is that these requests will decrease the amount of time that you wait before being seen by our physicians.       _____________________________________________________________  Should you have questions after your visit to [facility], please contact our office at [phone] and follow the prompts.  Our office hours are 8:00 a.m. and 4:30 p.m. Monday - Friday.  Please note that voicemails left after 4:00 p.m. may not be returned until the following business day.  We are closed weekends and major holidays.  You do have access to a nurse 24-7, just call the main number to the clinic [phone] and do not press any options, hold on the line and a nurse will answer the phone.    For prescription refill requests, have your pharmacy contact our office and allow 72 hours.    Due to Covid, you will need to wear a mask upon entering the hospital. If you do not have a mask, a mask will be given to you at the Main Entrance upon arrival. For doctor visits, patients may have 1 support person age 54 or older with them. For treatment visits, patients can not have anyone with them due to social distancing guidelines and our immunocompromised population.

## 2020-09-02 NOTE — Progress Notes (Signed)
START ON PATHWAY REGIMEN - Non-Small Cell Lung     A cycle is every 21 days:     Paclitaxel      Carboplatin   **Always confirm dose/schedule in your pharmacy ordering system**  Patient Characteristics: Postoperative without Neoadjuvant Therapy (Pathologic Staging), Stage IIB, Adjuvant Chemotherapy, Squamous Cell Therapeutic Status: Postoperative without Neoadjuvant Therapy (Pathologic Staging) AJCC T Category: pT3 AJCC N Category: pN0 AJCC M Category: cM0 AJCC 8 Stage Grouping: IIB Histology: Squamous Cell Intent of Therapy: Curative Intent, Discussed with Patient

## 2020-09-02 NOTE — Progress Notes (Signed)
I met with the patient and daughter today during initial visit with Dr. Delton Coombes. I introduced myself and explained my role in the patient's care. I provided my contact information and encouraged the patient to call with any questions or concerns.

## 2020-09-12 ENCOUNTER — Other Ambulatory Visit: Payer: Self-pay

## 2020-09-12 ENCOUNTER — Ambulatory Visit (HOSPITAL_COMMUNITY)
Admission: RE | Admit: 2020-09-12 | Discharge: 2020-09-12 | Disposition: A | Discharge status: Home / Self Care | Payer: PPO | Source: Ambulatory Visit | Attending: Hematology | Admitting: Hematology

## 2020-09-12 DIAGNOSIS — C3490 Malignant neoplasm of unspecified part of unspecified bronchus or lung: Secondary | ICD-10-CM | POA: Diagnosis not present

## 2020-09-12 DIAGNOSIS — C3491 Malignant neoplasm of unspecified part of right bronchus or lung: Secondary | ICD-10-CM | POA: Insufficient documentation

## 2020-09-12 DIAGNOSIS — J3489 Other specified disorders of nose and nasal sinuses: Secondary | ICD-10-CM | POA: Diagnosis not present

## 2020-09-12 DIAGNOSIS — R531 Weakness: Secondary | ICD-10-CM | POA: Diagnosis not present

## 2020-09-12 MED ORDER — GADOBUTROL 1 MMOL/ML IV SOLN
10.0000 mL | Freq: Once | INTRAVENOUS | Status: AC | PRN
  Administered 2020-09-12: 10 mL via INTRAVENOUS

## 2020-09-13 DIAGNOSIS — C3490 Malignant neoplasm of unspecified part of unspecified bronchus or lung: Secondary | ICD-10-CM | POA: Diagnosis not present

## 2020-09-20 ENCOUNTER — Encounter (HOSPITAL_COMMUNITY): Payer: Self-pay | Admitting: Hematology

## 2020-09-25 ENCOUNTER — Encounter (HOSPITAL_COMMUNITY): Payer: Self-pay

## 2020-09-26 DIAGNOSIS — C3490 Malignant neoplasm of unspecified part of unspecified bronchus or lung: Secondary | ICD-10-CM | POA: Diagnosis not present

## 2020-09-30 ENCOUNTER — Encounter (HOSPITAL_COMMUNITY): Payer: Self-pay

## 2020-10-01 ENCOUNTER — Inpatient Hospital Stay (HOSPITAL_COMMUNITY): Payer: PPO

## 2020-10-05 DIAGNOSIS — N183 Chronic kidney disease, stage 3 unspecified: Secondary | ICD-10-CM | POA: Diagnosis not present

## 2020-10-05 DIAGNOSIS — I129 Hypertensive chronic kidney disease with stage 1 through stage 4 chronic kidney disease, or unspecified chronic kidney disease: Secondary | ICD-10-CM | POA: Diagnosis not present

## 2020-10-05 DIAGNOSIS — E7849 Other hyperlipidemia: Secondary | ICD-10-CM | POA: Diagnosis not present

## 2020-10-08 ENCOUNTER — Other Ambulatory Visit (HOSPITAL_COMMUNITY): Payer: PPO

## 2020-10-08 ENCOUNTER — Ambulatory Visit (HOSPITAL_COMMUNITY): Payer: PPO

## 2020-10-08 ENCOUNTER — Ambulatory Visit (HOSPITAL_COMMUNITY): Payer: PPO | Admitting: Hematology

## 2020-10-14 ENCOUNTER — Other Ambulatory Visit: Payer: Self-pay | Admitting: Thoracic Surgery (Cardiothoracic Vascular Surgery)

## 2020-10-14 DIAGNOSIS — C3411 Malignant neoplasm of upper lobe, right bronchus or lung: Secondary | ICD-10-CM

## 2020-10-15 ENCOUNTER — Other Ambulatory Visit: Payer: Self-pay

## 2020-10-15 ENCOUNTER — Ambulatory Visit
Admission: RE | Admit: 2020-10-15 | Discharge: 2020-10-15 | Disposition: A | Discharge status: Home / Self Care | Payer: PPO | Source: Ambulatory Visit | Attending: Thoracic Surgery (Cardiothoracic Vascular Surgery) | Admitting: Thoracic Surgery (Cardiothoracic Vascular Surgery)

## 2020-10-15 ENCOUNTER — Ambulatory Visit (INDEPENDENT_AMBULATORY_CARE_PROVIDER_SITE_OTHER): Payer: Self-pay | Admitting: Thoracic Surgery (Cardiothoracic Vascular Surgery)

## 2020-10-15 ENCOUNTER — Encounter: Payer: Self-pay | Admitting: Thoracic Surgery (Cardiothoracic Vascular Surgery)

## 2020-10-15 VITALS — BP 150/77 | HR 65 | Temp 97.7°F | Resp 20 | Ht 69.0 in | Wt 198.0 lb

## 2020-10-15 DIAGNOSIS — Z09 Encounter for follow-up examination after completed treatment for conditions other than malignant neoplasm: Secondary | ICD-10-CM

## 2020-10-15 DIAGNOSIS — C3411 Malignant neoplasm of upper lobe, right bronchus or lung: Secondary | ICD-10-CM

## 2020-10-15 DIAGNOSIS — J449 Chronic obstructive pulmonary disease, unspecified: Secondary | ICD-10-CM | POA: Diagnosis not present

## 2020-10-15 NOTE — Progress Notes (Signed)
[address]       [place] [address]             [phone]     HPI: Nicolas Butler returns for a scheduled postoperative follow-up visit  Nicolas Butler is a 70 year old man with a history of tobacco abuse, hypertension, hyperlipidemia, heart failure, stage III chronic kidney disease, and abdominal aortic aneurysm.  He quit smoking back in 2014.  He was found to have a right upper lobe lung mass on a low-dose screening CT.  On PET that was hypermetabolic.  I did a robotic right upper lobectomy and node dissection on 07/22/2020.  Pathology showed a T3, N0, stage IIb squamous cell carcinoma.  He did well postop and went home on day 5.  Saw him in the office on 08/13/2020.  He was doing well at that time.  In the interim since that visit he saw Dr. Delton Coombes.  Adjuvant chemotherapy was recommended, but Nicolas Butler has elected not to pursue that.  He says he does not really have a good reason for that, but just does not want to.  He feels well.  He is not having any incisional pain.  His exercise tolerance is improved.  Past Medical History:  Diagnosis Date  . AAA (abdominal aortic aneurysm) ([place])   . CHF (congestive heart failure) ([place])   . Chronic kidney disease (CKD), stage III (moderate) (HCC)   . Complication of anesthesia    Bowel and bladder are slow to wake up after surgery  . COPD (chronic obstructive pulmonary disease) ([place])   . Coronary artery disease    presumed  . Dyspnea    exertion  . High cholesterol   . Lung cancer ([place])   . Unspecified essential hypertension     Current Outpatient Medications  Medication Sig Dispense Refill  . albuterol (PROVENTIL) (2.5 MG/3ML) 0.083% nebulizer solution Take 2.5 mg by nebulization every 6 (six) hours as needed for wheezing or shortness of breath.     Marland Kitchen atorvastatin (LIPITOR) 80 MG tablet Take 80 mg by mouth every other day. AT NIGHT    . carvedilol (COREG) 3.125 MG tablet Take 1 tablet (3.125 mg total) by  mouth 2 (two) times daily. 60 tablet 6  . Cholecalciferol (VITAMIN D3) 5000 units CAPS Take 10,000 Units by mouth daily after breakfast.     . lisinopril (PRINIVIL,ZESTRIL) 40 MG tablet Take 1 tablet (40 mg total) by mouth daily. 30 tablet 6   No current facility-administered medications for this visit.    Physical Exam BP (!) 150/77   Pulse 65   Temp 97.7 F (36.5 C) (Skin)   Resp 20   Ht 5\' 9"  (1.753 m)   Wt 198 lb (89.8 kg)   SpO2 95% Comment: RA  BMI 29.24 kg/m  Well-appearing 70 year old man in no acute distress Alert and oriented x3 with no focal deficits Lungs slightly diminished at right base but otherwise clear Cardiac regular rate and rhythm No peripheral edema  Diagnostic Tests: CHEST - 2 VIEW  COMPARISON:  08/13/2020  FINDINGS: Right-sided volume loss is again seen in keeping with partial right lung resection. Previously noted pleural effusion has resolved. The lungs appear mildly hyperinflated in keeping with changes of underlying COPD. No superimposed confluent pulmonary infiltrate. No pneumothorax. Cardiac size within normal limits. Pulmonary vascularity is normal. No acute bone abnormality.  IMPRESSION: Status post partial right lung resection. Resolved pleural effusion. No radiographic evidence of acute cardiopulmonary disease. COPD.  Electronically Signed   By: Fidela Salisbury MD   On: 10/15/2020 10:47  I personally reviewed the chest x-ray images.  Postoperative changes.  No active issues.  Impression: Nicolas Butler is a 70 year old gentleman who had a robotic right upper lobectomy for a T3, N0, stage IIb squamous cell carcinoma was found on a low-dose CT for lung cancer screening.  He is now about 3 months out from surgery.  He is doing well.  He does not have any physical limitations secondary to the procedure.  He is not having any pain.  He did see Dr. Delton Coombes, but elected not to have chemotherapy.  He does not have any follow-up  scheduled.  I emphasized the importance of continued follow-up.  He will contact Dr. Tomie China office.  There are no restrictions on his activities.  He quit smoking 8 years ago.  Plan: Follow-up with Dr. Delton Coombes.  Will need monitoring even though he declined chemotherapy.  Return in 6 months with PA lateral chest x-ray to check on progress  Melrose Nakayama, MD [facility] [phone]

## 2020-10-21 DIAGNOSIS — Z6829 Body mass index (BMI) 29.0-29.9, adult: Secondary | ICD-10-CM | POA: Diagnosis not present

## 2020-10-21 DIAGNOSIS — J449 Chronic obstructive pulmonary disease, unspecified: Secondary | ICD-10-CM | POA: Diagnosis not present

## 2020-10-21 DIAGNOSIS — E7849 Other hyperlipidemia: Secondary | ICD-10-CM | POA: Diagnosis not present

## 2020-10-21 DIAGNOSIS — N183 Chronic kidney disease, stage 3 unspecified: Secondary | ICD-10-CM | POA: Diagnosis not present

## 2020-10-21 DIAGNOSIS — I10 Essential (primary) hypertension: Secondary | ICD-10-CM | POA: Diagnosis not present

## 2020-10-21 DIAGNOSIS — E782 Mixed hyperlipidemia: Secondary | ICD-10-CM | POA: Diagnosis not present

## 2020-10-21 DIAGNOSIS — Z902 Acquired absence of lung [part of]: Secondary | ICD-10-CM | POA: Diagnosis not present

## 2020-10-28 ENCOUNTER — Other Ambulatory Visit (HOSPITAL_COMMUNITY): Payer: Self-pay

## 2020-10-28 DIAGNOSIS — C3491 Malignant neoplasm of unspecified part of right bronchus or lung: Secondary | ICD-10-CM

## 2020-10-28 DIAGNOSIS — C3411 Malignant neoplasm of upper lobe, right bronchus or lung: Secondary | ICD-10-CM

## 2020-11-04 DIAGNOSIS — N183 Chronic kidney disease, stage 3 unspecified: Secondary | ICD-10-CM | POA: Diagnosis not present

## 2020-11-04 DIAGNOSIS — E7849 Other hyperlipidemia: Secondary | ICD-10-CM | POA: Diagnosis not present

## 2020-11-04 DIAGNOSIS — I129 Hypertensive chronic kidney disease with stage 1 through stage 4 chronic kidney disease, or unspecified chronic kidney disease: Secondary | ICD-10-CM | POA: Diagnosis not present

## 2020-12-18 ENCOUNTER — Encounter (HOSPITAL_COMMUNITY): Payer: Self-pay | Admitting: Radiology

## 2020-12-18 ENCOUNTER — Ambulatory Visit (HOSPITAL_COMMUNITY)
Admission: RE | Admit: 2020-12-18 | Discharge: 2020-12-18 | Disposition: A | Discharge status: Home / Self Care | Payer: PPO | Source: Ambulatory Visit | Attending: Hematology | Admitting: Hematology

## 2020-12-18 ENCOUNTER — Inpatient Hospital Stay (HOSPITAL_COMMUNITY): Payer: PPO | Attending: Hematology

## 2020-12-18 ENCOUNTER — Other Ambulatory Visit: Payer: Self-pay

## 2020-12-18 DIAGNOSIS — K8080 Other cholelithiasis without obstruction: Secondary | ICD-10-CM | POA: Diagnosis not present

## 2020-12-18 DIAGNOSIS — C3491 Malignant neoplasm of unspecified part of right bronchus or lung: Secondary | ICD-10-CM

## 2020-12-18 DIAGNOSIS — Z85118 Personal history of other malignant neoplasm of bronchus and lung: Secondary | ICD-10-CM | POA: Diagnosis not present

## 2020-12-18 DIAGNOSIS — C3411 Malignant neoplasm of upper lobe, right bronchus or lung: Secondary | ICD-10-CM | POA: Insufficient documentation

## 2020-12-18 DIAGNOSIS — I2510 Atherosclerotic heart disease of native coronary artery without angina pectoris: Secondary | ICD-10-CM | POA: Diagnosis not present

## 2020-12-18 DIAGNOSIS — J439 Emphysema, unspecified: Secondary | ICD-10-CM | POA: Diagnosis not present

## 2020-12-18 DIAGNOSIS — Z87891 Personal history of nicotine dependence: Secondary | ICD-10-CM | POA: Insufficient documentation

## 2020-12-18 LAB — CBC WITH DIFFERENTIAL/PLATELET
Abs Immature Granulocytes: 0.02 10*3/uL (ref 0.00–0.07)
Basophils Absolute: 0.1 10*3/uL (ref 0.0–0.1)
Basophils Relative: 1 %
Eosinophils Absolute: 0.2 10*3/uL (ref 0.0–0.5)
Eosinophils Relative: 4 %
HCT: 47.4 % (ref 39.0–52.0)
Hemoglobin: 16.5 g/dL (ref 13.0–17.0)
Immature Granulocytes: 0 %
Lymphocytes Relative: 26 %
Lymphs Abs: 1.7 10*3/uL (ref 0.7–4.0)
MCH: 33.6 pg (ref 26.0–34.0)
MCHC: 34.8 g/dL (ref 30.0–36.0)
MCV: 96.5 fL (ref 80.0–100.0)
Monocytes Absolute: 0.6 10*3/uL (ref 0.1–1.0)
Monocytes Relative: 9 %
Neutro Abs: 3.9 10*3/uL (ref 1.7–7.7)
Neutrophils Relative %: 60 %
Platelets: 177 10*3/uL (ref 150–400)
RBC: 4.91 MIL/uL (ref 4.22–5.81)
RDW: 12.7 % (ref 11.5–15.5)
WBC: 6.5 10*3/uL (ref 4.0–10.5)
nRBC: 0 % (ref 0.0–0.2)

## 2020-12-18 LAB — COMPREHENSIVE METABOLIC PANEL
ALT: 17 U/L (ref 0–44)
AST: 17 U/L (ref 15–41)
Albumin: 4 g/dL (ref 3.5–5.0)
Alkaline Phosphatase: 123 U/L (ref 38–126)
Anion gap: 9 (ref 5–15)
BUN: 19 mg/dL (ref 8–23)
CO2: 23 mmol/L (ref 22–32)
Calcium: 8.8 mg/dL — ABNORMAL LOW (ref 8.9–10.3)
Chloride: 101 mmol/L (ref 98–111)
Creatinine, Ser: 1.41 mg/dL — ABNORMAL HIGH (ref 0.61–1.24)
GFR, Estimated: 54 mL/min — ABNORMAL LOW (ref >60)
Glucose, Bld: 93 mg/dL (ref 70–99)
Potassium: 4.3 mmol/L (ref 3.5–5.1)
Sodium: 133 mmol/L — ABNORMAL LOW (ref 135–145)
Total Bilirubin: 1.5 mg/dL — ABNORMAL HIGH (ref 0.3–1.2)
Total Protein: 7.5 g/dL (ref 6.5–8.1)

## 2020-12-18 LAB — POCT I-STAT CREATININE: Creatinine, Ser: 1.6 mg/dL — ABNORMAL HIGH (ref 0.61–1.24)

## 2020-12-18 MED ORDER — IOHEXOL 300 MG/ML  SOLN
75.0000 mL | Freq: Once | INTRAMUSCULAR | Status: AC | PRN
  Administered 2020-12-18: 75 mL via INTRAVENOUS

## 2020-12-23 ENCOUNTER — Other Ambulatory Visit: Payer: Self-pay

## 2020-12-23 ENCOUNTER — Inpatient Hospital Stay (HOSPITAL_COMMUNITY): Payer: PPO | Admitting: Hematology

## 2020-12-23 VITALS — BP 144/79 | HR 62 | Temp 96.9°F | Resp 20 | Wt 201.6 lb

## 2020-12-23 DIAGNOSIS — C3411 Malignant neoplasm of upper lobe, right bronchus or lung: Secondary | ICD-10-CM | POA: Diagnosis not present

## 2020-12-23 DIAGNOSIS — C3491 Malignant neoplasm of unspecified part of right bronchus or lung: Secondary | ICD-10-CM

## 2020-12-23 NOTE — Patient Instructions (Signed)
[place] at [facility] Discharge Instructions  You were seen today by Dr. Delton Coombes. He went over your recent results and scans. You will be scheduled to have a CT scan of your chest done before your next visit. Dr. Delton Coombes will see you back in 4 months for labs and follow up.   Thank you for choosing [place] at [facility] to provide your oncology and hematology care.  To afford each patient quality time with our provider, please arrive at least 15 minutes before your scheduled appointment time.   If you have a lab appointment with the [place] [facility] please come in thru the Main Entrance and check in at the main information desk  You need to re-schedule your appointment should you arrive 10 or more minutes late.  We strive to give you quality time with our providers, and arriving late affects you and other patients whose appointments are after yours.  Also, if you no show three or more times for appointments you may be dismissed from the clinic at the providers discretion.     Again, thank you for choosing [facility].  Our hope is that these requests will decrease the amount of time that you wait before being seen by our physicians.       _____________________________________________________________  Should you have questions after your visit to [facility], please contact our office at [phone] between the hours of 8:00 a.m. and 4:30 p.m.  Voicemails left after 4:00 p.m. will not be returned until the following business day.  For prescription refill requests, have your pharmacy contact our office and allow 72 hours.    Cancer Center Support Programs:   > Cancer Support Group  2nd Tuesday of the month 1pm-2pm, Journey Room

## 2020-12-23 NOTE — Progress Notes (Signed)
Nicolas Butler, [place] [id number]   CLINIC:  Medical Oncology/Hematology  PCP:  Lavella Lemons, PA [address] 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  PRIOR THERAPY: Right upper lobectomy and lymph node dissection on 07/22/2020  NGS Results: Founation 1 MS-stable, TMB 19 Muts/Mb, PD-L1 TPS 0%  CURRENT THERAPY: Surveillance  BRIEF ONCOLOGIC HISTORY:  Oncology History  Malignant neoplasm of upper lobe of right lung ([facility])  07/11/2020 Initial Diagnosis   Malignant neoplasm of upper lobe of right lung ([facility])   07/25/2020 Cancer Staging   Staging form: Lung, AJCC 8th Edition - Pathologic stage from 07/25/2020: Stage IIB (pT3, pN0, cM0) - Signed by Melrose Nakayama, MD on 07/25/2020   09/20/2020 Genetic Testing   PD-L1 Results:     09/23/2020 -  Chemotherapy   The patient had PALONOSETRON HCL INJECTION 0.25 MG/5ML, 0.25 mg, Intravenous,  Once, 0 of 4 cycles pegfilgrastim-jmdb (FULPHILA) injection 6 mg, 6 mg, Subcutaneous,  Once, 0 of 4 cycles CARBOplatin (PARAPLATIN) in sodium chloride 0.9 % 100 mL chemo infusion, , Intravenous,  Once, 0 of 4 cycles FOSAPREPITANT IV INFUSION 150 MG, 150 mg, Intravenous,  Once, 0 of 4 cycles PACLitaxel (TAXOL) 414 mg in sodium chloride 0.9 % 500 mL chemo infusion (> 32m/m2), 200 mg/m2, Intravenous,  Once, 0 of 4 cycles  for chemotherapy treatment.    09/26/2020 Genetic Testing   [facility] Results:       CANCER STAGING: Cancer Staging Malignant neoplasm of upper lobe of right lung ([facility] Staging form: Lung, AJCC 8th Edition - Clinical stage from 07/11/2020: Stage IA3 (cT1c, cN0, cM0) - Unsigned - Pathologic stage from 07/25/2020: Stage IIB (pT3, pN0, cM0) - Signed by HMelrose Nakayama MD on 07/25/2020   INTERVAL HISTORY:  Nicolas Butler a 70y.o. male, returns for routine follow-up of his right squamous cell lung  carcinoma. JKairiwas last seen on 09/02/2020.   Today he reports feeling well. His energy levels are staying stable. He reports having SOB with exertion after walking half of a mile since having his lobectomy. He denies having leg swelling.   REVIEW OF SYSTEMS:  Review of Systems  Constitutional: Negative for appetite change and fatigue.  Respiratory: Positive for shortness of breath (w/ exertion).   Cardiovascular: Negative for leg swelling.  All other systems reviewed and are negative.   PAST MEDICAL/SURGICAL HISTORY:  Past Medical History:  Diagnosis Date  . AAA (abdominal aortic aneurysm) ([facility]   . CHF (congestive heart failure) ([facility]   . Chronic kidney disease (CKD), stage III (moderate) ([facility])   . Complication of anesthesia    Bowel and bladder are slow to wake up after surgery  . COPD (chronic obstructive pulmonary disease) ([facility]   . Coronary artery disease    presumed  . Dyspnea    exertion  . High cholesterol   . Lung cancer ([facility]   . Unspecified essential hypertension    Past Surgical History:  Procedure Laterality Date  . ABDOMINAL AORTIC ANEURYSM REPAIR N/A 06/28/2017   Procedure: ANEURYSM ABDOMINAL AORTIC REPAIR OPEN;  Surgeon: FElam Dutch MD;  [facility]  Service: Vascular;  Laterality: N/A;  . BRONCHIAL BRUSHINGS  07/02/2020   Procedure: BRONCHIAL BRUSHINGS;  Surgeon: DSpero Geralds MD;  Location: [facility];  Service: Pulmonary;;  . BRONCHIAL WASHINGS  07/02/2020   Procedure: BRONCHIAL WASHINGS;  Surgeon:  Spero Geralds, MD;  [facility];  Service: Pulmonary;;  BAL and washings  . HAND SURGERY Left   . HERNIA REPAIR    . INCISIONAL HERNIA REPAIR     LEFT  . INTERCOSTAL NERVE BLOCK Right 07/22/2020   Procedure: INTERCOSTAL NERVE BLOCK;  Surgeon: Melrose Nakayama, MD;  Location: [facility];  Service: Thoracic;  Laterality: Right;  . IRRIGATION AND DEBRIDEMENT SEBACEOUS CYST     FROM RIGHT SHOULDER  . LUNG BIOPSY  07/02/2020    Procedure: LUNG BIOPSY;  Surgeon: Spero Geralds, MD;  Location: [facility];  Service: Pulmonary;;  . NODE DISSECTION Right 07/22/2020   Procedure: NODE DISSECTION;  Surgeon: Melrose Nakayama, MD;  Location: [facility];  Service: Thoracic;  Laterality: Right;  . UMBILICAL HERNIA REPAIR    . VIDEO BRONCHOSCOPY Right 07/02/2020   Procedure: VIDEO BRONCHOSCOPY WITHOUT FLUORO;  Surgeon: Spero Geralds, MD;  Location: [facility];  Service: Pulmonary;  Laterality: Right;    SOCIAL HISTORY:  Social History   Socioeconomic History  . Marital status: Legally Separated    Spouse name: Not on file  . Number of children: 2  . Years of education: Not on file  . Highest education level: Not on file  Occupational History  . Occupation: retired  Tobacco Use  . Smoking status: Former Smoker    Packs/day: 1.50    Years: 46.00    Pack years: 69.00    Types: Cigarettes    Start date: 05/17/1965    Quit date: 12/08/2012    Years since quitting: 8.0  . Smokeless tobacco: Never Used  Vaping Use  . Vaping Use: Never used  Substance and Sexual Activity  . Alcohol use: No    Alcohol/week: 0.0 standard drinks  . Drug use: No  . Sexual activity: Not Currently  Other Topics Concern  . Not on file  Social History Narrative  . Not on file   Social Determinants of Health   Financial Resource Strain: Low Risk   . Difficulty of Paying Living Expenses: Not hard at all  Food Insecurity: No Food Insecurity  . Worried About Charity fundraiser in the Last Year: Never true  . Ran Out of Food in the Last Year: Never true  Transportation Needs: No Transportation Needs  . Lack of Transportation (Medical): No  . Lack of Transportation (Non-Medical): No  Physical Activity: Insufficiently Active  . Days of Exercise per Week: 1 day  . Minutes of Exercise per Session: 20 min  Stress: No Stress Concern Present  . Feeling of Stress : Not at all  Social Connections: Moderately Isolated  . Frequency of  Communication with Friends and Family: Three times a week  . Frequency of Social Gatherings with Friends and Family: Twice a week  . Attends Religious Services: More than 4 times per year  . Active Member of Clubs or Organizations: No  . Attends Archivist Meetings: Never  . Marital Status: Separated  Intimate Partner Violence: Not At Risk  . Fear of Current or Ex-Partner: No  . Emotionally Abused: No  . Physically Abused: No  . Sexually Abused: No    FAMILY HISTORY:  Family History  Problem Relation Age of Onset  . Pneumonia Mother   . Other Father        brain tumor  . Diabetes Son   . Lung cancer Neg Hx     CURRENT MEDICATIONS:  Current Outpatient Medications  Medication Sig Dispense  Refill  . albuterol (PROVENTIL) (2.5 MG/3ML) 0.083% nebulizer solution Take 2.5 mg by nebulization every 6 (six) hours as needed for wheezing or shortness of breath.     Marland Kitchen atorvastatin (LIPITOR) 80 MG tablet Take 80 mg by mouth every other day. AT NIGHT    . carvedilol (COREG) 3.125 MG tablet Take 1 tablet (3.125 mg total) by mouth 2 (two) times daily. 60 tablet 6  . Cholecalciferol (VITAMIN D3) 5000 units CAPS Take 10,000 Units by mouth daily after breakfast.     . lisinopril (PRINIVIL,ZESTRIL) 40 MG tablet Take 1 tablet (40 mg total) by mouth daily. 30 tablet 6   No current facility-administered medications for this visit.    ALLERGIES:  Allergies  Allergen Reactions  . Hydrochlorothiazide Other (See Comments)    Pt states "it made my legs hurt"  . Pravastatin Other (See Comments)    Muscle pain    PHYSICAL EXAM:  Performance status (ECOG): 1 - Symptomatic but completely ambulatory  Vitals:   12/23/20 1113  BP: (!) 144/79  Pulse: 62  Resp: 20  Temp: (!) 96.9 F (36.1 C)  SpO2: 97%   Wt Readings from Last 3 Encounters:  12/23/20 201 lb 9.6 oz (91.4 kg)  10/15/20 198 lb (89.8 kg)  09/02/20 195 lb 3.2 oz (88.5 kg)   Physical Exam Vitals reviewed.  Constitutional:       Appearance: Normal appearance.  Cardiovascular:     Rate and Rhythm: Normal rate and regular rhythm.     Pulses: Normal pulses.     Heart sounds: Normal heart sounds.  Pulmonary:     Effort: Pulmonary effort is normal.     Breath sounds: Normal breath sounds.  Neurological:     General: No focal deficit present.     Mental Status: He is alert and oriented to person, place, and time.  Psychiatric:        Mood and Affect: Mood normal.        Behavior: Behavior normal.      LABORATORY DATA:  I have reviewed the labs as listed.  CBC Latest Ref Rng & Units 12/18/2020 09/02/2020 07/24/2020  WBC 4.0 - 10.5 K/uL 6.5 8.7 9.7  Hemoglobin 13.0 - 17.0 g/dL 16.5 15.9 14.0  Hematocrit 39.0 - 52.0 % 47.4 46.9 40.9  Platelets 150 - 400 K/uL 177 234 180   CMP Latest Ref Rng & Units 12/18/2020 12/18/2020 09/02/2020  Glucose 70 - 99 mg/dL 93 - 90  BUN 8 - 23 mg/dL 19 - 21  Creatinine 0.61 - 1.24 mg/dL 1.41(H) 1.60(H) 1.35(H)  Sodium 135 - 145 mmol/L 133(L) - 136  Potassium 3.5 - 5.1 mmol/L 4.3 - 3.9  Chloride 98 - 111 mmol/L 101 - 101  CO2 22 - 32 mmol/L 23 - 27  Calcium 8.9 - 10.3 mg/dL 8.8(L) - 9.3  Total Protein 6.5 - 8.1 g/dL 7.5 - 7.8  Total Bilirubin 0.3 - 1.2 mg/dL 1.5(H) - 0.7  Alkaline Phos 38 - 126 U/L 123 - 100  AST 15 - 41 U/L 17 - 15  ALT 0 - 44 U/L 17 - 21    DIAGNOSTIC IMAGING:  I have independently reviewed the scans and discussed with the patient. CT Chest W Contrast  Result Date: 12/19/2020 CLINICAL DATA:  History of non-small cell lung cancer status post right upper lobe lobectomy November 2021. EXAM: CT CHEST WITH CONTRAST TECHNIQUE: Multidetector CT imaging of the chest was performed during intravenous contrast administration. CONTRAST:  55m OMNIPAQUE IOHEXOL  300 MG/ML  SOLN COMPARISON:  PET-CT 06/27/2020 FINDINGS: Cardiovascular: The heart is normal in size. No pericardial effusion. Stable tortuosity and calcification of the thoracic aorta. No focal aneurysm or  dissection. The branch vessels are patent. Stable three-vessel coronary artery calcifications. Mediastinum/Nodes: No mediastinal or hilar mass or lymphadenopathy. The esophagus is grossly normal. The thyroid gland is unremarkable. Lungs/Pleura: Surgical changes from a right upper lobe lobectomy. No findings suspicious for residual or recurrent tumor. There are 2 new adjacent pulmonary nodules noted in the right lower lobe. Both nodules measure 6 mm. Indeterminate finding but will require surveillance. Do not see any other definite pulmonary nodules. No acute pulmonary findings. No pleural effusions or pleural nodules. Upper Abdomen: No significant upper abdominal findings. There is a small gallstone noted the gallbladder. Moderate to advanced atherosclerotic calcifications involving the aorta and branch vessels. There are bilateral adrenal gland nodules which are stable and likely benign adenomas. Musculoskeletal: No significant bony findings. IMPRESSION: 1. Surgical changes from a right upper lobe lobectomy. No findings suspicious for residual or recurrent tumor. 2. No mediastinal or hilar mass or adenopathy. 3. Two new adjacent 6 mm pulmonary nodules in the right lower lobe. Indeterminate finding. Recommend follow-up noncontrast chest CT in 3-4 months. 4. Stable bilateral adrenal gland nodules, likely benign adenomas. 5. Stable advanced atherosclerotic calcifications involving the thoracic and abdominal aorta and branch vessels including the coronary arteries. 6. Cholelithiasis. 7. Emphysema and aortic atherosclerosis. Aortic Atherosclerosis (ICD10-I70.0) and Emphysema (ICD10-J43.9). Both Electronically Signed   By: Marijo Sanes M.D.   On: 12/19/2020 15:13     ASSESSMENT:  1.  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  Anteromedial right upper lobe distal  airway enlargement appears FDG avid with SUV of 13.6 concerning for endobronchial spread of tumor.  No signs of FDG avid mediastinal or hilar adenopathy or distant metastatic disease.  Multiple tiny peripheral nodules identified within the right upper lobe.  Bilateral adrenal gland adenomas. -Bronchoscopy and biopsy on 07/02/2020 showed poorly differentiated squamous cell carcinoma. -Right upper lobectomy and lymph node dissection by Dr. Roxan Hockey on 07/22/2020. -Pathology showed 5.1 cm moderately differentiated squamous cell carcinoma involving right upper lobe bronchus, visceral pleura not involved.  Bronchovascular resection margins are negative.  Negative lymphatic or vascular invasion.  Lymph nodes at level 7, levels 9, 11, 12 are negative for carcinoma.  pT3 PN0.0/16 lymph nodes involved. - PD-L1 0%  2.  Social/family history: -He lives at home by himself and is independent of all ADLs and IADLs. -He is accompanied by his daughter who works as a Marine scientist in cardiovascular surgery department. -He worked in [employer] and quit smoking in 10-25-12. -Brother died of metastatic cancer.  Maternal cousin had "bone cancer".   PLAN:  1.  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  Patient declined treatment. - MRI of the brain earlier this year was negative for metastatic disease.  He had a follow-up visit with Dr. Roxan Hockey on on 10/15/2020. - No physical signs or symptoms of recurrence at this time. - CT chest with contrast from 12/18/2020 reviewed by me shows 2 new adjacent 6 mm lung nodules in the right lower lobe.  No mediastinal or hilar adenopathy.  Surgical changes in the right upper lobectomy with no findings of residual or recurrent tumor. - Based on the  new findings, I have recommended follow-up CT scan in 4 months.   Orders placed this encounter:  Orders Placed This Encounter  Procedures  . CT Chest W Contrast  . CBC with  Differential/Platelet  . Comprehensive metabolic panel     Derek Jack, MD [facility] [phone]   I, Milinda Antis, am acting as a scribe for Dr. Sanda Linger.  I, Derek Jack MD, have reviewed the above documentation for accuracy and completeness, and I agree with the above.

## 2021-01-04 DIAGNOSIS — E7849 Other hyperlipidemia: Secondary | ICD-10-CM | POA: Diagnosis not present

## 2021-01-04 DIAGNOSIS — I129 Hypertensive chronic kidney disease with stage 1 through stage 4 chronic kidney disease, or unspecified chronic kidney disease: Secondary | ICD-10-CM | POA: Diagnosis not present

## 2021-01-04 DIAGNOSIS — N183 Chronic kidney disease, stage 3 unspecified: Secondary | ICD-10-CM | POA: Diagnosis not present

## 2021-02-03 DIAGNOSIS — I10 Essential (primary) hypertension: Secondary | ICD-10-CM | POA: Diagnosis not present

## 2021-02-03 DIAGNOSIS — E7849 Other hyperlipidemia: Secondary | ICD-10-CM | POA: Diagnosis not present

## 2021-02-03 DIAGNOSIS — K219 Gastro-esophageal reflux disease without esophagitis: Secondary | ICD-10-CM | POA: Diagnosis not present

## 2021-02-04 ENCOUNTER — Telehealth: Payer: Self-pay | Admitting: Internal Medicine

## 2021-02-04 NOTE — Telephone Encounter (Signed)
I have called and LM on VM for the pt to call back and get an appt scheduled.

## 2021-02-04 NOTE — Telephone Encounter (Signed)
I have tried to call the pt back but now the line is busy.  Will try back later.

## 2021-02-12 NOTE — Telephone Encounter (Signed)
ATC pt. Line rang and then line went silent. ATC back and line was busy. WCB.

## 2021-02-13 NOTE — Telephone Encounter (Signed)
Called and spoke to pt. Pt has an appt with Dr. Shearon Stalls on 02/17/21. Offered pt a sooner appt for 6/10, before the weekend. Pt states he is fine waiting until Monday 6/13 but will call if he needs sooner appt. Nothing further needed at this time.

## 2021-02-17 ENCOUNTER — Encounter: Payer: Self-pay | Admitting: Internal Medicine

## 2021-02-17 ENCOUNTER — Other Ambulatory Visit: Payer: Self-pay

## 2021-02-17 ENCOUNTER — Ambulatory Visit: Payer: PPO | Admitting: Internal Medicine

## 2021-02-17 VITALS — BP 146/78 | HR 60 | Temp 97.9°F | Ht 69.0 in | Wt 201.4 lb

## 2021-02-17 DIAGNOSIS — J449 Chronic obstructive pulmonary disease, unspecified: Secondary | ICD-10-CM | POA: Diagnosis not present

## 2021-02-17 MED ORDER — ALBUTEROL SULFATE HFA 108 (90 BASE) MCG/ACT IN AERS: 2.0000 | INHALATION_SPRAY | Freq: Four times a day (QID) | RESPIRATORY_TRACT | 5 refills | Status: DC | PRN

## 2021-02-17 MED ORDER — BREZTRI AEROSPHERE 160-9-4.8 MCG/ACT IN AERO: 2.0000 | INHALATION_SPRAY | Freq: Two times a day (BID) | RESPIRATORY_TRACT | 5 refills | Status: DC

## 2021-02-17 NOTE — Patient Instructions (Addendum)
The patient should have follow up scheduled with myself in 4 months.   Start taking Breztri 2 puffs Twice a day. Gargle after use.   Understanding COPD   What is COPD? COPD stands for chronic obstructive pulmonary (lung) disease. COPD is a general term used for several lung diseases.  COPD is an umbrella term and encompasses other  common diseases in this group like chronic bronchitis and emphysema. Chronic asthma may also be included in this group. While some patients with COPD have only chronic bronchitis or emphysema, most patients have a combination of both.  You might hear these terms used in exchange for one another.   COPD adds to the work of the heart. Diseased lungs may reduce the amount of oxygen that goes to the blood. High blood pressure in blood vessels from the heart to the lungs makes it difficult for the heart to pump. Lung disease can also cause the body to produce too many red blood cells which may make the blood thicker and harder to pump.   Patients who have COPD with low oxygen levels may develop an enlarged heart (cor pulmonale). This condition weakens the heart and causes increased shortness of breath and swelling in the legs and feet.   Chronic bronchitis Chronic bronchitis is irritation and inflammation (swelling) of the lining in the bronchial tubes (air passages). The irritation causes coughing and an excess amount of mucus in the airways. The swelling makes it difficult to get air in and out of the lungs. The small, hair-like structures on the inside of the airways (called cilia) may be damaged by the irritation. The cilia are then unable to help clean mucus from the airways.  Bronchitis is generally considered to be chronic when you have: a productive cough (cough up mucus) and shortness of breath that lasts about 3 months or more each year for 2 or more years in a row. Your doctor may define chronic bronchitis differently.   Emphysema Emphysema is the destruction, or  breakdown, of the walls of the alveoli (air sacs) located at the end of the bronchial tubes. The damaged alveoli are not able to exchange oxygen and carbon dioxide between the lungs and the blood. The bronchioles lose their elasticity and collapse when you exhale, trapping air in the lungs. The trapped air keeps fresh air and oxygen from entering the lungs.   Who is affected by COPD? Emphysema and chronic bronchitis affect approximately 16 million people in the [place], or close to 11 percent of the population.   Symptoms of COPD  Shortness of breath  Shortness of breath with mild exercise (walking, using the stairs, etc.)  Chronic, productive cough (with mucus)  A feeling of "tightness" in the chest  Wheezing   What causes COPD? The two primary causes of COPD are cigarette smoking and alpha1-antitrypsin (AAT) deficiency. Air pollution and occupational dusts may also contribute to COPD, especially when the person exposed to these substances is a cigarette smoker.  Cigarette smoke causes COPD by irritating the airways and creating inflammation that narrows the airways, making it more difficult to breathe. Cigarette smoke also causes the cilia to stop working properly so mucus and trapped particles are not cleaned from the airways. As a result, chronic cough and excess mucus production develop, leading to chronic bronchitis.  In some people, chronic bronchitis and infections can lead to destruction of the small airways, or emphysema.  AAT deficiency, an inherited disorder, can also lead to emphysema. Alpha antitrypsin (AAT)  is a protective material produced in the liver and transported to the lungs to help combat inflammation. When there is not enough of the chemical AAT, the body is no longer protected from an enzyme in the white blood cells.   How is COPD diagnosed?  To diagnose COPD, the physician needs to know: Do you smoke?  Have you had chronic exposure to dust or air pollutants?  Do  other members of your family have lung disease?  Are you short of breath?  Do you get short of breath with exercise?  Do you have chronic cough and/or wheezing?  Do you cough up excess mucus?  To help with the diagnosis, the physician will conduct a thorough physical exam which includes:  Listening to your lungs and heart  Checking your blood pressure and pulse  Examining your nose and throat  Checking your feet and ankles for swelling   Laboratory and other tests Several laboratory and other tests are needed to confirm a diagnosis of COPD. These tests may include:  Chest X-ray to look for lung changes that could be caused by COPD   Spirometry and pulmonary function tests (PFTs) to determine lung volume and air flow  Pulse oximetry to measure the saturation of oxygen in the blood  Arterial blood gases (ABGs) to determine the amount of oxygen and carbon dioxide in the blood  Exercise testing to determine if the oxygen level in the blood drops during exercise   Treatment In the beginning stages of COPD, there is minimal shortness of breath that may be noticed only during exercise. As the disease progresses, shortness of breath may worsen and you may need to wear an oxygen device.   To help control other symptoms of COPD, the following treatments and lifestyle changes may be prescribed.  Quitting smoking  Avoiding cigarette smoke and other irritants  Taking medications including: a. bronchodilators b. anti-inflammatory agents c. oxygen d. antibiotics  Maintaining a healthy diet  Following a structured exercise program such as pulmonary rehabilitation Preventing respiratory infections  Controlling stress   If your COPD progresses, you may be eligible to be evaluated for lung volume reduction surgery or lung transplantation. You may also be eligible to participate in certain clinical trials (research studies). Ask your health care providers about studies being conducted in your  hospital.   What is the outlook? Although COPD can not be cured, its symptoms can be treated and your quality of life can be improved. Your prognosis or outlook for the future will depend on how well your lungs are functioning, your symptoms, and how well you respond to and follow your treatment plan.

## 2021-02-17 NOTE — Progress Notes (Signed)
Nicolas Butler    [id number]    1951-04-02  Primary Care Physician:Boyd, Grace Bushy, PA Date of Appointment: 08/20/2020 Established Patient Visit  Chief complaint:   Chief Complaint  Patient presents with   Follow-up    Worsening shortness of breath with any exertion for past 2 months.      HPI: Nicolas Butler is a 70 y.o. gentleman with COPD and a right upper lobe lung mass. 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 Roxan Hockey on 07/22/2020.   Interval Updates: Had acute exacerbation in December 2021. Here today with worsening symptoms of COPD. Here with his daughter  Worsening shortness of breath, chest tightness, cough which is dry, wheezing. This is over the past 2-3 months. He has followed up with Dr. Delton Coombes and declined adjuvant chemo although it was recommended.   Current therapy: albuterol prn nebs only.    I have reviewed the patient's family social and past medical history and updated as appropriate.   Past Medical History:  Diagnosis Date   AAA (abdominal aortic aneurysm) (HCC)    CHF (congestive heart failure) (HCC)    Chronic kidney disease (CKD), stage III (moderate) (HCC)    Complication of anesthesia    Bowel and bladder are slow to wake up after surgery   Coronary artery disease    presumed   Dyspnea    exertion   High cholesterol    Unspecified essential hypertension     Past Surgical History:  Procedure Laterality Date   ABDOMINAL AORTIC ANEURYSM REPAIR N/A 06/28/2017   Procedure: ANEURYSM ABDOMINAL AORTIC REPAIR OPEN;  Surgeon: Elam Dutch, MD;  Location: [place];  Service: Vascular;  Laterality: N/A;   BRONCHIAL BRUSHINGS  07/02/2020   Procedure: BRONCHIAL BRUSHINGS;  Surgeon: Spero Geralds, MD;  Location: [facility];  Service: Pulmonary;;   BRONCHIAL WASHINGS  07/02/2020   Procedure: BRONCHIAL WASHINGS;  Surgeon: Spero Geralds, MD;  Location: [facility];  Service:  Pulmonary;;  BAL and washings   HAND SURGERY Left    HERNIA REPAIR     INCISIONAL HERNIA REPAIR     LEFT   INTERCOSTAL NERVE BLOCK Right 07/22/2020   Procedure: INTERCOSTAL NERVE BLOCK;  Surgeon: Melrose Nakayama, MD;  Location: [place];  Service: Thoracic;  Laterality: Right;   IRRIGATION AND DEBRIDEMENT SEBACEOUS CYST     FROM RIGHT SHOULDER   LUNG BIOPSY  07/02/2020   Procedure: LUNG BIOPSY;  Surgeon: Spero Geralds, MD;  Location: [facility];  Service: Pulmonary;;   NODE DISSECTION Right 07/22/2020   Procedure: NODE DISSECTION;  Surgeon: Melrose Nakayama, MD;  Location: [place];  Service: Thoracic;  Laterality: Right;   UMBILICAL HERNIA REPAIR     VIDEO BRONCHOSCOPY Right 07/02/2020   Procedure: VIDEO BRONCHOSCOPY WITHOUT FLUORO;  Surgeon: Spero Geralds, MD;  Location: [facility];  Service: Pulmonary;  Laterality: Right;    Family History  Problem Relation Age of Onset   Pneumonia Mother    Other Father        brain tumor   Diabetes Son    Lung cancer Neg Hx     Social History   Occupational History   Not on file  Tobacco Use   Smoking status: Former Smoker    Packs/day: 1.50    Years: 46.00    Pack years: 69.00    Types: Cigarettes    Start date: 05/17/1965  Quit date: 12/08/2012    Years since quitting: 7.7   Smokeless tobacco: Never Used  Vaping Use   Vaping Use: Never used  Substance and Sexual Activity   Alcohol use: No    Alcohol/week: 0.0 standard drinks   Drug use: No   Sexual activity: Not on file     Physical Exam: Blood pressure (!) 146/78, pulse 60, temperature 97.9 F (36.6 C), temperature source Temporal, height _0  (1.753 m), weight 201 lb 6.4 oz (91.4 kg), SpO2 97 %.  Gen: well nourished, no distress CV: RRR no mrg Resp: diminished bilaterally, no wheezes or crackles   Data Reviewed: Imaging: CT Chest April 2022 shows 2 nodules in the RLL which are 25m and require follow up.   PFTs:  PFT Results Latest Ref Rng & Units  07/19/2020  FVC-Pre L 3.43  FVC-Predicted Pre % 79  FVC-Post L 3.93  FVC-Predicted Post % 91  Pre FEV1/FVC % % 52  Post FEV1/FCV % % 52  FEV1-Pre L 1.79  FEV1-Predicted Pre % 56  FEV1-Post L 2.05  DLCO uncorrected ml/min/mmHg 23.12  DLCO UNC% % 91  DLCO corrected ml/min/mmHg 22.34  DLCO COR %Predicted % 88  DLVA Predicted % 101  TLC L 9.14  TLC % Predicted % 133  RV % Predicted % 239   I have personally reviewed the patient's PFTs and show moderately severe airflow limitation.   Labs: Lab Results  Component Value Date   WBC 6.5 12/18/2020   HGB 16.5 12/18/2020   HCT 47.4 12/18/2020   MCV 96.5 12/18/2020   PLT 177 12/18/2020     Immunization status: Immunization History  Administered Date(s) Administered   Tdap 01/21/2016    Assessment:  COPD, progressive symptoms Stage 2B lung cancer (SCC) s/p RUL lobectomy Nov 2021, declined adjuvant chemotherapy Pulmonary nodules   Plan/Recommendations: We discussed his reasons for not wanting to do chemotherapy - he doesn't want to feel sick. Discussed pros and cons of getting chemo with surgery, and he continues to not want it at this time.  Start Breztri inhaler 2 puffs twice a day.  Continue albuterol prn nebs, will add MDI as well.  Follow up scan ordered for 617mnodules  Return to Care: Return in about 4 months (around 06/19/2021).   NiLenice LlamasMD Pulmonary and [facility]

## 2021-03-06 DIAGNOSIS — E7849 Other hyperlipidemia: Secondary | ICD-10-CM | POA: Diagnosis not present

## 2021-03-06 DIAGNOSIS — N183 Chronic kidney disease, stage 3 unspecified: Secondary | ICD-10-CM | POA: Diagnosis not present

## 2021-03-06 DIAGNOSIS — I129 Hypertensive chronic kidney disease with stage 1 through stage 4 chronic kidney disease, or unspecified chronic kidney disease: Secondary | ICD-10-CM | POA: Diagnosis not present

## 2021-03-07 IMAGING — DX DG CHEST 1V PORT
1 series · 1 of 1 positions shown · non-contrast
Comparison: July 23, 2020

CLINICAL DATA: Postoperative lobectomy with chest tube present

EXAM:
PORTABLE CHEST 1 VIEW

[chest ap]
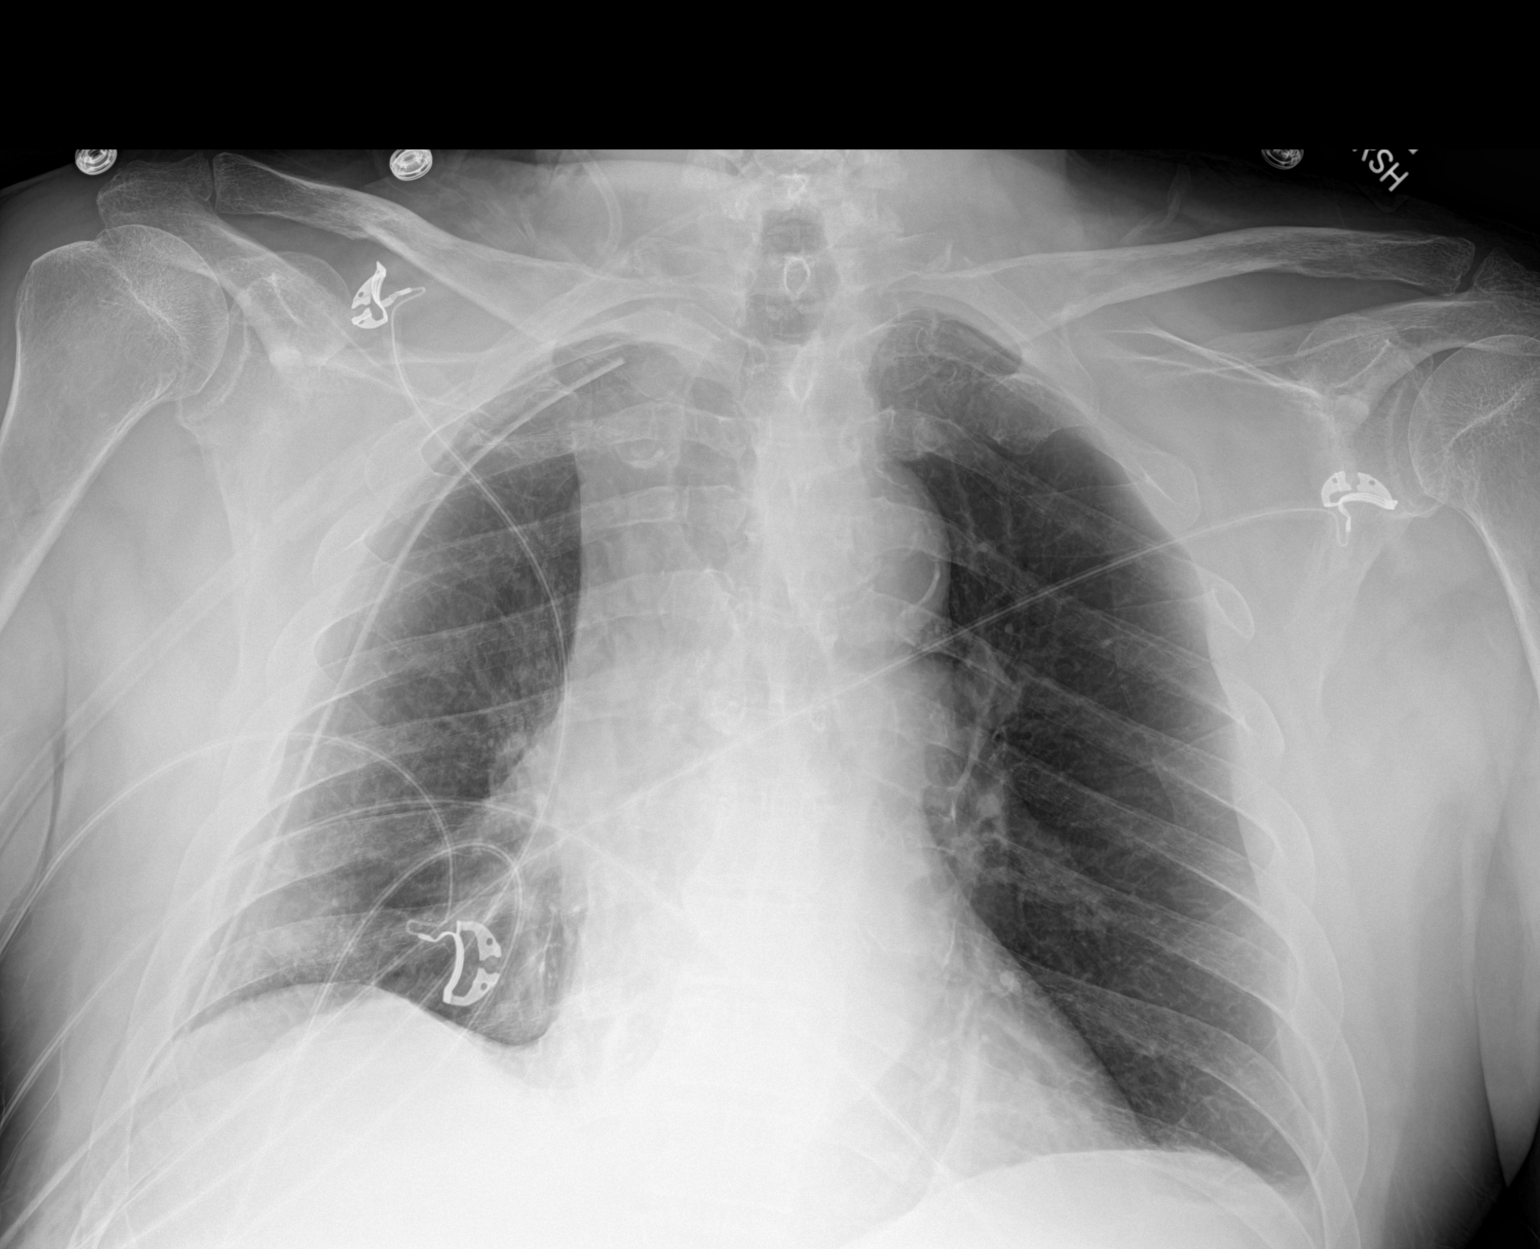

[1 of 1 positions shown; findings below may reference images not displayed]

FINDINGS: Chest tube is present on the right with tip directed toward the
medial right apex. No pneumothorax. Postoperative change with volume
loss noted on the right. There is a minimal left pleural effusion
with mild left base atelectasis. Lungs elsewhere clear. Heart size
is normal. Pulmonary vascularity is normal. No adenopathy. There is
aortic atherosclerosis. No bone lesions.
IMPRESSION: Chest tube as described on the right without pneumothorax.
Postoperative change with volume loss on the right, stable. Mild
left base atelectasis with small left pleural effusion. No new
opacity evident. Stable cardiac silhouette.

Aortic Atherosclerosis ([id number]).

## 2021-03-08 IMAGING — DX DG CHEST 1V PORT
1 series · 1 of 1 positions shown · non-contrast
Comparison: 07/24/2020.

CLINICAL DATA: Pneumothorax.  Chest tube.

EXAM:
PORTABLE CHEST 1 VIEW

[chest ap]
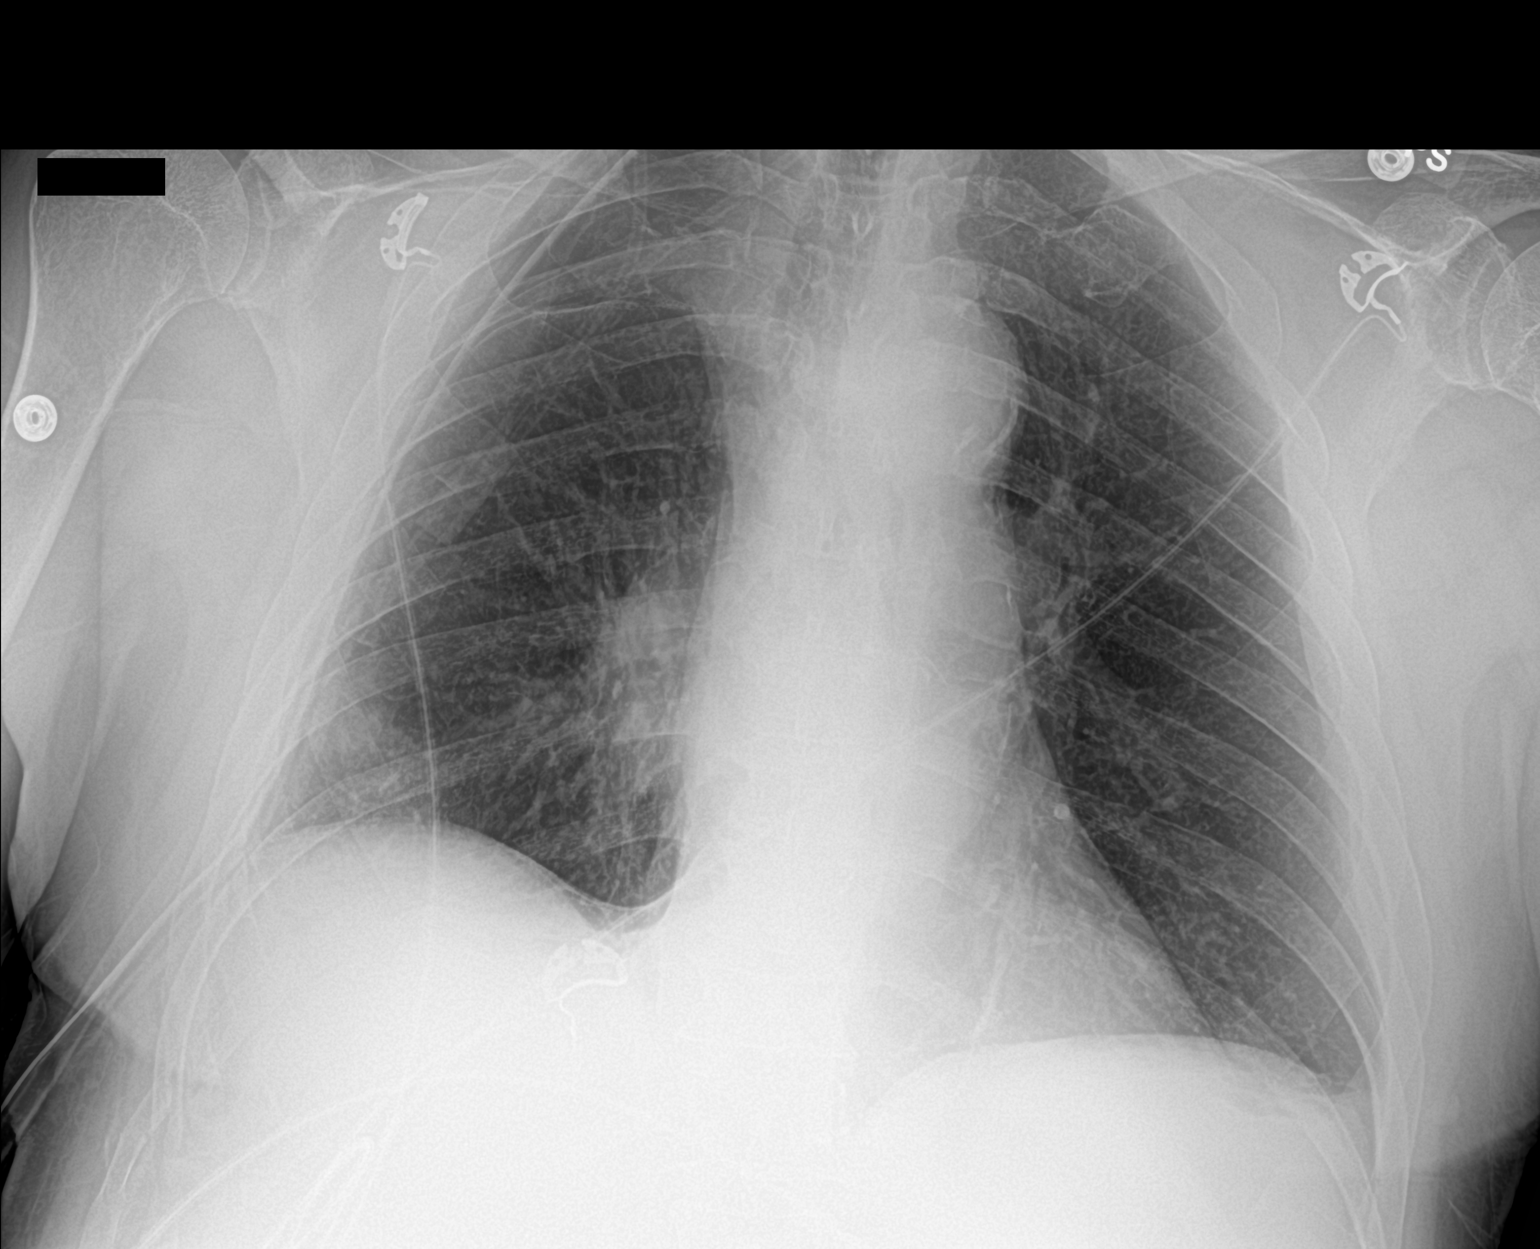

[1 of 1 positions shown; findings below may reference images not displayed]

FINDINGS: Right chest tube in stable position. No definite pneumothorax noted.
Mild bibasilar atelectasis. Mild right base infiltrate cannot be
excluded. Tiny bilateral pleural effusions cannot be excluded. Heart
size normal. No acute bony abnormality.
IMPRESSION: 1. Right chest tube in stable position. No definite pneumothorax
noted.
2. Mild bibasilar atelectasis. Mild right base infiltrate cannot be
excluded. Tiny bilateral pleural effusions cannot be excluded.

## 2021-03-09 IMAGING — DX DG CHEST 1V PORT
1 series · 1 of 1 positions shown · non-contrast
Comparison: 07/25/2020.

CLINICAL DATA: Status post lobectomy.  Chest tube.

EXAM:
PORTABLE CHEST 1 VIEW

[chest]
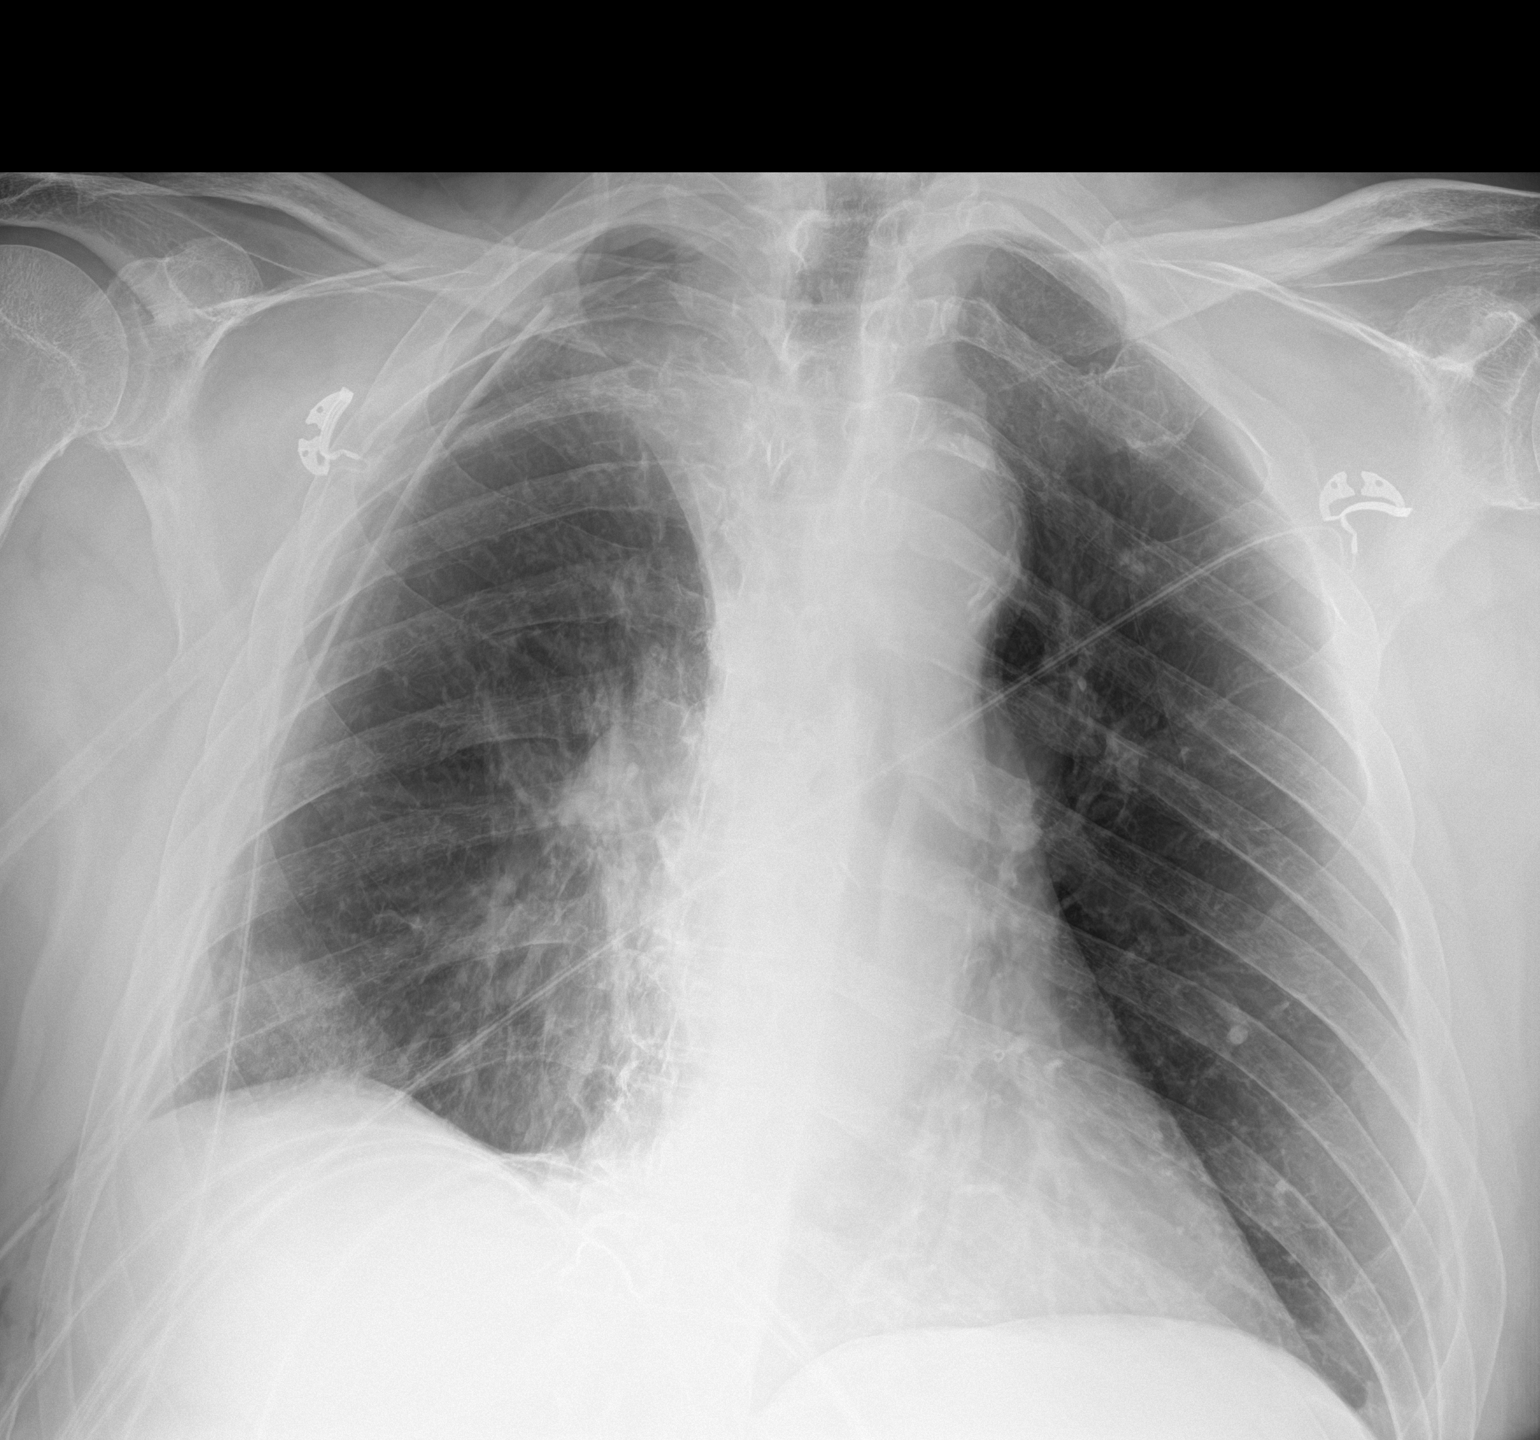

[1 of 1 positions shown; findings below may reference images not displayed]

FINDINGS: Postsurgical changes right lung. Right chest tube in stable
position. Tiny right apical pneumothorax cannot be excluded on
today's exam. Mild atelectasis/infiltrate right lung base. No
prominent pleural effusion noted. Left costophrenic angle
incompletely imaged. Heart size stable.
IMPRESSION: 1. Postsurgical changes right lung. Right chest tube in stable
position. Tiny right apical pneumothorax cannot be excluded on
today's exam.
2. Mild atelectasis/infiltrate right lung base.

## 2021-03-21 DIAGNOSIS — J449 Chronic obstructive pulmonary disease, unspecified: Secondary | ICD-10-CM | POA: Diagnosis not present

## 2021-03-21 DIAGNOSIS — Z902 Acquired absence of lung [part of]: Secondary | ICD-10-CM | POA: Diagnosis not present

## 2021-03-21 DIAGNOSIS — I10 Essential (primary) hypertension: Secondary | ICD-10-CM | POA: Diagnosis not present

## 2021-03-21 DIAGNOSIS — R739 Hyperglycemia, unspecified: Secondary | ICD-10-CM | POA: Diagnosis not present

## 2021-03-21 DIAGNOSIS — E7849 Other hyperlipidemia: Secondary | ICD-10-CM | POA: Diagnosis not present

## 2021-03-21 DIAGNOSIS — E782 Mixed hyperlipidemia: Secondary | ICD-10-CM | POA: Diagnosis not present

## 2021-03-21 DIAGNOSIS — Z6829 Body mass index (BMI) 29.0-29.9, adult: Secondary | ICD-10-CM | POA: Diagnosis not present

## 2021-03-21 DIAGNOSIS — N183 Chronic kidney disease, stage 3 unspecified: Secondary | ICD-10-CM | POA: Diagnosis not present

## 2021-04-14 ENCOUNTER — Other Ambulatory Visit: Payer: Self-pay | Admitting: Thoracic Surgery (Cardiothoracic Vascular Surgery)

## 2021-04-14 DIAGNOSIS — C3411 Malignant neoplasm of upper lobe, right bronchus or lung: Secondary | ICD-10-CM

## 2021-04-15 ENCOUNTER — Ambulatory Visit
Admission: RE | Admit: 2021-04-15 | Discharge: 2021-04-15 | Disposition: A | Discharge status: Home / Self Care | Payer: PPO | Source: Ambulatory Visit | Attending: Thoracic Surgery (Cardiothoracic Vascular Surgery) | Admitting: Thoracic Surgery (Cardiothoracic Vascular Surgery)

## 2021-04-15 ENCOUNTER — Encounter: Payer: Self-pay | Admitting: Thoracic Surgery (Cardiothoracic Vascular Surgery)

## 2021-04-15 ENCOUNTER — Ambulatory Visit: Payer: PPO | Admitting: Thoracic Surgery (Cardiothoracic Vascular Surgery)

## 2021-04-15 ENCOUNTER — Other Ambulatory Visit: Payer: Self-pay

## 2021-04-15 VITALS — BP 160/90 | HR 64 | Resp 20 | Ht 69.0 in | Wt 203.0 lb

## 2021-04-15 DIAGNOSIS — Z09 Encounter for follow-up examination after completed treatment for conditions other than malignant neoplasm: Secondary | ICD-10-CM | POA: Diagnosis not present

## 2021-04-15 DIAGNOSIS — C3411 Malignant neoplasm of upper lobe, right bronchus or lung: Secondary | ICD-10-CM

## 2021-04-15 DIAGNOSIS — Z902 Acquired absence of lung [part of]: Secondary | ICD-10-CM | POA: Diagnosis not present

## 2021-04-15 DIAGNOSIS — Z9889 Other specified postprocedural states: Secondary | ICD-10-CM | POA: Diagnosis not present

## 2021-04-15 DIAGNOSIS — Z85118 Personal history of other malignant neoplasm of bronchus and lung: Secondary | ICD-10-CM | POA: Diagnosis not present

## 2021-04-15 NOTE — Progress Notes (Signed)
Nicolas Butler [address]       Nicolas Butler,Nicolas Butler [id number]             [phone]      HPI: Nicolas Butler returns for a scheduled follow-up visit  Nicolas Butler is a 70 year old man with a past history of tobacco abuse (quit in 2014), hypertension, hyperlipidemia, COPD, heart failure, stage III chronic kidney disease, and a AAA.  He was found to have a right upper lobe lung mass on a low-dose screening CT.  I did a robotic right upper lobectomy on 07/22/2020.  Pathology showed a T3, N0, stage IIb squamous cell carcinoma.  He did well postoperatively and went home on day 5.  He saw Dr. Delton Coombes.  Adjuvant chemotherapy was recommended but he refused.  He is currently on observation.  He was having a lot of issues with his breathing in May and June.  He saw Dr. Shearon Stalls and was started on [facility].  He felt dramatically better the next day and has not been having any respiratory issues since then.  He says he currently feels better than he did prior to surgery.  No incisional discomfort.  Past Medical History:  Diagnosis Date   AAA (abdominal aortic aneurysm) (HCC)    CHF (congestive heart failure) (HCC)    Chronic kidney disease (CKD), stage III (moderate) (HCC)    Complication of anesthesia    Bowel and bladder are slow to wake up after surgery   COPD (chronic obstructive pulmonary disease) (HCC)    Coronary artery disease    presumed   Dyspnea    exertion   High cholesterol    Lung cancer (HCC)    Unspecified essential hypertension       Current Outpatient Medications  Medication Sig Dispense Refill   albuterol (PROVENTIL) (2.5 MG/3ML) 0.083% nebulizer solution Take 2.5 mg by nebulization every 6 (six) hours as needed for wheezing or shortness of breath.      albuterol (VENTOLIN HFA) 108 (90 Base) MCG/ACT inhaler Inhale 2 puffs into the lungs every 6 (six) hours as needed. 18 g 5   atorvastatin (LIPITOR) 80 MG tablet Take 80 mg by mouth every other day. AT NIGHT      Budeson-Glycopyrrol-Formoterol (BREZTRI AEROSPHERE) 160-9-4.8 MCG/ACT AERO Inhale 2 puffs into the lungs in the morning and at bedtime. 10.7 g 5   carvedilol (COREG) 3.125 MG tablet Take 1 tablet (3.125 mg total) by mouth 2 (two) times daily. 60 tablet 6   Cholecalciferol (VITAMIN D3) 5000 units CAPS Take 10,000 Units by mouth daily after breakfast.      lisinopril (PRINIVIL,ZESTRIL) 40 MG tablet Take 1 tablet (40 mg total) by mouth daily. 30 tablet 6   No current facility-administered medications for this visit.    Physical Exam BP (!) 160/90   Pulse 64   Resp 20   Ht 5\' 9"  (1.753 m)   Wt 203 lb (92.1 kg)   SpO2 98% Comment: RA  BMI 29.38 kg/m  70 year old man in no acute distress Alert and oriented x3 with no focal deficits Lungs slightly diminished at right base but otherwise clear, no rales or wheezing Cardiac regular rate and rhythm No cervical she clavicular adenopathy Incisions well-healed  Diagnostic Tests: I personally reviewed his chest x-ray images.  No change from February.  Postoperative changes from right upper lobectomy.  Impression: Nicolas Butler is a 70 year old man with a past history of tobacco abuse (quit in 2014), hypertension, hyperlipidemia, COPD, heart failure,  stage III chronic kidney disease, and a AAA.  He was found to have a right upper lobe lung mass on a low-dose screening CT. he underwent a robotic assisted right upper lobectomy and node dissection in November 2021.  Final pathology was T3, N0, stage IIb.  He refused adjuvant therapy.  Currently is under observation.  He has an appointment with Dr. Delton Coombes next week.  He will have a CT scan at that time.  His chest x-ray today looks good.  He does not have any pain or other ill effects associated with his prior surgery.   His COPD symptoms have improved dramatically with the initiation of Breztri.  Overall he is doing very well.  Plan: Follow-up with Dr. Delton Coombes I will be happy to see Mr.  Butler back anytime in the future if I can be of any further assistance with his care  Nicolas Nakayama, MD [facility] [phone]

## 2021-04-22 ENCOUNTER — Ambulatory Visit (HOSPITAL_COMMUNITY)
Admission: RE | Admit: 2021-04-22 | Discharge: 2021-04-22 | Disposition: A | Discharge status: Home / Self Care | Payer: PPO | Source: Ambulatory Visit | Attending: Hematology | Admitting: Hematology

## 2021-04-22 ENCOUNTER — Inpatient Hospital Stay (HOSPITAL_COMMUNITY): Payer: PPO | Attending: Hematology

## 2021-04-22 ENCOUNTER — Other Ambulatory Visit: Payer: Self-pay

## 2021-04-22 DIAGNOSIS — R918 Other nonspecific abnormal finding of lung field: Secondary | ICD-10-CM | POA: Diagnosis not present

## 2021-04-22 DIAGNOSIS — N189 Chronic kidney disease, unspecified: Secondary | ICD-10-CM | POA: Insufficient documentation

## 2021-04-22 DIAGNOSIS — J439 Emphysema, unspecified: Secondary | ICD-10-CM | POA: Diagnosis not present

## 2021-04-22 DIAGNOSIS — C3490 Malignant neoplasm of unspecified part of unspecified bronchus or lung: Secondary | ICD-10-CM | POA: Diagnosis not present

## 2021-04-22 DIAGNOSIS — C3491 Malignant neoplasm of unspecified part of right bronchus or lung: Secondary | ICD-10-CM

## 2021-04-22 DIAGNOSIS — R911 Solitary pulmonary nodule: Secondary | ICD-10-CM | POA: Diagnosis not present

## 2021-04-22 DIAGNOSIS — I700 Atherosclerosis of aorta: Secondary | ICD-10-CM | POA: Diagnosis not present

## 2021-04-22 DIAGNOSIS — C3411 Malignant neoplasm of upper lobe, right bronchus or lung: Secondary | ICD-10-CM | POA: Diagnosis not present

## 2021-04-22 LAB — CBC WITH DIFFERENTIAL/PLATELET
Abs Immature Granulocytes: 0.01 10*3/uL (ref 0.00–0.07)
Basophils Absolute: 0.1 10*3/uL (ref 0.0–0.1)
Basophils Relative: 1 %
Eosinophils Absolute: 0.3 10*3/uL (ref 0.0–0.5)
Eosinophils Relative: 3 %
HCT: 43.2 % (ref 39.0–52.0)
Hemoglobin: 15.2 g/dL (ref 13.0–17.0)
Immature Granulocytes: 0 %
Lymphocytes Relative: 24 %
Lymphs Abs: 1.7 10*3/uL (ref 0.7–4.0)
MCH: 35.1 pg — ABNORMAL HIGH (ref 26.0–34.0)
MCHC: 35.2 g/dL (ref 30.0–36.0)
MCV: 99.8 fL (ref 80.0–100.0)
Monocytes Absolute: 0.5 10*3/uL (ref 0.1–1.0)
Monocytes Relative: 7 %
Neutro Abs: 4.7 10*3/uL (ref 1.7–7.7)
Neutrophils Relative %: 65 %
Platelets: 164 10*3/uL (ref 150–400)
RBC: 4.33 MIL/uL (ref 4.22–5.81)
RDW: 12.9 % (ref 11.5–15.5)
WBC: 7.3 10*3/uL (ref 4.0–10.5)
nRBC: 0 % (ref 0.0–0.2)

## 2021-04-22 LAB — COMPREHENSIVE METABOLIC PANEL
ALT: 16 U/L (ref 0–44)
AST: 18 U/L (ref 15–41)
Albumin: 4 g/dL (ref 3.5–5.0)
Alkaline Phosphatase: 73 U/L (ref 38–126)
Anion gap: 5 (ref 5–15)
BUN: 19 mg/dL (ref 8–23)
CO2: 27 mmol/L (ref 22–32)
Calcium: 8.8 mg/dL — ABNORMAL LOW (ref 8.9–10.3)
Chloride: 103 mmol/L (ref 98–111)
Creatinine, Ser: 1.59 mg/dL — ABNORMAL HIGH (ref 0.61–1.24)
GFR, Estimated: 47 mL/min — ABNORMAL LOW (ref >60)
Glucose, Bld: 89 mg/dL (ref 70–99)
Potassium: 3.8 mmol/L (ref 3.5–5.1)
Sodium: 135 mmol/L (ref 135–145)
Total Bilirubin: 0.9 mg/dL (ref 0.3–1.2)
Total Protein: 7.3 g/dL (ref 6.5–8.1)

## 2021-04-22 MED ORDER — IOHEXOL 350 MG/ML SOLN
100.0000 mL | Freq: Once | INTRAVENOUS | Status: AC | PRN
  Administered 2021-04-22: 75 mL via INTRAVENOUS

## 2021-04-24 DIAGNOSIS — N183 Chronic kidney disease, stage 3 unspecified: Secondary | ICD-10-CM | POA: Diagnosis not present

## 2021-04-24 DIAGNOSIS — Z0001 Encounter for general adult medical examination with abnormal findings: Secondary | ICD-10-CM | POA: Diagnosis not present

## 2021-04-24 DIAGNOSIS — J449 Chronic obstructive pulmonary disease, unspecified: Secondary | ICD-10-CM | POA: Diagnosis not present

## 2021-04-24 DIAGNOSIS — Z87891 Personal history of nicotine dependence: Secondary | ICD-10-CM | POA: Diagnosis not present

## 2021-04-24 DIAGNOSIS — I10 Essential (primary) hypertension: Secondary | ICD-10-CM | POA: Diagnosis not present

## 2021-04-24 DIAGNOSIS — E7849 Other hyperlipidemia: Secondary | ICD-10-CM | POA: Diagnosis not present

## 2021-04-24 DIAGNOSIS — I714 Abdominal aortic aneurysm, without rupture: Secondary | ICD-10-CM | POA: Diagnosis not present

## 2021-04-26 NOTE — Progress Notes (Signed)
Nicolas Butler, Nicolas Butler [id number]   CLINIC:  Medical Oncology/Hematology  PCP:  Nicolas Lemons, PA [address] 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  PRIOR THERAPY: Right upper lobectomy and lymph node dissection on 07/22/2020  NGS Results: Foundation 1 MS-stable, TMB 19 Muts/Mb, PD-L1 TPS 0%  CURRENT THERAPY: surveillance  BRIEF ONCOLOGIC HISTORY:  Oncology History  Malignant neoplasm of upper lobe of right lung ([place])  07/11/2020 Initial Diagnosis   Malignant neoplasm of upper lobe of right lung ([place])   07/25/2020 Cancer Staging   Staging form: Lung, AJCC 8th Edition - Pathologic stage from 07/25/2020: Stage IIB (pT3, pN0, cM0) - Signed by Nicolas Butler on 07/25/2020   09/20/2020 Genetic Testing   PD-L1 Results:     09/23/2020 -  Chemotherapy   The patient had PALONOSETRON HCL INJECTION 0.25 MG/5ML, 0.25 mg, Intravenous,  Once, 0 of 4 cycles pegfilgrastim-jmdb (FULPHILA) injection 6 mg, 6 mg, Subcutaneous,  Once, 0 of 4 cycles CARBOplatin (PARAPLATIN) in sodium chloride 0.9 % 100 mL chemo infusion, , Intravenous,  Once, 0 of 4 cycles FOSAPREPITANT IV INFUSION 150 MG, 150 mg, Intravenous,  Once, 0 of 4 cycles PACLitaxel (TAXOL) 414 mg in sodium chloride 0.9 % 500 mL chemo infusion (> 79m/m2), 200 mg/m2, Intravenous,  Once, 0 of 4 cycles   for chemotherapy treatment.     09/26/2020 Genetic Testing   Foundation One Results:       CANCER STAGING: Cancer Staging Malignant ne[place] of [place] lobe of right lung [place] Staging form: Lung, AJCC 8th Edition - Clinical stage from 07/11/2020: Stage IA3 (cT1c, cN0, cM0) - Unsigned - Pathologic stage from 07/25/2020: Stage IIB (pT3, pN0, cM0) - Signed by HMelrose Nakayama Butler on 07/25/2020   INTERVAL HISTORY:  [name] a 70y.o. male, returns for routine follow-up of his  right squamous cell  lung carcinoma. Nicolas Butler last seen on 12/23/20.   Today he reports feeling good. He is using Breztri inhaler: 2 puff in the morning and 2 at night which has resolved his SOB.   REVIEW OF SYSTEMS:  Review of Systems  Constitutional:  Negative for appetite change and fatigue.  Respiratory:  Negative for shortness of breath.    PAST MEDICAL/SURGICAL HISTORY:  Past Medical History:  Diagnosis Date   AAA (abdominal aortic aneurysm) (HCC)    CHF (congestive heart failure) (HCC)    Chronic kidney disease (CKD), stage III (moderate) (HCC)    Complication of anesthesia    Bowel and bladder are slow to wake up after surgery   COPD (chronic obstructive pulmonary disease) (HCC)    Coronary artery disease    presumed   Dyspnea    exertion   High cholesterol    [place]ung cancer ([place]    Unspecified essential hypertension    Past Surgical History:  Procedure Laterality Date   ABDOMINAL AORTIC ANEURYSM REPAIR N/A 06/28/2017   Procedure: ANEURYSM ABDOMINAL AORTIC REPAIR OPEN;  Surgeon: Nicolas Dutch Butler;  [facility]  Service: Vascular;  Laterality: N/A;   BRONCHIAL BRUSHINGS  07/02/2020   Procedure: BRONCHIAL BRUSHINGS;  Surgeon: Nicolas Geralds Butler;  Location: [facility];  Service: Pulmonary;;   BRONCHIAL WASHINGS  07/02/2020   Procedure: BRONCHIAL WASHINGS;  Surgeon: Nicolas Geralds Butler;  Location: [facility];  Service: Pulmonary;;  BAL and washings   HAND SURGERY  Left    HERNIA REPAIR     INCISIONAL HERNIA REPAIR     LEFT   INTERCOSTAL NERVE BLOCK Right 07/22/2020   Procedure: INTERCOSTAL NERVE BLOCK;  Surgeon: Nicolas Butler;  Location: [facility];  Service: Thoracic;  Laterality: Right;   IRRIGATION AND DEBRIDEMENT SEBACEOUS CYST     FROM RIGHT SHOULDER   LUNG BIOPSY  07/02/2020   Procedure: LUNG BIOPSY;  Surgeon: Nicolas Geralds, Butler;  Location: [facility];  Service: Pulmonary;;   NODE DISSECTION Right 07/22/2020   Procedure: NODE DISSECTION;  Surgeon: Nicolas Butler;  Location: [facility];  Service: Thoracic;  Laterality: Right;   UMBILICAL HERNIA REPAIR     VIDEO BRONCHOSCOPY Right 07/02/2020   Procedure: VIDEO BRONCHOSCOPY WITHOUT FLUORO;  Surgeon: Nicolas Geralds, Butler;  Location: [facility];  Service: Pulmonary;  Laterality: Right;    SOCIAL HISTORY:  Social History   Socioeconomic History   Marital status: Legally Separated    Spouse name: Not on file   Number of children: 2   Years of education: Not on file   Highest education level: Not on file  Occupational History   Occupation: retired  Tobacco Use   Smoking status: Former    Packs/day: 1.50    Years: 46.00    Pack years: 69.00    Types: Cigarettes    Start date: 05/17/1965    Quit date: 12/08/2012    Years since quitting: 8.3   Smokeless tobacco: Never  Vaping Use   Vaping Use: Never used  Substance and Sexual Activity   Alcohol use: No    Alcohol/week: 0.0 standard drinks   Drug use: No   Sexual activity: Not Currently  Other Topics Concern   Not on file  Social History Narrative   Not on file   Social Determinants of Health   Financial Resource Strain: Low Risk    Difficulty of Paying Living Expenses: Not hard at all  Food Insecurity: No Food Insecurity   Worried About Charity fundraiser in the Last Year: Never true   [place] in the Last Year: Never true  Transportation Needs: No Transportation Needs   Lack of Transportation (Medical): No   Lack of Transportation (Non-Medical): No  Physical Activity: Insufficiently Active   Days of Exercise per Week: 1 day   Minutes of Exercise per Session: 20 min  Stress: No Stress Concern Present   Feeling of Stress : Not at all  Social Connections: Moderately Isolated   Frequency of Communication with Friends and Family: Three times a week   Frequency of Social Gatherings with Friends and Family: Twice a week   Attends Religious Services: More than 4 times per year   Active Member of [employer] or Organizations:  No   Attends Music therapist: Never   Marital Status: Separated  Intimate Partner Violence: Not At Risk   Fear of Current or Ex-Partner: No   Emotionally Abused: No   Physically Abused: No   Sexually Abused: No    FAMILY HISTORY:  Family History  Problem Relation Age of Onset   Pneumonia Mother    Other Father        brain tumor   Diabetes Son    Lung cancer Neg Hx     CURRENT MEDICATIONS:  Current Outpatient Medications  Medication Sig Dispense Refill   albuterol (PROVENTIL) (2.5 MG/3ML) 0.083% nebulizer solution Take 2.5 mg by nebulization every 6 (six) hours as needed  for wheezing or shortness of breath.      albuterol (VENTOLIN HFA) 108 (90 Base) MCG/ACT inhaler Inhale 2 puffs into the lungs every 6 (six) hours as needed. 18 g 5   atorvastatin (LIPITOR) 80 MG tablet Take 80 mg by mouth every other day. AT NIGHT     Budeson-Glycopyrrol-Formoterol (BREZTRI AEROSPHERE) 160-9-4.8 MCG/ACT AERO Inhale 2 puffs into the lungs in the morning and at bedtime. 10.7 g 5   carvedilol (COREG) 3.125 MG tablet Take 1 tablet (3.125 mg total) by mouth 2 (two) times daily. 60 tablet 6   Cholecalciferol (VITAMIN D3) 5000 units CAPS Take 10,000 Units by mouth daily after breakfast.      lisinopril (PRINIVIL,ZESTRIL) 40 MG tablet Take 1 tablet (40 mg total) by mouth daily. 30 tablet 6   No current facility-administered medications for this visit.    ALLERGIES:  Allergies  Allergen Reactions   Hydrochlorothiazide Other (See Comments)    Pt states "it made my legs hurt"   Pravastatin Other (See Comments)    Muscle pain    PHYSICAL EXAM:  Performance status (ECOG): 1 - Symptomatic but completely ambulatory  There were no vitals filed for this visit. Wt Readings from Last 3 Encounters:  04/15/21 203 lb (92.1 kg)  02/17/21 201 lb 6.4 oz (91.4 kg)  12/23/20 201 lb 9.6 oz (91.4 kg)   Physical Exam   LABORATORY DATA:  I have reviewed the labs as listed.  CBC Latest Ref Rng  & Units 04/22/2021 12/18/2020 09/02/2020  WBC 4.0 - 10.5 K/uL 7.3 6.5 8.7  Hemoglobin 13.0 - 17.0 g/dL 15.2 16.5 15.9  Hematocrit 39.0 - 52.0 % 43.2 47.4 46.9  Platelets 150 - 400 K/uL 164 177 234   CMP Latest Ref Rng & Units 04/22/2021 12/18/2020 12/18/2020  Glucose 70 - 99 mg/dL 89 93 -  BUN 8 - 23 mg/dL 19 19 -  Creatinine 0.61 - 1.24 mg/dL 1.59(H) 1.41(H) 1.60(H)  Sodium 135 - 145 mmol/L 135 133(L) -  Potassium 3.5 - 5.1 mmol/L 3.8 4.3 -  Chloride 98 - 111 mmol/L 103 101 -  CO2 22 - 32 mmol/L 27 23 -  Calcium 8.9 - 10.3 mg/dL 8.8(L) 8.8(L) -  Total Protein 6.5 - 8.1 g/dL 7.3 7.5 -  Total Bilirubin 0.3 - 1.2 mg/dL 0.9 1.5(H) -  Alkaline Phos 38 - 126 U/L 73 123 -  AST 15 - 41 U/L 18 17 -  ALT 0 - 44 U/L 16 17 -    DIAGNOSTIC IMAGING:  I have independently reviewed the scans and discussed with the patient. DG Chest 2 View  Result Date: 04/15/2021 CLINICAL DATA:  70 year old male with history of lung cancer status post right upper lobectomy in November 2021. Subsequent encounter. EXAM: CHEST - 2 VIEW COMPARISON:  Chest CT 12/18/2020 and earlier. FINDINGS: Stable reduced right lung volume following upper lobectomy. Stable cardiac size and mediastinal contours. Visualized tracheal air column is within normal limits. No pneumothorax, pulmonary edema, pleural effusion or acute pulmonary opacity. Stable visualized osseous structures. Negative visible bowel gas pattern. IMPRESSION: Stable postoperative chest.  No acute cardiopulmonary abnormality. Electronically Signed   By: Genevie Ann M.D.   On: 04/15/2021 11:24   CT Chest W Contrast  Result Date: 04/22/2021 CLINICAL DATA:  Non-small cell lung cancer in a 70 year old male, follow-up evaluation. EXAM: CT CHEST WITH CONTRAST TECHNIQUE: Multidetector CT imaging of the chest was performed during intravenous contrast administration. CONTRAST:  57m OMNIPAQUE IOHEXOL 350 MG/ML SOLN COMPARISON:  December 18, 2020. FINDINGS: Cardiovascular: Calcified and  noncalcified atheromatous plaque of the thoracic aorta. Stable 4.2 cm dilation of the ascending thoracic aorta. Central pulmonary vasculature stable on venous phase assessment. Cardiac size is stable without pericardial effusion. Calcified coronary artery disease of LEFT and RIGHT coronary circulation. Mediastinum/Nodes: No axillary lymphadenopathy. No thoracic inlet lymphadenopathy. No mediastinal or hilar lymphadenopathy. Lungs/Pleura: Post RIGHT upper lobectomy as before. Stable small RIGHT lower lobe pulmonary nodules (image 73/4) 5 mm RIGHT lower lobe pulmonary nodule. (Image 70/4) second 5-6 mm pulmonary nodule. Airways are patent. No effusion. No lobar consolidative changes. Centrilobular pulmonary emphysema as before. Upper Abdomen: Incidental imaging of upper abdominal contents without acute process. Stable thickening of the bilateral adrenal glands. Imaged portions the liver, pancreas, spleen and gastrointestinal tract are unremarkable. No visible upper abdominal adenopathy on very limited assessment. Musculoskeletal: No acute musculoskeletal process. Spinal degenerative changes. IMPRESSION: 1. Post RIGHT upper lobectomy as before. No new or progressive findings. 2. Stable RIGHT lower lobe pulmonary nodules. 3. Stable 4.2 cm dilation of the ascending thoracic aorta. Attention on follow-up or consider the following. Recommend annual imaging followup by CTA or MRA. This recommendation follows 15-Nov-2008 ACCF/AHA/AATS/ACR/ASA/SCA/SCAI/SIR/STS/SVM Guidelines for the Diagnosis and Management of Patients with Thoracic Aortic Disease. Circulation. Nov 15, 2008; 121: [id number]. Aortic aneurysm NOS (ICD10-I71.9) 4. Calcified coronary artery disease. 5. Aortic atherosclerosis and pulmonary emphysema. Aortic Atherosclerosis (ICD10-I70.0) and Emphysema (ICD10-J43.9). Electronically Signed   By: Zetta Bills M.D.   On: 04/22/2021 16:55     ASSESSMENT:  1.  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  Anteromedial right upper lobe distal airway enlargement appears FDG avid with SUV of 13.6 concerning for endobronchial spread of tumor.  No signs of FDG avid mediastinal or hilar adenopathy or distant metastatic disease.  Multiple tiny peripheral nodules identified within the right upper lobe.  Bilateral adrenal gland adenomas. -Bronchoscopy and biopsy on 07/02/2020 showed poorly differentiated squamous cell carcinoma. -Right upper lobectomy and lymph node dissection by Dr. Roxan Hockey on 07/22/2020. -Pathology showed 5.1 cm moderately differentiated squamous cell carcinoma involving right upper lobe bronchus, visceral pleura not involved.  Bronchovascular resection margins are negative.  Negative lymphatic or vascular invasion.  Lymph nodes at level 7, levels 9, 11, 12 are negative for carcinoma.  pT3 PN0.0/16 lymph nodes involved. - PD-L1 0% - Adjuvant chemotherapy was recommended but declined by the patient.   2.  Social/family history: -He lives at home by himself and is independent of all ADLs and IADLs. -He is accompanied by his daughter who works as a Marine scientist in cardiovascular surgery department. -He worked in [place] and quit smoking in 15-Nov-2012. -Brother died of metastatic cancer.  Maternal cousin had "bone cancer".   PLAN:  1.  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 - He reported improvement in his breathing after he started using Breztri twice daily. - We reviewed CT chest with contrast from 04/22/2021.  No new or progressive findings.  Stable right lower lobe pulmonary nodules.  Stable 4.2 cm ascending thoracic aortic dilatation. - Reviewed labs from 04/22/2021 which showed normal LFTs.  CKD stable.  CBC was normal. - Recommend follow-up in 4 months with repeat CT of the chest.  If it remains stable, we  will switch him to 84-monthvisits.   Orders placed  this encounter:  No orders of the defined types were placed in this encounter.    Derek Jack, [address] [phone]   I, Thana Ates, am acting as a scribe for Dr. Derek Jack.  I, Derek Jack Butler, have reviewed the above documentation for accuracy and completeness, and I agree with the above.

## 2021-04-28 ENCOUNTER — Other Ambulatory Visit: Payer: Self-pay

## 2021-04-28 ENCOUNTER — Inpatient Hospital Stay (HOSPITAL_COMMUNITY): Payer: PPO | Admitting: Hematology

## 2021-04-28 VITALS — BP 137/74 | HR 57 | Temp 97.9°F | Resp 16 | Wt 203.4 lb

## 2021-04-28 DIAGNOSIS — C3491 Malignant neoplasm of unspecified part of right bronchus or lung: Secondary | ICD-10-CM | POA: Diagnosis not present

## 2021-04-28 DIAGNOSIS — C3411 Malignant neoplasm of upper lobe, right bronchus or lung: Secondary | ICD-10-CM | POA: Diagnosis not present

## 2021-04-28 NOTE — Patient Instructions (Addendum)
[place] at [facility] Discharge Instructions  You were seen today by Dr. Delton Coombes. He went over your recent results. You will be scheduled for a CT scan of your chest prior to you next visit. Dr. Delton Coombes will see you back in 4 months for labs and follow up.   Thank you for choosing [place] at [facility] to provide your oncology and hematology care.  To afford each patient quality time with our provider, please arrive at least 15 minutes before your scheduled appointment time.   If you have a lab appointment with the [facility] please come in thru the Main Entrance and check in at the main information desk  You need to re-schedule your appointment should you arrive 10 or more minutes late.  We strive to give you quality time with our providers, and arriving late affects you and other patients whose appointments are after yours.  Also, if you no show three or more times for appointments you may be dismissed from the clinic at the providers discretion.     Again, thank you for choosing [facility].  Our hope is that these requests will decrease the amount of time that you wait before being seen by our physicians.       _____________________________________________________________  Should you have questions after your visit to [facility], please contact our office at [phone] between the hours of 8:00 a.m. and 4:30 p.m.  Voicemails left after 4:00 p.m. will not be returned until the following business day.  For prescription refill requests, have your pharmacy contact our office and allow 72 hours.    Cancer Center Support Programs:   > Cancer Support Group  2nd Tuesday of the month 1pm-2pm, Journey Room

## 2021-05-01 NOTE — Progress Notes (Signed)
Cardiology Office Note  Date: 05/02/2021   ID: Nicolas, Butler [birth year], MRN [id number]  PCP:  Nicolas Lemons, PA  Cardiologist:  Nicolas Dolly, MD Electrophysiologist:  None   Chief Complaint: Follow up  History of Present Illness: Nicolas Butler is a 70 y.o. male with a history of AAA, CHF, CKD, CAD, COPD, HLD,  Tobacco abuse, Lung Ca, HTN, DOE.  He was last seen by Nicolas Butler on 05/08/2019.  He was walking regularly without shortness of breath or DOE.  No recent edema.  Presumed CAD based on echo and an previous stress test.  Lexiscan on 03/26/2017 with inferior/inferior apical defect with mild to moderate peri-infarct ischemia.  No recent chest pain he was followed by vascular for AAA.  He was status post open repair on October 2018.  Vascular wanted a repeat CT in 08/26/2022.  He was followed by nephrology for CKD stage III.  Last labs by PCP creatinine down to 1.3.  He was tolerating Lipitor for hyperlipidemia on Lipitor 80 mg every other day.  He did not tolerate daily dosing PCP labs were requested.Marland Kitchen  He was compliant with his antihypertensive medications.  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 He saw Nicolas Butler and adjuvant chemotherapy was recommended but he refused. He was having issues with breathing in May and June. He was started on Breztri. Breathing significantly improved after starting Breztri.   He recently had a follow-up CT scan On April 22, 2021 post right upper lobe lobectomy.  There was evidence of a stable 4.2 cm dilation of the ascending aorta.  Patient states he previous had an abdominal aortic aneurysm which was repaired by Nicolas Butler in the past and he would like to be referred back to Nicolas Butler for follow-up and evaluation/surveillance of newly found dilatation of ascending thoracic aorta.  He states he is feeling much better since he started on Breztri.  He had  seen Nicolas Butler, pulmonology, on 02/17/2021 for COPD with bronchitis and emphysema.  He had PFTs which showed moderately severe airflow limitation.  He started taking Breztri 2 puffs twice a day and states it has done wonders for his breathing since starting.  He otherwise denies any anginal or exertional symptoms, DOE or SOB.  Denies any palpitations or arrhythmias, orthostatic symptoms, CVA or TIA-like symptoms.  No PND or orthopnea.  Denies any bleeding.  Claudication-like symptoms, DVT or PE-like symptoms, or lower extremity edema.  He states based on the recent discovery of 4.2 cm dilated thoracic aorta he would like to be referred to Nicolas Butler who performed his previous aneurysm repair.   Past Medical History:  Diagnosis Date   AAA (abdominal aortic aneurysm) (HCC)    CHF (congestive heart failure) (HCC)    Chronic kidney disease (CKD), stage III (moderate) (HCC)    Complication of anesthesia    Bowel and bladder are slow to wake up after surgery   COPD (chronic obstructive pulmonary disease) (HCC)    Coronary artery disease    presumed   Dyspnea    exertion   High cholesterol    Lung cancer ([place])    Unspecified essential hypertension     Past Surgical History:  Procedure Laterality Date   ABDOMINAL AORTIC ANEURYSM REPAIR N/A 06/28/2017   Procedure: ANEURYSM ABDOMINAL AORTIC REPAIR OPEN;  Surgeon: Nicolas Dutch, MD;  Location: [place];  Service: Vascular;  Laterality: N/A;  BRONCHIAL BRUSHINGS  07/02/2020   Procedure: BRONCHIAL BRUSHINGS;  Surgeon: Nicolas Geralds, MD;  Location: [facility];  Service: Pulmonary;;   BRONCHIAL WASHINGS  07/02/2020   Procedure: BRONCHIAL WASHINGS;  Surgeon: Nicolas Geralds, MD;  Location: [facility];  Service: Pulmonary;;  BAL and washings   HAND SURGERY Left    HERNIA REPAIR     INCISIONAL HERNIA REPAIR     LEFT   INTERCOSTAL NERVE BLOCK Right 07/22/2020   Procedure: INTERCOSTAL NERVE BLOCK;  Surgeon: Nicolas Nakayama, MD;  Location:  [place];  Service: Thoracic;  Laterality: Right;   IRRIGATION AND DEBRIDEMENT SEBACEOUS CYST     FROM RIGHT SHOULDER   LUNG BIOPSY  07/02/2020   Procedure: LUNG BIOPSY;  Surgeon: Nicolas Geralds, MD;  Location: [facility];  Service: Pulmonary;;   NODE DISSECTION Right 07/22/2020   Procedure: NODE DISSECTION;  Surgeon: Nicolas Nakayama, MD;  Location: [place];  Service: Thoracic;  Laterality: Right;   UMBILICAL HERNIA REPAIR     VIDEO BRONCHOSCOPY Right 07/02/2020   Procedure: VIDEO BRONCHOSCOPY WITHOUT FLUORO;  Surgeon: Nicolas Geralds, MD;  Location: [facility];  Service: Pulmonary;  Laterality: Right;    Current Outpatient Medications  Medication Sig Dispense Refill   albuterol (PROVENTIL) (2.5 MG/3ML) 0.083% nebulizer solution Take 2.5 mg by nebulization every 6 (six) hours as needed for wheezing or shortness of breath.      albuterol (VENTOLIN HFA) 108 (90 Base) MCG/ACT inhaler Inhale 2 puffs into the lungs every 6 (six) hours as needed. 18 g 5   amLODipine (NORVASC) 10 MG tablet Take 10 mg by mouth daily.     atorvastatin (LIPITOR) 80 MG tablet Take 80 mg by mouth every other day. AT NIGHT     Budeson-Glycopyrrol-Formoterol (BREZTRI AEROSPHERE) 160-9-4.8 MCG/ACT AERO Inhale 2 puffs into the lungs in the morning and at bedtime. 10.7 g 5   carvedilol (COREG) 3.125 MG tablet Take 1 tablet (3.125 mg total) by mouth 2 (two) times daily. 60 tablet 6   Cholecalciferol (VITAMIN D3) 5000 units CAPS Take 10,000 Units by mouth daily after breakfast.      lisinopril (PRINIVIL,ZESTRIL) 40 MG tablet Take 1 tablet (40 mg total) by mouth daily. 30 tablet 6   No current facility-administered medications for this visit.   Allergies:  Hydrochlorothiazide and Pravastatin   Social History: The patient  reports that he quit smoking about 8 years ago. His smoking use included cigarettes. He started smoking about 55 years ago. He has a 69.00 pack-year smoking history. He has never used smokeless  tobacco. He reports that he does not drink alcohol and does not use drugs.   Family History: The patient's family history includes Diabetes in his son; Other in his father; Pneumonia in his mother.   ROS:  Please see the history of present illness. Otherwise, complete review of systems is positive for none.  All other systems are reviewed and negative.   Physical Exam: VS:  BP (!) 144/84   Pulse (!) 59   Ht _0  (1.753 m)   Wt 204 lb 9.6 oz (92.8 kg)   SpO2 96%   BMI 30.21 kg/m , BMI Body mass index is 30.21 kg/m.  Wt Readings from Last 3 Encounters:  05/02/21 204 lb 9.6 oz (92.8 kg)  04/28/21 203 lb 6.4 oz (92.3 kg)  04/15/21 203 lb (92.1 kg)    General: Patient appears comfortable at rest. Neck: Supple, no elevated JVP or carotid bruits, no thyromegaly.  Lungs: Clear to auscultation, nonlabored breathing at rest. Cardiac: Regular rate and rhythm, no S3 or significant systolic murmur, no pericardial rub. Extremities: No pitting edema, distal pulses 2+. Skin: Warm and dry. Musculoskeletal: No kyphosis. Neuropsychiatric: Alert and oriented x3, affect grossly appropriate.  ECG:    Recent Labwork: 07/26/2020: B Natriuretic Peptide 59.9 04/22/2021: ALT 16; AST 18; BUN 19; Creatinine, Ser 1.59; Hemoglobin 15.2; Platelets 164; Potassium 3.8; Sodium 135  No results found for: CHOL, TRIG, HDL, CHOLHDL, VLDL, LDLCALC, LDLDIRECT  Other Studies Reviewed Today:  Chest CT 04/22/2021 IMPRESSION: 1. Post RIGHT upper lobectomy as before. No new or progressive findings. 2. Stable RIGHT lower lobe pulmonary nodules. 3. Stable 4.2 cm dilation of the ascending thoracic aorta. Attention on follow-up or consider the following. Recommend annual imaging followup by CTA or MRA. This recommendation follows 2010 ACCF/AHA/AATS/ACR/ASA/SCA/SCAI/SIR/STS/SVM Guidelines for the Diagnosis and Management of Patients with Thoracic Aortic Disease. Circulation. 2010; 121: L875-I433. Aortic aneurysm NOS  (ICD10-I71.9) 4. Calcified coronary artery disease. 5. Aortic atherosclerosis and pulmonary emphysema.   Aortic Atherosclerosis (ICD10-I70.0) and Emphysema (ICD10-J43.9).    CT chest 12/18/2020 IMPRESSION: 1. Surgical changes from a right upper lobe lobectomy. No findings suspicious for residual or recurrent tumor. 2. No mediastinal or hilar mass or adenopathy. 3. Two new adjacent 6 mm pulmonary nodules in the right lower lobe. Indeterminate finding. Recommend follow-up noncontrast chest CT in 3-4 months. 4. Stable bilateral adrenal gland nodules, likely benign adenomas. 5. Stable advanced atherosclerotic calcifications involving the thoracic and abdominal aorta and branch vessels including the coronary arteries. 6. Cholelithiasis. 7. Emphysema and aortic atherosclerosis.   Aortic Atherosclerosis (ICD10-I70.0) and Emphysema (ICD10-J43.9). Both   02/2014 MPI   Raw images showed appropriate radiotracer uptake. There is a   moderate-sized mildly reversible inferoapical and inferolateral wall   defect. There are no other myocardial perfusion defects. There is   mild inferolateral wall hypokinesis.   Gated imaging shows end-diastolic volume 295 mL, and systolic volume   90 mL, left ventricular ejection fraction 40%.   IMPRESSION:   1. Abnormal Lexiscan MPI   2. Moderate sized mildl intenstiry inferoapcail and inferolateral   wall infarcts with mild peri-infarct ischemia.   3. Decreased left ventricular systolic function, LVEF 18%   4. Increased study for major cardiac events due to decreased LV   systolic function, there is fairly mild myocardium at jeopardy    02/2014 Echo LVEF 40-45%, abnormal diastolic function. Multiple WMAs,   03/2017 MPI Nuclear stress EF: 61%. There was no ST segment deviation noted during stress. Defect 1: There is a defect present in the basal inferior, basal inferolateral, mid inferior, mid inferolateral and apical inferior location. Findings  consistent with ischemia. This is an intermediate risk study. The left ventricular ejection fraction is normal (55-65%).   There is a large area, moderate severity defect in the basal and mid inferolateral, inferior and apical inferior walls (SDS=6) consistent with ischemia in the LCX territory.    04/2017 echo Study Conclusions   - Left ventricle: The cavity size was normal. Wall thickness was   increased in a pattern of moderate LVH. Systolic function was   normal. The estimated ejection fraction was in the range of 60%   to 65%. Wall motion was normal; there were no regional wall   motion abnormalities. Doppler parameters are consistent with   abnormal left ventricular relaxation (grade 1 diastolic   dysfunction). - Aortic valve: Valve area (VTI): 3.52 cm^2. Valve area (Vmax):   3.11 cm^2. Valve  area (Vmean): 3.12 cm^2. - Aorta: Aortic root dimension: 40 mm (ED). Ascending aortic   diameter: 40 mm (S). - Aortic root: The aortic root was mildly dilated. - Left atrium: The atrium was moderately dilated. - Technically adequate study.       Assessment and Plan:  1. Chronic systolic heart failure ([place])   2. CAD in native artery   3. Abdominal aortic aneurysm (AAA) without rupture ([place])   4. Mixed hyperlipidemia   5. Essential hypertension   6. Ascending aortic aneurysm ([place])    1. Chronic systolic heart failure (HCC) Denies any DOE or SOB.  Denies any weight gain or lower extremity edema. Last echo on August 2018 showed improvement in EF to 60 to 65%.  He had moderate LVH.  No WMA's.  G1 DD.  Ascending aortic diameter was 40 mm.  Aortic root was mildly dilated.   2. CAD in native artery Denies any anginal or exertional symptoms.  Continue carvedilol 3.125 mg p.o. twice daily.  3. Abdominal aortic aneurysm (AAA) without rupture [facility]) Status post repair by Nicolas. Dr. Oneida Butler vascular surgery 2018.  No issues since that time.  4. Mixed hyperlipidemia Continue atorvastatin 80 mg  p.o. daily.  5. Essential hypertension Blood pressure elevated today at 144/84.  Patient states at home his systolic usually runs in 096G to 836O with diastolics in the 29U.  Continue amlodipine 10 mg daily.  Continue lisinopril 40 mg daily.  Continue carvedilol 3.125 mg p.o. twice daily.  6.  Dilated ascending thoracic aorta?  Aneurysm Recent discovery on follow-up CT of 4.2 cm dilated ascending thoracic aorta.  Patient would like to be referred back to vascular to see Nicolas Butler who performed his previous AAA aneurysm repair.  Please refer to Nicolas Butler vein and vascular.  Medication Adjustments/Labs and Tests Ordered: Current medicines are reviewed at length with the patient today.  Concerns regarding medicines are outlined above.   Disposition: Follow-up with Nicolas Butler or APP 6 months  Signed, Levell July, NP 05/02/2021 4:29 PM    [facility], [place], [place] [address] Phone: [phone]; Fax: [phone]

## 2021-05-02 ENCOUNTER — Ambulatory Visit: Payer: PPO | Admitting: Family Medicine

## 2021-05-02 ENCOUNTER — Encounter: Payer: Self-pay | Admitting: Family Medicine

## 2021-05-02 ENCOUNTER — Other Ambulatory Visit: Payer: Self-pay

## 2021-05-02 VITALS — BP 144/84 | HR 59 | Ht 69.0 in | Wt 204.6 lb

## 2021-05-02 DIAGNOSIS — I5022 Chronic systolic (congestive) heart failure: Secondary | ICD-10-CM

## 2021-05-02 DIAGNOSIS — I714 Abdominal aortic aneurysm, without rupture, unspecified: Secondary | ICD-10-CM

## 2021-05-02 DIAGNOSIS — I10 Essential (primary) hypertension: Secondary | ICD-10-CM | POA: Diagnosis not present

## 2021-05-02 DIAGNOSIS — I2510 Atherosclerotic heart disease of native coronary artery without angina pectoris: Secondary | ICD-10-CM

## 2021-05-02 DIAGNOSIS — I7121 Aneurysm of the ascending aorta, without rupture: Secondary | ICD-10-CM

## 2021-05-02 DIAGNOSIS — I712 Thoracic aortic aneurysm, without rupture: Secondary | ICD-10-CM

## 2021-05-02 DIAGNOSIS — E782 Mixed hyperlipidemia: Secondary | ICD-10-CM

## 2021-05-02 NOTE — Patient Instructions (Signed)
Medication Instructions:  Continue all current medications.   Labwork: none  Testing/Procedures: none  Follow-Up: 6 months   Any Other Special Instructions Will Be Listed Below (If Applicable).  You have been referred to:  Vascular   If you need a refill on your cardiac medications before your next appointment, please call your pharmacy.

## 2021-05-07 DIAGNOSIS — E7849 Other hyperlipidemia: Secondary | ICD-10-CM | POA: Diagnosis not present

## 2021-05-07 DIAGNOSIS — I129 Hypertensive chronic kidney disease with stage 1 through stage 4 chronic kidney disease, or unspecified chronic kidney disease: Secondary | ICD-10-CM | POA: Diagnosis not present

## 2021-05-07 DIAGNOSIS — N183 Chronic kidney disease, stage 3 unspecified: Secondary | ICD-10-CM | POA: Diagnosis not present

## 2021-05-12 NOTE — Progress Notes (Signed)
[facility] [place]  ASSESSMENT / PLAN: Nicolas Butler is a 70 y.o. male with an ascending aortic aneurysm measuring 33m. He is status post open repair of abdominal aortic aneurysm in 2018 with Dr. FOneida Alar He needs a CT angiogram of the chest / abdomen / pelvis in 1 year for aortic aneurysm surveillance. He has established care with a cardiac surgeon (Dr. HRoxan Hockey, and should follow up with him for surveillance of small ascending aortic aneurysm. I counseled him extensively regarding the very low (<1%) risk of aneurysm rupture over the next year.   Recommend the following to reduce the risk of major adverse cardiac / limb events.  Complete cessation from all tobacco products. Excellent blood glucose control with goal A1c < 7%. Blood pressure control with goal blood pressure < 140/90 mmHg. Excellent lipid reduction therapy with goal LDL-C <100 mg/dL. Aspirin 881mPO QD.  Atorvastatin 40-80mg PO QD (or other "high intensity" statin therapy).  CHIEF COMPLAINT: aneurysm  HISTORY OF PRESENT ILLNESS: Nicolas Butler a [age].o. male for to clinic for evaluation of new thoracic aortic aneurysm.  This was demonstrated on CT scan done in surveillance for lung cancer.  He is status post robotic assisted right upper lobectomy 07/22/2020.  Surveillance for lung cancer demonstrated 42 mm ascending aortic aneurysm.  Patient preferred [place] with Dr. FiOneida Alar He has no symptoms referable to his aneurysm.  He is due for total aortic surveillance next year.  Past Medical History:  Diagnosis Date   AAA (abdominal aortic aneurysm) (HCC)    CHF (congestive heart failure) (HCC)    Chronic kidney disease (CKD), stage III (moderate) (HCC)    Complication of anesthesia    Bowel and bladder are slow to wake up after surgery   COPD [place]chronic obstructive pulmonary disease) ([facility]   Coronary artery disease    presumed   Dyspnea    exertion   High  cholesterol    [place]ung cancer ([facility]   Unspecified essential hypertension     Past Surgical History:  Procedure Laterality Date   ABDOMINAL AORTIC ANEURYSM REPAIR N/A 06/28/2017   Procedure: ANEURYSM ABDOMINAL AORTIC REPAIR OPEN;  Surgeon: FiElam DutchMD;  [facility]: [facility];  Laterality: N/A;   BRONCHIAL BRUSHINGS  07/02/2020   Procedure: BRONCHIAL BRUSHINGS;  Surgeon: DeSpero GeraldsMD;  Location: [facility];  Service: Pulmonary;;   BRONCHIAL WASHINGS  07/02/2020   Procedure: BRONCHIAL WASHINGS;  Surgeon: DeSpero GeraldsMD;  Location: [facility];  Service: Pulmonary;;  BAL and washings   HAND SURGERY Left    HERNIA REPAIR     INCISIONAL HERNIA REPAIR     LEFT   INTERCOSTAL NERVE BLOCK Right 07/22/2020   Procedure: INTERCOSTAL NERVE BLOCK;  Surgeon: HeMelrose NakayamaMD;  [facility] Service: Thoracic;  Laterality: Right;   IRRIGATION AND DEBRIDEMENT SEBACEOUS CYST     FROM RIGHT SHOULDER   LUNG BIOPSY  07/02/2020   Procedure: LUNG BIOPSY;  Surgeon: DeSpero GeraldsMD;  [facility];  Service: Pulmonary;;   NODE DISSECTION Right 07/22/2020   Procedure: NODE DISSECTION;  Surgeon: HeMelrose NakayamaMD;  [facility] Service: Thoracic;  Laterality: Right;   UMBILICAL HERNIA REPAIR     VIDEO BRONCHOSCOPY Right 07/02/2020   Procedure: VIDEO BRONCHOSCOPY WITHOUT FLUORO;  Surgeon: DeSpero GeraldsMD;  Location: [facility];  Service: Pulmonary;  Laterality:  Right;    Family History  Problem Relation Age of Onset   Pneumonia Mother    Other Father        brain tumor   Diabetes Son    Lung cancer Neg Hx     Social History   Socioeconomic History   Marital status: Legally Separated    Spouse name: Not on file   Number of children: 2   Years of education: Not on file   Highest education level: Not on file  Occupational History   Occupation: retired  Tobacco Use   Smoking status: Former    Packs/day: 1.50     Years: 46.00    Pack years: 69.00    Types: Cigarettes    Start date: 05/17/1965    Quit date: 12/08/2012    Years since quitting: 8.4   Smokeless tobacco: Never  Vaping Use   Vaping Use: Never used  Substance and Sexual Activity   Alcohol use: No    Alcohol/week: 0.0 standard drinks   Drug use: No   Sexual activity: Not Currently  Other Topics Concern   Not on file  Social History Narrative   Not on file   Social Determinants of Health   Financial Resource Strain: Low Risk    Difficulty of Paying Living Expenses: Not hard at all  Food Insecurity: No Food Insecurity   Worried About Charity fundraiser in the Last Year: Never true   [place] in the Last Year: Never true  Transportation Needs: No Transportation Needs   Lack of Transportation (Medical): No   Lack of Transportation (Non-Medical): No  Physical Activity: Insufficiently Active   Days of Exercise per Week: 1 day   Minutes of Exercise per Session: 20 min  Stress: No Stress Concern Present   Feeling of Stress : Not at all  Social Connections: Moderately Isolated   Frequency of Communication with Friends and Family: Three times a week   Frequency of Social Gatherings with Friends and Family: Twice a week   Attends Religious Services: More than 4 times per year   Active Member of [employer] or Organizations: No   Attends Archivist Meetings: Never   Marital Status: Separated  Intimate Partner Violence: Not At Risk   Fear of Current or Ex-Partner: No   Emotionally Abused: No   Physically Abused: No   Sexually Abused: No    Allergies  Allergen Reactions   Hydrochlorothiazide Other (See Comments)    Pt states "it made my legs hurt"   Pravastatin Other (See Comments)    Muscle pain    Current Outpatient Medications  Medication Sig Dispense Refill   albuterol (PROVENTIL) (2.5 MG/3ML) 0.083% nebulizer solution Take 2.5 mg by nebulization every 6 (six) hours as needed for wheezing or shortness of  breath.      albuterol (VENTOLIN HFA) 108 (90 Base) MCG/ACT inhaler Inhale 2 puffs into the lungs every 6 (six) hours as needed. 18 g 5   amLODipine (NORVASC) 10 MG tablet Take 10 mg by mouth daily.     atorvastatin (LIPITOR) 80 MG tablet Take 80 mg by mouth every other day. AT NIGHT     Budeson-Glycopyrrol-Formoterol (BREZTRI AEROSPHERE) 160-9-4.8 MCG/ACT AERO Inhale 2 puffs into the lungs in the morning and at bedtime. 10.7 g 5   carvedilol (COREG) 3.125 MG tablet Take 1 tablet (3.125 mg total) by mouth 2 (two) times daily. 60 tablet 6   Cholecalciferol (VITAMIN D3) 5000 units CAPS Take  10,000 Units by mouth daily after breakfast.      lisinopril (PRINIVIL,ZESTRIL) 40 MG tablet Take 1 tablet (40 mg total) by mouth daily. 30 tablet 6   No current facility-administered medications for this visit.    REVIEW OF SYSTEMS:  _0  denotes positive finding, _1  denotes negative finding Cardiac  Comments:  Chest pain or chest pressure:    Shortness of breath upon exertion:    Short of breath when lying flat:    Irregular heart rhythm:        Vascular    Pain in calf, thigh, or hip brought on by ambulation:    Pain in feet at night that wakes you up from your sleep:     Blood clot in your veins:    Leg swelling:         Pulmonary    Oxygen at home:    Productive cough:     Wheezing:         Neurologic    Sudden weakness in arms or legs:     Sudden numbness in arms or legs:     Sudden onset of difficulty speaking or slurred speech:    Temporary loss of vision in one eye:     Problems with dizziness:         Gastrointestinal    Blood in stool:     Vomited blood:         Genitourinary    Burning when urinating:     Blood in urine:        Psychiatric    Major depression:         Hematologic    Bleeding problems:    Problems with blood clotting too easily:        Skin    Rashes or ulcers:        Constitutional    Fever or chills:      PHYSICAL EXAM Vitals:   05/13/21  1049  BP: (!) 153/84  Pulse: (!) 57  Resp: 20  Temp: 99 F (37.2 C)  SpO2: 95%  Weight: 203 lb (92.1 kg)  Height: _2  (1.753 m)    Constitutional: well appearing. no distress. Appears well nourished.  Neurologic: CN intact. no focal findings. no sensory loss. Psychiatric:  Mood and affect symmetric and appropriate. Eyes:  No icterus. No conjunctival pallor. Ears, nose, throat:  mucous membranes moist. Midline trachea.  Cardiac: regular rate and rhythm.  Respiratory:  unlabored. Abdominal:  soft, non-tender, non-distended. Upper midline hernia. Extremity: no edema. no cyanosis. no pallor.  Skin: no gangrene. no ulceration.  Lymphatic: no Stemmer's sign. no palpable lymphadenopathy.  PERTINENT LABORATORY AND RADIOLOGIC DATA  Most recent CBC CBC Latest Ref Rng & Units 04/22/2021 12/18/2020 09/02/2020  WBC 4.0 - 10.5 K/uL 7.3 6.5 8.7  Hemoglobin 13.0 - 17.0 g/dL 15.2 16.5 15.9  Hematocrit 39.0 - 52.0 % 43.2 47.4 46.9  Platelets 150 - 400 K/uL 164 177 234     Most recent CMP CMP Latest Ref Rng & Units 04/22/2021 12/18/2020 12/18/2020  Glucose 70 - 99 mg/dL 89 93 -  BUN 8 - 23 mg/dL 19 19 -  Creatinine 0.61 - 1.24 mg/dL 1.59(H) 1.41(H) 1.60(H)  Sodium 135 - 145 mmol/L 135 133(L) -  Potassium 3.5 - 5.1 mmol/L 3.8 4.3 -  Chloride 98 - 111 mmol/L 103 101 -  CO2 22 - 32 mmol/L 27 23 -  Calcium 8.9 - 10.3 mg/dL 8.8(L) 8.8(L) -  Total Protein  6.5 - 8.1 g/dL 7.3 7.5 -  Total Bilirubin 0.3 - 1.2 mg/dL 0.9 1.5(H) -  Alkaline Phos 38 - 126 U/L 73 123 -  AST 15 - 41 U/L 18 17 -  ALT 0 - 44 U/L 16 17 -    Renal function CrCl cannot be calculated (Patient's most recent lab result is older than the maximum 21 days allowed.).  No results found for: HGBA1C  No results found for: LDLCALC, LDLC, HIRISKLDL, POCLDL, LDLDIRECT, REALLDLC, TOTLDLC   CLINICAL DATA:  Non-small cell lung cancer in a 70 year old male, follow-up evaluation.   EXAM: CT CHEST WITH CONTRAST    TECHNIQUE: Multidetector CT imaging of the chest was performed during intravenous contrast administration.   CONTRAST:  48m OMNIPAQUE IOHEXOL 350 MG/ML SOLN   COMPARISON:  December 18, 2020.   FINDINGS: Cardiovascular: Calcified and noncalcified atheromatous plaque of the thoracic aorta. Stable 4.2 cm dilation of the ascending thoracic aorta.   Central pulmonary vasculature stable on venous phase assessment. Cardiac size is stable without pericardial effusion. Calcified coronary artery disease of LEFT and RIGHT coronary circulation.   Mediastinum/Nodes: No axillary lymphadenopathy. No thoracic inlet lymphadenopathy. No mediastinal or hilar lymphadenopathy.   Lungs/Pleura: Post RIGHT upper lobectomy as before. Stable small RIGHT lower lobe pulmonary nodules (image 73/4) 5 mm RIGHT lower lobe pulmonary nodule.   (Image 70/4) second 5-6 mm pulmonary nodule. Airways are patent. No effusion. No lobar consolidative changes. Centrilobular pulmonary emphysema as before.   Upper Abdomen: Incidental imaging of upper abdominal contents without acute process. Stable thickening of the bilateral adrenal glands. Imaged portions the liver, pancreas, spleen and gastrointestinal tract are unremarkable. No visible upper abdominal adenopathy on very limited assessment.   Musculoskeletal: No acute musculoskeletal process. Spinal degenerative changes.   IMPRESSION: 1. Post RIGHT upper lobectomy as before. No new or progressive findings. 2. Stable RIGHT lower lobe pulmonary nodules. 3. Stable 4.2 cm dilation of the ascending thoracic aorta. Attention on follow-up or consider the following. Recommend annual imaging followup by CTA or MRA. This recommendation follows 2010 ACCF/AHA/AATS/ACR/ASA/SCA/SCAI/SIR/STS/SVM Guidelines for the Diagnosis and Management of Patients with Thoracic Aortic Disease. Circulation. 2010; 121:: [id number] Aortic aneurysm NOS (ICD10-I71.9) 4. Calcified coronary artery  disease. 5. Aortic atherosclerosis and pulmonary emphysema.   Aortic Atherosclerosis (ICD10-I70.0) and Emphysema (ICD10-J43.9).     Electronically Signed   By: GZetta BillsM.D.   On: 04/22/2021 16:55  TYevonne Aline HStanford Breed MD [facility] Number: ([phone]/02/2021 11:37 AM  Total time spent on preparing this encounter including chart review, data review, collecting history, examining the patient, coordinating care for this established patient, 30 minutes.  Portions of this report may have been transcribed using voice recognition software.  Every effort has been made to ensure accuracy; however, inadvertent computerized transcription errors may still be present.

## 2021-05-13 ENCOUNTER — Ambulatory Visit: Payer: PPO | Admitting: Vascular Surgery

## 2021-05-13 ENCOUNTER — Other Ambulatory Visit: Payer: Self-pay

## 2021-05-13 ENCOUNTER — Encounter: Payer: Self-pay | Admitting: Vascular Surgery

## 2021-05-13 VITALS — BP 153/84 | HR 57 | Temp 99.0°F | Resp 20 | Ht 69.0 in | Wt 203.0 lb

## 2021-05-13 DIAGNOSIS — I712 Thoracic aortic aneurysm, without rupture, unspecified: Secondary | ICD-10-CM

## 2021-07-07 DIAGNOSIS — E7849 Other hyperlipidemia: Secondary | ICD-10-CM | POA: Diagnosis not present

## 2021-07-07 DIAGNOSIS — I129 Hypertensive chronic kidney disease with stage 1 through stage 4 chronic kidney disease, or unspecified chronic kidney disease: Secondary | ICD-10-CM | POA: Diagnosis not present

## 2021-07-07 DIAGNOSIS — N183 Chronic kidney disease, stage 3 unspecified: Secondary | ICD-10-CM | POA: Diagnosis not present

## 2021-08-11 ENCOUNTER — Other Ambulatory Visit: Payer: Self-pay | Admitting: Internal Medicine

## 2021-08-11 DIAGNOSIS — J449 Chronic obstructive pulmonary disease, unspecified: Secondary | ICD-10-CM

## 2021-08-13 NOTE — Telephone Encounter (Signed)
Called patient to schedule f/u with Dr. Shearon Stalls   [email or web address]

## 2021-08-13 NOTE — Telephone Encounter (Signed)
Please advise,  Would you like to continue and refill [email or web address]

## 2021-08-13 NOTE — Telephone Encounter (Signed)
Have refilled but please call him and get him on my schedule for follow up thanks!

## 2021-08-28 ENCOUNTER — Inpatient Hospital Stay (HOSPITAL_COMMUNITY): Payer: PPO | Attending: Hematology

## 2021-08-28 ENCOUNTER — Other Ambulatory Visit: Payer: Self-pay

## 2021-08-28 ENCOUNTER — Ambulatory Visit (HOSPITAL_COMMUNITY): Payer: PPO

## 2021-08-28 DIAGNOSIS — C3491 Malignant neoplasm of unspecified part of right bronchus or lung: Secondary | ICD-10-CM

## 2021-08-28 DIAGNOSIS — Z85118 Personal history of other malignant neoplasm of bronchus and lung: Secondary | ICD-10-CM | POA: Insufficient documentation

## 2021-08-28 LAB — CBC WITH DIFFERENTIAL/PLATELET
Abs Immature Granulocytes: 0.05 10*3/uL (ref 0.00–0.07)
Basophils Absolute: 0.1 10*3/uL (ref 0.0–0.1)
Basophils Relative: 1 %
Eosinophils Absolute: 0.4 10*3/uL (ref 0.0–0.5)
Eosinophils Relative: 4 %
HCT: 44.9 % (ref 39.0–52.0)
Hemoglobin: 15.8 g/dL (ref 13.0–17.0)
Immature Granulocytes: 1 %
Lymphocytes Relative: 25 %
Lymphs Abs: 2 10*3/uL (ref 0.7–4.0)
MCH: 34.6 pg — ABNORMAL HIGH (ref 26.0–34.0)
MCHC: 35.2 g/dL (ref 30.0–36.0)
MCV: 98.5 fL (ref 80.0–100.0)
Monocytes Absolute: 0.5 10*3/uL (ref 0.1–1.0)
Monocytes Relative: 6 %
Neutro Abs: 5.2 10*3/uL (ref 1.7–7.7)
Neutrophils Relative %: 63 %
Platelets: 177 10*3/uL (ref 150–400)
RBC: 4.56 MIL/uL (ref 4.22–5.81)
RDW: 12.7 % (ref 11.5–15.5)
WBC: 8.3 10*3/uL (ref 4.0–10.5)
nRBC: 0 % (ref 0.0–0.2)

## 2021-08-28 LAB — COMPREHENSIVE METABOLIC PANEL
ALT: 19 U/L (ref 0–44)
AST: 17 U/L (ref 15–41)
Albumin: 4.2 g/dL (ref 3.5–5.0)
Alkaline Phosphatase: 87 U/L (ref 38–126)
Anion gap: 7 (ref 5–15)
BUN: 17 mg/dL (ref 8–23)
CO2: 28 mmol/L (ref 22–32)
Calcium: 9.1 mg/dL (ref 8.9–10.3)
Chloride: 101 mmol/L (ref 98–111)
Creatinine, Ser: 1.43 mg/dL — ABNORMAL HIGH (ref 0.61–1.24)
GFR, Estimated: 53 mL/min — ABNORMAL LOW (ref >60)
Glucose, Bld: 90 mg/dL (ref 70–99)
Potassium: 4 mmol/L (ref 3.5–5.1)
Sodium: 136 mmol/L (ref 135–145)
Total Bilirubin: 0.7 mg/dL (ref 0.3–1.2)
Total Protein: 7.7 g/dL (ref 6.5–8.1)

## 2021-09-03 ENCOUNTER — Ambulatory Visit (HOSPITAL_COMMUNITY)
Admission: RE | Admit: 2021-09-03 | Discharge: 2021-09-03 | Disposition: A | Discharge status: Home / Self Care | Payer: PPO | Source: Ambulatory Visit | Attending: Hematology | Admitting: Hematology

## 2021-09-03 ENCOUNTER — Other Ambulatory Visit: Payer: Self-pay

## 2021-09-03 DIAGNOSIS — C3491 Malignant neoplasm of unspecified part of right bronchus or lung: Secondary | ICD-10-CM | POA: Diagnosis not present

## 2021-09-03 DIAGNOSIS — I700 Atherosclerosis of aorta: Secondary | ICD-10-CM | POA: Diagnosis not present

## 2021-09-03 DIAGNOSIS — R911 Solitary pulmonary nodule: Secondary | ICD-10-CM | POA: Diagnosis not present

## 2021-09-03 DIAGNOSIS — C3490 Malignant neoplasm of unspecified part of unspecified bronchus or lung: Secondary | ICD-10-CM | POA: Diagnosis not present

## 2021-09-03 DIAGNOSIS — I7121 Aneurysm of the ascending aorta, without rupture: Secondary | ICD-10-CM | POA: Diagnosis not present

## 2021-09-03 MED ORDER — IOHEXOL 300 MG/ML  SOLN
75.0000 mL | Freq: Once | INTRAMUSCULAR | Status: AC | PRN
  Administered 2021-09-03: 08:00:00 75 mL via INTRAVENOUS

## 2021-09-03 NOTE — Progress Notes (Signed)
Nicolas Butler, [place] [address]   CLINIC:  Medical Oncology/Hematology  PCP:  Lavella Lemons, PA [address] 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  PRIOR THERAPY: Right upper lobectomy and lymph node dissection on 07/22/2020  NGS Results: Foundation 1 MS-stable, TMB 19 Muts/Mb, PD-L1 TPS 0%  CURRENT THERAPY: surveillance  BRIEF ONCOLOGIC HISTORY:  Oncology History  Malignant neoplasm of upper lobe of right lung ([place])  07/11/2020 Initial Diagnosis   Malignant neoplasm of upper lobe of right lung ([place])   07/25/2020 Cancer Staging   Staging form: Lung, AJCC 8th Edition - Pathologic stage from 07/25/2020: Stage IIB (pT3, pN0, cM0) - Signed by Melrose Nakayama, MD on 07/25/2020    09/20/2020 Genetic Testing   PD-L1 Results:     09/23/2020 -  Chemotherapy   The patient had PALONOSETRON HCL INJECTION 0.25 MG/5ML, 0.25 mg, Intravenous,  Once, 0 of 4 cycles pegfilgrastim-jmdb (FULPHILA) injection 6 mg, 6 mg, Subcutaneous,  Once, 0 of 4 cycles CARBOplatin (PARAPLATIN) in sodium chloride 0.9 % 100 mL chemo infusion, , Intravenous,  Once, 0 of 4 cycles FOSAPREPITANT IV INFUSION 150 MG, 150 mg, Intravenous,  Once, 0 of 4 cycles PACLitaxel (TAXOL) 414 mg in sodium chloride 0.9 % 500 mL chemo infusion (> 72m/m2), 200 mg/m2, Intravenous,  Once, 0 of 4 cycles   for chemotherapy treatment.     09/26/2020 Genetic Testing   Foundation One Results:       CANCER STAGING:  Cancer Staging  [place] of right lung [place] Staging form: Lung, AJCC 8th Edition - Clinical stage from 07/11/2020: Stage IA3 (cT1c, cN0, cM0) - Unsigned - Pathologic stage from 07/25/2020: Stage IIB (pT3, pN0, cM0) - Signed by HMelrose Nakayama MD on 07/25/2020   INTERVAL HISTORY:  Nicolas Butler a 70y.o. male, returns for routine follow-up of his right squamous  cell lung carcinoma. JAddiswas last seen on 04/28/2021.   Today he reports feeling good. His SOB and cough is stable. He denies hemoptysis. His appetite is good.   REVIEW OF SYSTEMS:  Review of Systems  Constitutional:  Negative for appetite change and fatigue.  Respiratory:  Positive for cough (stable) and shortness of breath (COPD). Negative for hemoptysis.   All other systems reviewed and are negative.  PAST MEDICAL/SURGICAL HISTORY:  Past Medical History:  Diagnosis Date   AAA (abdominal aortic aneurysm) (HCC)    CHF (congestive heart failure) (HCC)    Chronic kidney disease (CKD), stage III (moderate) (HCC)    Complication of anesthesia    Bowel and bladder are slow to wake up after surgery   COPD (chronic obstructive pulmonary disease) (HCC)    Coronary artery disease    presumed   Dyspnea    exertion   High cholesterol    Lung cancer ([place]    Unspecified essential hypertension    Past Surgical History:  Procedure Laterality Date   ABDOMINAL AORTIC ANEURYSM REPAIR N/A 06/28/2017   Procedure: ANEURYSM ABDOMINAL AORTIC REPAIR OPEN;  Surgeon: FElam Dutch MD;  [facility]  Service: Vascular;  Laterality: N/A;   BRONCHIAL BRUSHINGS  07/02/2020   Procedure: BRONCHIAL BRUSHINGS;  Surgeon: DSpero Geralds MD;  [facility];  Service: Pulmonary;;   BRONCHIAL WASHINGS  07/02/2020   Procedure: BRONCHIAL WASHINGS;  Surgeon: DSpero Geralds MD;  [facility];  Service: Pulmonary;;  BAL and washings   HAND SURGERY Left    HERNIA REPAIR     INCISIONAL HERNIA REPAIR     LEFT   INTERCOSTAL NERVE BLOCK Right 07/22/2020   Procedure: INTERCOSTAL NERVE BLOCK;  Surgeon: Melrose Nakayama, MD;  Location: [facility];  Service: Thoracic;  Laterality: Right;   IRRIGATION AND DEBRIDEMENT SEBACEOUS CYST     FROM RIGHT SHOULDER   LUNG BIOPSY  07/02/2020   Procedure: LUNG BIOPSY;  Surgeon: Spero Geralds, MD;  [facility];  Service: Pulmonary;;    NODE DISSECTION Right 07/22/2020   Procedure: NODE DISSECTION;  Surgeon: Melrose Nakayama, MD;  Location: [facility];  Service: Thoracic;  Laterality: Right;   UMBILICAL HERNIA REPAIR     VIDEO BRONCHOSCOPY Right 07/02/2020   Procedure: VIDEO BRONCHOSCOPY WITHOUT FLUORO;  Surgeon: Spero Geralds, MD;  [facility];  Service: Pulmonary;  Laterality: Right;    SOCIAL HISTORY:  Social History   Socioeconomic History   Marital status: Legally Separated    Spouse name: Not on file   Number of children: 2   Years of education: Not on file   Highest education level: Not on file  Occupational History   Occupation: retired  Tobacco Use   Smoking status: Former    Packs/day: 1.50    Years: 46.00    Pack years: 69.00    Types: Cigarettes    Start date: 05/17/1965    Quit date: 12/08/2012    Years since quitting: 8.7   Smokeless tobacco: Never  Vaping Use   Vaping Use: Never used  Substance and Sexual Activity   Alcohol use: No    Alcohol/week: 0.0 standard drinks   Drug use: No   Sexual activity: Not Currently  Other Topics Concern   Not on file  Social History Narrative   Not on file   Social Determinants of Health   Financial Resource Strain: Not on file  Food Insecurity: Not on file  Transportation Needs: Not on file  Physical Activity: Not on file  Stress: Not on file  Social Connections: Not on file  Intimate Partner Violence: Not on file    FAMILY HISTORY:  Family History  Problem Relation Age of Onset   Pneumonia Mother    Other Father        brain tumor   Diabetes Son    Lung cancer Neg Hx     CURRENT MEDICATIONS:  Current Outpatient Medications  Medication Sig Dispense Refill   albuterol (PROVENTIL) (2.5 MG/3ML) 0.083% nebulizer solution Take 2.5 mg by nebulization every 6 (six) hours as needed for wheezing or shortness of breath.      albuterol (VENTOLIN HFA) 108 (90 Base) MCG/ACT inhaler Inhale 2 puffs into the lungs every 6 (six) hours as  needed. 18 g 5   amLODipine (NORVASC) 5 MG tablet Take 5 mg by mouth daily.     atorvastatin (LIPITOR) 80 MG tablet Take 80 mg by mouth every other day. AT NIGHT     BREZTRI AEROSPHERE 160-9-4.8 MCG/ACT AERO INHALE 2 PUFFS INTO THE LUNGS IN THE MORNING AND AT BEDTIME. 10.7 g 1   carvedilol (COREG) 3.125 MG tablet Take 1 tablet (3.125 mg total) by mouth 2 (two) times daily. 60 tablet 6   Cholecalciferol (VITAMIN D3) 5000 units CAPS Take 10,000 Units by mouth daily after breakfast.      lisinopril (PRINIVIL,ZESTRIL) 40 MG tablet Take 1 tablet (40 mg total) by mouth daily. 30 tablet  6   No current facility-administered medications for this visit.    ALLERGIES:  Allergies  Allergen Reactions   Hydrochlorothiazide Other (See Comments)    Pt states "it made my legs hurt"   Pravastatin Other (See Comments)    Muscle pain    PHYSICAL EXAM:  Performance status (ECOG): 1 - Symptomatic but completely ambulatory  Vitals:   09/04/21 1137  BP: 140/86  Pulse: 64  Resp: 18  Temp: 97.7 F (36.5 C)  SpO2: 98%   Wt Readings from Last 3 Encounters:  09/04/21 206 lb 5.6 oz (93.6 kg)  05/13/21 203 lb (92.1 kg)  05/02/21 204 lb 9.6 oz (92.8 kg)   Physical Exam Vitals reviewed.  Constitutional:      Appearance: Normal appearance.  Cardiovascular:     Rate and Rhythm: Normal rate and regular rhythm.     Pulses: Normal pulses.     Heart sounds: Normal heart sounds.  Pulmonary:     Effort: Pulmonary effort is normal.     Breath sounds: Normal breath sounds.  Neurological:     General: No focal deficit present.     Mental Status: He is alert and oriented to person, place, and time.  Psychiatric:        Mood and Affect: Mood normal.        Behavior: Behavior normal.     LABORATORY DATA:  I have reviewed the labs as listed.  CBC Latest Ref Rng & Units 08/28/2021 04/22/2021 12/18/2020  WBC 4.0 - 10.5 K/uL 8.3 7.3 6.5  Hemoglobin 13.0 - 17.0 g/dL 15.8 15.2 16.5  Hematocrit 39.0 - 52.0 %  44.9 43.2 47.4  Platelets 150 - 400 K/uL 177 164 177   CMP Latest Ref Rng & Units 08/28/2021 04/22/2021 12/18/2020  Glucose 70 - 99 mg/dL 90 89 93  BUN 8 - 23 mg/dL _0 Creatinine 0.61 - 1.24 mg/dL 1.43(H) 1.59(H) 1.41(H)  Sodium 135 - 145 mmol/L 136 135 133(L)  Potassium 3.5 - 5.1 mmol/L 4.0 3.8 4.3  Chloride 98 - 111 mmol/L 101 103 101  CO2 22 - 32 mmol/L _1 Calcium 8.9 - 10.3 mg/dL 9.1 8.8(L) 8.8(L)  Total Protein 6.5 - 8.1 g/dL 7.7 7.3 7.5  Total Bilirubin 0.3 - 1.2 mg/dL 0.7 0.9 1.5(H)  Alkaline Phos 38 - 126 U/L 87 73 123  AST 15 - 41 U/L _2 ALT 0 - 44 U/L _3 DIAGNOSTIC IMAGING:  I have independently reviewed the scans and discussed with the patient. CT Chest W Contrast  Result Date: 09/03/2021 CLINICAL DATA:  w a 70 year old male presents for lung cancer surveillance. EXAM: CT CHEST WITH CONTRAST TECHNIQUE: Multidetector CT imaging of the chest was performed during intravenous contrast administration. CONTRAST:  59m OMNIPAQUE IOHEXOL 300 MG/ML  SOLN COMPARISON:  Comparison is made with April 22, 2021. FINDINGS: Cardiovascular: 4 cm ascending thoracic aortic dilation. Calcified and noncalcified atheromatous plaque of the thoracic aorta. Findings are unchanged. Central pulmonary vasculature is dilated to 3.5 cm, this finding is also stable. Heart size is stable and normal. No pericardial effusion. Three-vessel coronary artery disease as before. Mediastinum/Nodes: No thoracic inlet, axillary, mediastinal or hilar adenopathy. Esophagus grossly normal. Lungs/Pleura: Pleural and parenchymal scarring associated with RIGHT upper lobectomy similar to previous imaging. Airways are patent. No consolidation or effusion. No suspicious pulmonary nodule. Stable small LEFT lower lobe pulmonary nodule (image 103/4) 3 mm. Small RIGHT lower lobe nodule (image 72/4)  5 mm, unchanged. Upper Abdomen: Incidental imaging of upper abdominal contents with bilateral adrenal thickening  and nodularity, 16 mm on the RIGHT and 13 x 25 mm on the LEFT without change. These are compatible with small adenomas and are unchanged since 10-20-16. No upper abdominal lymphadenopathy. No acute upper abdominal findings. Musculoskeletal: No acute bone finding. No destructive bone process. Spinal degenerative changes. IMPRESSION: Postoperative changes of RIGHT upper lobectomy without signs of disease recurrence. Stable lower lobe nodules largest on the RIGHT at 5 mm. 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 Recommend annual imaging followup by CTA or MRA. This recommendation follows 2008-10-20 ACCF/AHA/AATS/ACR/ASA/SCA/SCAI/SIR/STS/SVM Guidelines for the Diagnosis and Management of Patients with Thoracic Aortic Disease. Circulation. 10-20-08; 121: [id number]. Aortic aneurysm NOS (ICD10-I71.9) Aortic Atherosclerosis (ICD10-I70.0). Electronically Signed   By: Zetta Bills M.D.   On: 09/03/2021 15:24     ASSESSMENT:  1.  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  Anteromedial right upper lobe distal airway enlargement appears FDG avid with SUV of 13.6 concerning for endobronchial spread of tumor.  No signs of FDG avid mediastinal or hilar adenopathy or distant metastatic disease.  Multiple tiny peripheral nodules identified within the right upper lobe.  Bilateral adrenal gland adenomas. -Bronchoscopy and biopsy on 07/02/2020 showed poorly differentiated squamous cell carcinoma. -Right upper lobectomy and lymph node dissection by Dr. Roxan Hockey on 07/22/2020. -Pathology showed 5.1 cm moderately differentiated squamous cell carcinoma involving right upper lobe bronchus, visceral pleura not involved.  Bronchovascular resection margins are negative.  Negative lymphatic or vascular invasion.  Lymph nodes at level 7, levels 9, 11, 12 are negative for  carcinoma.  pT3 PN0.0/16 lymph nodes involved. - PD-L1 0% - Adjuvant chemotherapy was recommended but declined by the patient.   2.  Social/family history: -He lives at home by himself and is independent of all ADLs and IADLs. -He is accompanied by his daughter who works as a Marine scientist in cardiovascular surgery department. -He worked in [place] and quit smoking in Oct 20, 2012. -Brother died of metastatic cancer.  Maternal cousin had "bone cancer".   PLAN:  1.  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 - His breathing is continuing to improve with Breztri twice daily. - We reviewed CT chest with contrast from 09/03/2021 which showed postsurgical changes with no evidence of recurrence.  Stable lower lobe nodules largest on the right at 5 mm. - Recommend follow-up in 6 months with repeat CT scan of the chest and labs.   Orders placed this encounter:  No orders of the defined types were placed in this encounter.    Derek Jack, MD [place] [phone]   I, Thana Ates, am acting as a scribe for Dr. Derek Jack.  I, Derek Jack MD, have reviewed the above documentation for accuracy and completeness, and I agree with the above.

## 2021-09-04 ENCOUNTER — Inpatient Hospital Stay (HOSPITAL_COMMUNITY): Payer: PPO | Admitting: Hematology

## 2021-09-04 VITALS — BP 140/86 | HR 64 | Temp 97.7°F | Resp 18 | Ht 69.0 in | Wt 206.4 lb

## 2021-09-04 DIAGNOSIS — Z85118 Personal history of other malignant neoplasm of bronchus and lung: Secondary | ICD-10-CM | POA: Diagnosis not present

## 2021-09-04 DIAGNOSIS — C3491 Malignant neoplasm of unspecified part of right bronchus or lung: Secondary | ICD-10-CM

## 2021-09-04 NOTE — Patient Instructions (Signed)
[place] at [facility] Discharge Instructions   You were seen and examined today by Dr. Delton Coombes. He reviewed your CT scan results, which were very good. Return as scheduled in 6 months.    Thank you for choosing [facility] at [facility] to provide your oncology and hematology care.  To afford each patient quality time with our provider, please arrive at least 15 minutes before your scheduled appointment time.   If you have a lab appointment with the [facility] please come in thru the Main Entrance and check in at the main information desk.  You need to re-schedule your appointment should you arrive 10 or more minutes late.  We strive to give you quality time with our providers, and arriving late affects you and other patients whose appointments are after yours.  Also, if you no show three or more times for appointments you may be dismissed from the clinic at the providers discretion.     Again, thank you for choosing [facility].  Our hope is that these requests will decrease the amount of time that you wait before being seen by our physicians.       _____________________________________________________________  Should you have questions after your visit to [facility], please contact our office at [phone] and follow the prompts.  Our office hours are 8:00 a.m. and 4:30 p.m. Monday - Friday.  Please note that voicemails left after 4:00 p.m. may not be returned until the following business day.  We are closed weekends and major holidays.  You do have access to a nurse 24-7, just call the main number to the clinic [phone] and do not press any options, hold on the line and a nurse will answer the phone.    For prescription refill requests, have your pharmacy contact our office and allow 72 hours.    Due to Covid, you will need to wear a mask upon entering the hospital. If you do not have a mask, a mask  will be given to you at the Main Entrance upon arrival. For doctor visits, patients may have 1 support person age 45 or older with them. For treatment visits, patients can not have anyone with them due to social distancing guidelines and our immunocompromised population.

## 2021-11-06 ENCOUNTER — Encounter: Payer: Self-pay | Admitting: *Deleted

## 2021-11-06 ENCOUNTER — Encounter: Payer: Self-pay | Admitting: Cardiology

## 2021-11-06 ENCOUNTER — Ambulatory Visit: Payer: PPO | Admitting: Cardiology

## 2021-11-06 VITALS — BP 154/90 | HR 56 | Ht 69.0 in | Wt 210.0 lb

## 2021-11-06 DIAGNOSIS — I5022 Chronic systolic (congestive) heart failure: Secondary | ICD-10-CM

## 2021-11-06 DIAGNOSIS — I2510 Atherosclerotic heart disease of native coronary artery without angina pectoris: Secondary | ICD-10-CM | POA: Diagnosis not present

## 2021-11-06 DIAGNOSIS — E782 Mixed hyperlipidemia: Secondary | ICD-10-CM

## 2021-11-06 DIAGNOSIS — I10 Essential (primary) hypertension: Secondary | ICD-10-CM | POA: Diagnosis not present

## 2021-11-06 MED ORDER — ASPIRIN EC 81 MG PO TBEC: 81.0000 mg | DELAYED_RELEASE_TABLET | Freq: Every day | ORAL | 3 refills | Status: DC

## 2021-11-06 NOTE — Patient Instructions (Addendum)
Medication Instructions:  ?Your physician has recommended you make the following change in your medication:  ?Start aspirin 81 mg daily ?Continue other medications the same ? ?Labwork: ?none ? ?Testing/Procedures: ?none ? ?Follow-Up: ?Your physician recommends that you schedule a follow-up appointment in: 1 year. You will receive a reminder call in the mail in about 10 months reminding you to call and schedule your appointment. If you don't receive this call, please contact our office. ? ?Any Other Special Instructions Will Be Listed Below (If Applicable). ? ?If you need a refill on your cardiac medications before your next appointment, please call your pharmacy. ?

## 2021-11-06 NOTE — Progress Notes (Signed)
Clinical Summary Mr. Nicolini is a 71 y.o.maleseen today for follow up of the following medical problems.    1.Chronic Systolic Heart failure   - echo showed LVEF 40-45%, mod LVH, abnormal but ungraded diastolic function, reported akinesis of the basal anterolateral, mid-anterolateral, and apical lateral wall segments.   - lexiscan MPI  showed moderate sized interoapical and inferolateral infarct with mild peri-infarct ischemia.  - repeat echo 04/2017 LVEF 34-19%, grade I diastolic dysfunction     - chronic SOB improved with inhalers - compliant with meds.no recent edema         2 Presumed CAD - based on echo and MPI results, MPI with suggestion of prior infarcts with only mild peri-infarct ischemia. This has been medically managed.    - 03/2017 Lexiscan with inferior/inferoapical defect with mild to moderate peri-infarct ischemia.      - no recent chest pain - has been taking ASA     3. AAA  - followed by vascular - s/p open repair 06/2017   - vascular wanted a repeat CT in 08/2022     4. CKD III - followed by nephrology, baseline 1.4-1.6 - he reports last labs by pcp Cr down to 1.3     5. Hyperlipidemia - tolerating lipirtor 32m every other day.  - he reports recent labs with pcp    6. HTN - compliant with meds - 08/2021 oncology visit 140/86 - home bp's daily  120s/70s  7. Ascending aortic aneurysm - followed by Dr HRoxan Hockey12/2022 CT 4 cm ascending aortic aneurysm  8. History of lung cancer - Right upper lobectomy and lymph node dissection on 07/22/2020   SH: retired from [employer] years ago. Grandkids x2 one is 14 and one is 5, he has 2 children. Daughter works as nMarine scientistat [employer]  Past Medical History:  Diagnosis Date   AAA (abdominal aortic aneurysm) ([facility]    CHF (congestive heart failure) (HCC)    Chronic kidney disease (CKD), stage III (moderate) (HCC)    Complication of anesthesia    Bowel and bladder are slow to wake up after  surgery   COPD (chronic obstructive pulmonary disease) ([facility]    Coronary artery disease    presumed   Dyspnea    exertion   High cholesterol    Lung cancer (HCC)    Unspecified essential hypertension      Allergies  Allergen Reactions   Hydrochlorothiazide Other (See Comments)    Pt states "it made my legs hurt"   Pravastatin Other (See Comments)    Muscle pain     Current Outpatient Medications  Medication Sig Dispense Refill   albuterol (PROVENTIL) (2.5 MG/3ML) 0.083% nebulizer solution Take 2.5 mg by nebulization every 6 (six) hours as needed for wheezing or shortness of breath.      albuterol (VENTOLIN HFA) 108 (90 Base) MCG/ACT inhaler Inhale 2 puffs into the lungs every 6 (six) hours as needed. 18 g 5   amLODipine (NORVASC) 5 MG tablet Take 5 mg by mouth daily.     atorvastatin (LIPITOR) 80 MG tablet Take 80 mg by mouth every other day. AT NIGHT     BREZTRI AEROSPHERE 160-9-4.8 MCG/ACT AERO INHALE 2 PUFFS INTO THE LUNGS IN THE MORNING AND AT BEDTIME. 10.7 g 1   carvedilol (COREG) 3.125 MG tablet Take 1 tablet (3.125 mg total) by mouth 2 (two) times daily. 60 tablet 6   Cholecalciferol (VITAMIN D3) 5000 units CAPS Take 10,000 Units  by mouth daily after breakfast.      lisinopril (PRINIVIL,ZESTRIL) 40 MG tablet Take 1 tablet (40 mg total) by mouth daily. 30 tablet 6   No current facility-administered medications for this visit.     Past Surgical History:  Procedure Laterality Date   ABDOMINAL AORTIC ANEURYSM REPAIR N/A 06/28/2017   Procedure: ANEURYSM ABDOMINAL AORTIC REPAIR OPEN;  Surgeon: Elam Dutch, MD;  Location: [place];  Service: Vascular;  Laterality: N/A;   BRONCHIAL BRUSHINGS  07/02/2020   Procedure: BRONCHIAL BRUSHINGS;  Surgeon: Spero Geralds, MD;  [facility];  Service: Pulmonary;;   BRONCHIAL WASHINGS  07/02/2020   Procedure: BRONCHIAL WASHINGS;  Surgeon: Spero Geralds, MD;  [facility];  Service: Pulmonary;;  BAL and washings    HAND SURGERY Left    HERNIA REPAIR     INCISIONAL HERNIA REPAIR     LEFT   INTERCOSTAL NERVE BLOCK Right 07/22/2020   Procedure: INTERCOSTAL NERVE BLOCK;  Surgeon: Melrose Nakayama, MD;  Location: [place];  Service: Thoracic;  Laterality: Right;   IRRIGATION AND DEBRIDEMENT SEBACEOUS CYST     FROM RIGHT SHOULDER   LUNG BIOPSY  07/02/2020   Procedure: LUNG BIOPSY;  Surgeon: Spero Geralds, MD;  Location: [facility];  Service: Pulmonary;;   NODE DISSECTION Right 07/22/2020   Procedure: NODE DISSECTION;  Surgeon: Melrose Nakayama, MD;  Location: [place];  Service: Thoracic;  Laterality: Right;   UMBILICAL HERNIA REPAIR     VIDEO BRONCHOSCOPY Right 07/02/2020   Procedure: VIDEO BRONCHOSCOPY WITHOUT FLUORO;  Surgeon: Spero Geralds, MD;  [facility];  Service: Pulmonary;  Laterality: Right;     Allergies  Allergen Reactions   Hydrochlorothiazide Other (See Comments)    Pt states "it made my legs hurt"   Pravastatin Other (See Comments)    Muscle pain      Family History  Problem Relation Age of Onset   Pneumonia Mother    Other Father        brain tumor   Diabetes Son    Lung cancer Neg Hx      Social History Mr. Damaso reports that he quit smoking about 8 years ago. His smoking use included cigarettes. He started smoking about 56 years ago. He has a 69.00 pack-year smoking history. He has never used smokeless tobacco. Mr. Opiela reports no history of alcohol use.   Review of Systems CONSTITUTIONAL: No weight loss, fever, chills, weakness or fatigue.  HEENT: Eyes: No visual loss, blurred vision, double vision or yellow sclerae.No hearing loss, sneezing, congestion, runny nose or sore throat.  SKIN: No rash or itching.  CARDIOVASCULAR: per hpi RESPIRATORY: No shortness of breath, cough or sputum.  GASTROINTESTINAL: No anorexia, nausea, vomiting or diarrhea. No abdominal pain or blood.  GENITOURINARY: No burning on urination, no  polyuria NEUROLOGICAL: No headache, dizziness, syncope, paralysis, ataxia, numbness or tingling in the extremities. No change in bowel or bladder control.  MUSCULOSKELETAL: No muscle, back pain, joint pain or stiffness.  LYMPHATICS: No enlarged nodes. No history of splenectomy.  PSYCHIATRIC: No history of depression or anxiety.  ENDOCRINOLOGIC: No reports of sweating, cold or heat intolerance. No polyuria or polydipsia.  Marland Kitchen   Physical Examination Today's Vitals   11/06/21 1325  BP: (!) 154/90  Pulse: (!) 56  SpO2: 97%  Weight: 210 lb (95.3 kg)  Height: _0  (1.753 m)   Body mass index is 31.01 kg/m.  Gen: resting comfortably, no acute distress HEENT:  no scleral icterus, pupils equal round and reactive, no palptable cervical adenopathy,  CV: RRR, no m/r,g no jvd Resp: Clear to auscultation bilaterally GI: abdomen is soft, non-tender, non-distended, normal bowel sounds, no hepatosplenomegaly MSK: extremities are warm, no edema.  Skin: warm, no rash Neuro:  no focal deficits Psych: appropriate affect   Diagnostic Studies  02/2014 MPI   Raw images showed appropriate radiotracer uptake. There is a   moderate-sized mildly reversible inferoapical and inferolateral wall   defect. There are no other myocardial perfusion defects. There is   mild inferolateral wall hypokinesis.   Gated imaging shows end-diastolic volume 416 mL, and systolic volume   90 mL, left ventricular ejection fraction 40%.   IMPRESSION:   1. Abnormal Lexiscan MPI   2. Moderate sized mildl intenstiry inferoapcail and inferolateral   wall infarcts with mild peri-infarct ischemia.   3. Decreased left ventricular systolic function, LVEF 60%   4. Increased study for major cardiac events due to decreased LV   systolic function, there is fairly mild myocardium at jeopardy    02/2014 Echo LVEF 40-45%, abnormal diastolic function. Multiple WMAs,   03/2017 MPI Nuclear stress EF: 61%. There was no ST segment  deviation noted during stress. Defect 1: There is a defect present in the basal inferior, basal inferolateral, mid inferior, mid inferolateral and apical inferior location. Findings consistent with ischemia. This is an intermediate risk study. The left ventricular ejection fraction is normal (55-65%).   There is a large area, moderate severity defect in the basal and mid inferolateral, inferior and apical inferior walls (SDS=6) consistent with ischemia in the LCX territory.    04/2017 echo Study Conclusions   - Left ventricle: The cavity size was normal. Wall thickness was   increased in a pattern of moderate LVH. Systolic function was   normal. The estimated ejection fraction was in the range of 60%   to 65%. Wall motion was normal; there were no regional wall   motion abnormalities. Doppler parameters are consistent with   abnormal left ventricular relaxation (grade 1 diastolic   dysfunction). - Aortic valve: Valve area (VTI): 3.52 cm^2. Valve area (Vmax):   3.11 cm^2. Valve area (Vmean): 3.12 cm^2. - Aorta: Aortic root dimension: 40 mm (ED). Ascending aortic   diameter: 40 mm (S). - Aortic root: The aortic root was mildly dilated. - Left atrium: The atrium was moderately dilated. - Technically adequate study.   Assessment and Plan  1. Chronic systolic heart failure   - by most recent echo LVEF has normalized.  - MPI shows evidence of possible old infarct, likely a component of ICM, though could be mixed with a HTN CM as well.     - no symptoms, continue currentmeds     2. Presumed CAD   - prior nuclear stress tests including a recent test have shown evidence of prior infarct with peri-infarct ischemia - this has been managed medically in the absence of symptoms combined with his CKD   - no recent symptoms - recommended to add 13m daily to his home regimen - EKG shows SR, no acute ischemic changes   3. Hyperlipidemia - continue every other day atorvastatin, did not  tolerate daily dosing.  - we will requst pcp labs       4. HTN -elevated here but home daily bp's are at goal, continue curernt meds  F/u 1year        JArnoldo Lenis M.D.

## 2021-11-15 ENCOUNTER — Other Ambulatory Visit: Payer: Self-pay | Admitting: Internal Medicine

## 2021-11-15 DIAGNOSIS — J449 Chronic obstructive pulmonary disease, unspecified: Secondary | ICD-10-CM

## 2022-03-04 ENCOUNTER — Other Ambulatory Visit: Payer: Self-pay | Admitting: Internal Medicine

## 2022-03-05 ENCOUNTER — Encounter (HOSPITAL_COMMUNITY): Payer: Self-pay

## 2022-03-05 ENCOUNTER — Ambulatory Visit (HOSPITAL_COMMUNITY)
Admission: RE | Admit: 2022-03-05 | Discharge: 2022-03-05 | Disposition: A | Discharge status: Home / Self Care | Payer: PPO | Source: Ambulatory Visit | Attending: Hematology | Admitting: Hematology

## 2022-03-05 ENCOUNTER — Inpatient Hospital Stay (HOSPITAL_COMMUNITY): Payer: PPO | Attending: Hematology

## 2022-03-05 DIAGNOSIS — C3411 Malignant neoplasm of upper lobe, right bronchus or lung: Secondary | ICD-10-CM | POA: Diagnosis present

## 2022-03-05 DIAGNOSIS — C3491 Malignant neoplasm of unspecified part of right bronchus or lung: Secondary | ICD-10-CM | POA: Insufficient documentation

## 2022-03-05 LAB — CBC WITH DIFFERENTIAL/PLATELET
Abs Immature Granulocytes: 0.01 10*3/uL (ref 0.00–0.07)
Basophils Absolute: 0.1 10*3/uL (ref 0.0–0.1)
Basophils Relative: 1 %
Eosinophils Absolute: 0.2 10*3/uL (ref 0.0–0.5)
Eosinophils Relative: 4 %
HCT: 41.5 % (ref 39.0–52.0)
Hemoglobin: 14.9 g/dL (ref 13.0–17.0)
Immature Granulocytes: 0 %
Lymphocytes Relative: 20 %
Lymphs Abs: 1.3 10*3/uL (ref 0.7–4.0)
MCH: 35.4 pg — ABNORMAL HIGH (ref 26.0–34.0)
MCHC: 35.9 g/dL (ref 30.0–36.0)
MCV: 98.6 fL (ref 80.0–100.0)
Monocytes Absolute: 0.5 10*3/uL (ref 0.1–1.0)
Monocytes Relative: 7 %
Neutro Abs: 4.4 10*3/uL (ref 1.7–7.7)
Neutrophils Relative %: 68 %
Platelets: 178 10*3/uL (ref 150–400)
RBC: 4.21 MIL/uL — ABNORMAL LOW (ref 4.22–5.81)
RDW: 12.6 % (ref 11.5–15.5)
WBC: 6.5 10*3/uL (ref 4.0–10.5)
nRBC: 0 % (ref 0.0–0.2)

## 2022-03-05 LAB — COMPREHENSIVE METABOLIC PANEL
ALT: 18 U/L (ref 0–44)
AST: 18 U/L (ref 15–41)
Albumin: 3.9 g/dL (ref 3.5–5.0)
Alkaline Phosphatase: 83 U/L (ref 38–126)
Anion gap: 5 (ref 5–15)
BUN: 18 mg/dL (ref 8–23)
CO2: 26 mmol/L (ref 22–32)
Calcium: 8.9 mg/dL (ref 8.9–10.3)
Chloride: 104 mmol/L (ref 98–111)
Creatinine, Ser: 1.34 mg/dL — ABNORMAL HIGH (ref 0.61–1.24)
GFR, Estimated: 57 mL/min — ABNORMAL LOW (ref >60)
Glucose, Bld: 116 mg/dL — ABNORMAL HIGH (ref 70–99)
Potassium: 4 mmol/L (ref 3.5–5.1)
Sodium: 135 mmol/L (ref 135–145)
Total Bilirubin: 1.3 mg/dL — ABNORMAL HIGH (ref 0.3–1.2)
Total Protein: 7.4 g/dL (ref 6.5–8.1)

## 2022-03-05 MED ORDER — IOHEXOL 300 MG/ML  SOLN
75.0000 mL | Freq: Once | INTRAMUSCULAR | Status: AC | PRN
  Administered 2022-03-05: 75 mL via INTRAVENOUS

## 2022-03-12 ENCOUNTER — Inpatient Hospital Stay (HOSPITAL_COMMUNITY): Payer: PPO | Attending: Hematology | Admitting: Hematology

## 2022-03-12 VITALS — BP 131/91 | HR 54 | Temp 98.0°F | Resp 16 | Ht 69.0 in | Wt 204.9 lb

## 2022-03-12 DIAGNOSIS — Z85118 Personal history of other malignant neoplasm of bronchus and lung: Secondary | ICD-10-CM | POA: Insufficient documentation

## 2022-03-12 DIAGNOSIS — N189 Chronic kidney disease, unspecified: Secondary | ICD-10-CM | POA: Diagnosis not present

## 2022-03-12 DIAGNOSIS — Z08 Encounter for follow-up examination after completed treatment for malignant neoplasm: Secondary | ICD-10-CM | POA: Insufficient documentation

## 2022-03-12 DIAGNOSIS — C3491 Malignant neoplasm of unspecified part of right bronchus or lung: Secondary | ICD-10-CM | POA: Diagnosis not present

## 2022-03-12 NOTE — Progress Notes (Signed)
Nicolas Butler, [place] [id number]   CLINIC:  Medical Oncology/Hematology  PCP:  Lavella Lemons, PA [address] 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  PRIOR THERAPY: Right upper lobectomy and lymph node dissection on 07/22/2020  NGS Results: Foundation 1 MS-stable, TMB 19 Muts/Mb, PD-L1 TPS 0%  CURRENT THERAPY: surveillance  BRIEF ONCOLOGIC HISTORY:  Oncology History  Malignant neoplasm of upper lobe of right lung ([place])  07/11/2020 Initial Diagnosis   Malignant neoplasm of upper lobe of right lung ([place])   07/25/2020 Cancer Staging   Staging form: Lung, AJCC 8th Edition - Pathologic stage from 07/25/2020: Stage IIB (pT3, pN0, cM0) - Signed by Melrose Nakayama, MD on 07/25/2020   09/20/2020 Genetic Testing   PD-L1 Results:     09/23/2020 -  Chemotherapy   The patient had PALONOSETRON HCL INJECTION 0.25 MG/5ML, 0.25 mg, Intravenous,  Once, 0 of 4 cycles pegfilgrastim-jmdb (FULPHILA) injection 6 mg, 6 mg, Subcutaneous,  Once, 0 of 4 cycles CARBOplatin (PARAPLATIN) in sodium chloride 0.9 % 100 mL chemo infusion, , Intravenous,  Once, 0 of 4 cycles FOSAPREPITANT IV INFUSION 150 MG, 150 mg, Intravenous,  Once, 0 of 4 cycles PACLitaxel (TAXOL) 414 mg in sodium chloride 0.9 % 500 mL chemo infusion (> 10m/m2), 200 mg/m2, Intravenous,  Once, 0 of 4 cycles  for chemotherapy treatment.    09/26/2020 Genetic Testing   Foundation One Results:       CANCER STAGING: Cancer Staging  Malignant neoplasm of upper lobe of right lung ([place] Staging form: Lung, AJCC 8th Edition - Clinical stage from 07/11/2020: Stage IA3 (cT1c, cN0, cM0) - Unsigned - Pathologic stage from 07/25/2020: Stage IIB (pT3, pN0, cM0) - Signed by HMelrose Nakayama MD on 07/25/2020   INTERVAL HISTORY:  Nicolas Butler a 71y.o. male, returns for routine follow-up of his right squamous cell  lung carcinoma. [name] last seen on 09/04/2021.   Today he reports feeling good. He denies any infections since his last visit.   REVIEW OF SYSTEMS:  Review of Systems  Constitutional:  Negative for appetite change and fatigue.  Respiratory:  Positive for cough (dry).   Genitourinary:  Positive for frequency.   All other systems reviewed and are negative.   PAST MEDICAL/SURGICAL HISTORY:  Past Medical History:  Diagnosis Date   AAA (abdominal aortic aneurysm)    CHF (congestive heart failure) (HCC)    Chronic kidney disease (CKD), stage III (moderate) (HCC)    Complication of anesthesia    Bowel and bladder are slow to wake up after surgery   COPD (chronic obstructive pulmonary disease) (HCC)    Coronary artery disease    presumed   Dyspnea    exertion   High cholesterol    Lung cancer ([facility]    Unspecified essential hypertension    Past Surgical History:  Procedure Laterality Date   ABDOMINAL AORTIC ANEURYSM REPAIR N/A 06/28/2017   Procedure: ANEURYSM ABDOMINAL AORTIC REPAIR OPEN;  Surgeon: FElam Dutch MD;  [place]  Service: Vascular;  Laterality: N/A;   BRONCHIAL BRUSHINGS  07/02/2020   Procedure: BRONCHIAL BRUSHINGS;  Surgeon: DSpero Geralds MD;  [facility];  Service: Pulmonary;;   BRONCHIAL WASHINGS  07/02/2020   Procedure: BRONCHIAL WASHINGS;  Surgeon: DSpero Geralds MD;  [facility];  Service: Pulmonary;;  BAL and washings   HAND SURGERY  Left    HERNIA REPAIR     INCISIONAL HERNIA REPAIR     LEFT   INTERCOSTAL NERVE BLOCK Right 07/22/2020   Procedure: INTERCOSTAL NERVE BLOCK;  Surgeon: Melrose Nakayama, MD;  Location: [facility];  Service: Thoracic;  Laterality: Right;   IRRIGATION AND DEBRIDEMENT SEBACEOUS CYST     FROM RIGHT SHOULDER   LUNG BIOPSY  07/02/2020   Procedure: LUNG BIOPSY;  Surgeon: Spero Geralds, MD;  Location[facility];  Service: Pulmonary;;   NODE DISSECTION Right 07/22/2020   Procedure: NODE  DISSECTION;  Surgeon: Melrose Nakayama, MD;  Location: [facility];  Service: Thoracic;  Laterality: Right;   UMBILICAL HERNIA REPAIR     VIDEO BRONCHOSCOPY Right 07/02/2020   Procedure: VIDEO BRONCHOSCOPY WITHOUT FLUORO;  Surgeon: Spero Geralds, MD;  [facility];  Service: Pulmonary;  Laterality: Right;    SOCIAL HISTORY:  Social History   Socioeconomic History   Marital status: Legally Separated    Spouse name: Not on file   Number of children: 2   Years of education: Not on file   Highest education level: Not on file  Occupational History   Occupation: retired  Tobacco Use   Smoking status: Former    Packs/day: 1.50    Years: 46.00    Total pack years: 69.00    Types: Cigarettes    Start date: 05/17/1965    Quit date: 12/08/2012    Years since quitting: 9.2   Smokeless tobacco: Never  Vaping Use   Vaping Use: Never used  Substance and Sexual Activity   Alcohol use: No    Alcohol/week: 0.0 standard drinks of alcohol   Drug use: No   Sexual activity: Not Currently  Other Topics Concern   Not on file  Social History Narrative   Not on file   Social Determinants of Health   Financial Resource Strain: Low Risk  (09/02/2020)   Overall Financial Resource Strain (CARDIA)    Difficulty of Paying Living Expenses: Not hard at all  Food Insecurity: No Food Insecurity (09/02/2020)   Hunger Vital Sign    Worried About Running Out of Food in the Last Year: Never true    [place] in the Last Year: Never true  Transportation Needs: No Transportation Needs (09/02/2020)   PRAPARE - Hydrologist (Medical): No    Lack of Transportation (Non-Medical): No  Physical Activity: Insufficiently Active (09/02/2020)   Exercise Vital Sign    Days of Exercise per Week: 1 day    Minutes of Exercise per Session: 20 min  Stress: No Stress Concern Present (09/02/2020)   [place]     Feeling of Stress : Not at all  Social Connections: Moderately Isolated (09/02/2020)   Social Connection and Isolation Panel [NHANES]    Frequency of Communication with Friends and Family: Three times a week    Frequency of Social Gatherings with Friends and Family: Twice a week    Attends Religious Services: More than 4 times per year    Active Member of [employer] or Organizations: No    Attends Archivist Meetings: Never    Marital Status: Separated  Intimate Partner Violence: Not At Risk (09/02/2020)   Humiliation, Afraid, Rape, and Kick questionnaire    Fear of Current or Ex-Partner: No    Emotionally Abused: No    Physically Abused: No    Sexually Abused: No  FAMILY HISTORY:  Family History  Problem Relation Age of Onset   Pneumonia Mother    Other Father        brain tumor   Diabetes Son    Lung cancer Neg Hx     CURRENT MEDICATIONS:  Current Outpatient Medications  Medication Sig Dispense Refill   Budeson-Glycopyrrol-Formoterol (BREZTRI AEROSPHERE) 160-9-4.8 MCG/ACT AERO INHALE 2 PUFFS INTO THE LUNGS IN THE MORNING AND AT BEDTIME. 10.7 g 1   albuterol (PROVENTIL) (2.5 MG/3ML) 0.083% nebulizer solution Take 2.5 mg by nebulization every 6 (six) hours as needed for wheezing or shortness of breath.      albuterol (VENTOLIN HFA) 108 (90 Base) MCG/ACT inhaler INHALE 2 PUFFS INTO THE LUNGS EVERY 6 HOURS AS NEEDED 18 each 5   amLODipine (NORVASC) 5 MG tablet Take 5 mg by mouth daily.     aspirin EC 81 MG tablet Take 1 tablet (81 mg total) by mouth daily. Swallow whole. 90 tablet 3   atorvastatin (LIPITOR) 80 MG tablet Take 80 mg by mouth every other day. AT NIGHT     carvedilol (COREG) 3.125 MG tablet Take 1 tablet (3.125 mg total) by mouth 2 (two) times daily. 60 tablet 6   Cholecalciferol (VITAMIN D3) 5000 units CAPS Take 10,000 Units by mouth daily after breakfast.      lisinopril (PRINIVIL,ZESTRIL) 40 MG tablet Take 1 tablet (40 mg total) by mouth daily. 30 tablet 6    No current facility-administered medications for this visit.    ALLERGIES:  Allergies  Allergen Reactions   Hydrochlorothiazide Other (See Comments)    Pt states "it made my legs hurt"   Pravastatin Other (See Comments)    Muscle pain    PHYSICAL EXAM:  Performance status (ECOG): 1 - Symptomatic but completely ambulatory  There were no vitals filed for this visit. Wt Readings from Last 3 Encounters:  11/06/21 210 lb (95.3 kg)  09/04/21 206 lb 5.6 oz (93.6 kg)  05/13/21 203 lb (92.1 kg)   Physical Exam Vitals reviewed.  Constitutional:      Appearance: Normal appearance.  Cardiovascular:     Rate and Rhythm: Normal rate and regular rhythm.     Pulses: Normal pulses.     Heart sounds: Normal heart sounds.  Pulmonary:     Effort: Pulmonary effort is normal.     Breath sounds: Normal breath sounds.  Neurological:     General: No focal deficit present.     Mental Status: He is alert and oriented to person, place, and time.  Psychiatric:        Mood and Affect: Mood normal.        Behavior: Behavior normal.      LABORATORY DATA:  I have reviewed the labs as listed.     Latest Ref Rng & Units 03/05/2022    8:01 AM 08/28/2021   11:48 AM 04/22/2021    9:48 AM  CBC  WBC 4.0 - 10.5 K/uL 6.5  8.3  7.3   Hemoglobin 13.0 - 17.0 g/dL 14.9  15.8  15.2   Hematocrit 39.0 - 52.0 % 41.5  44.9  43.2   Platelets 150 - 400 K/uL 178  177  164       Latest Ref Rng & Units 03/05/2022    8:01 AM 08/28/2021   11:48 AM 04/22/2021    9:48 AM  CMP  Glucose 70 - 99 mg/dL 116  90  89   BUN 8 - 23 mg/dL 18  17  19   Creatinine 0.61 - 1.24 mg/dL 1.34  1.43  1.59   Sodium 135 - 145 mmol/L 135  136  135   Potassium 3.5 - 5.1 mmol/L 4.0  4.0  3.8   Chloride 98 - 111 mmol/L 104  101  103   CO2 22 - 32 mmol/L _0 Calcium 8.9 - 10.3 mg/dL 8.9  9.1  8.8   Total Protein 6.5 - 8.1 g/dL 7.4  7.7  7.3   Total Bilirubin 0.3 - 1.2 mg/dL 1.3  0.7  0.9   Alkaline Phos 38 - 126 U/L 83  87   73   AST 15 - 41 U/L _1 ALT 0 - 44 U/L _2 DIAGNOSTIC IMAGING:  I have independently reviewed the scans and discussed with the patient. CT Chest W Contrast  Result Date: 03/06/2022 CLINICAL DATA:  Non-small cell lung cancer. EXAM: CT CHEST WITH CONTRAST TECHNIQUE: Multidetector CT imaging of the chest was performed during intravenous contrast administration. RADIATION DOSE REDUCTION: This exam was performed according to the departmental dose-optimization program which includes automated exposure control, adjustment of the mA and/or kV according to patient size and/or use of iterative reconstruction technique. CONTRAST:  38m OMNIPAQUE IOHEXOL 300 MG/ML  SOLN COMPARISON:  09/03/2021 FINDINGS: Cardiovascular: Coronary, aortic arch, and branch vessel atherosclerotic vascular disease. Ascending aortic aneurysm 4.2 cm in diameter on image 73 series 2, stable. Mediastinum/Nodes: Unremarkable Lungs/Pleura: Two nodules, each about 5 by 6 mm, are observed on images 71-76 of series 4. By my measurements these are stable from 09/03/2021. Right upper lobectomy. Volume loss/scarring in the right middle lobe. Mucous plugging in portions of the right lower lobe. Stable 3 mm subpleural node observed in the left lower lobe on image 105 series 4. Upper Abdomen: Stable bilateral adrenal masses previously characterized as adenomas based on density on noncontrast examination. Musculoskeletal: Old nonunited right lower rib fractures laterally. Healing fractures of the left anterior seventh and eighth ribs, new compared to 09/03/2021. IMPRESSION: 1. Stable small right lower lobe lung nodules at 5 by 6 mm. Small amount of mucus plugging in the right lower lobe. 2. Prior right upper lobectomy.  No new or progressive findings. 3. Ascending aortic aneurysm 4.2 cm in diameter. This can potentially be observed in the context of the patient's follow up oncology imaging. Otherwise, recommend annual imaging followup  by CTA or MRA. This recommendation follows 2010 ACCF/AHA/AATS/ACR/ASA/SCA/SCAI/SIR/STS/SVM Guidelines for the Diagnosis and Management of Patients with Thoracic Aortic Disease. Circulation. 2010; 121:: B762-G315 Aortic aneurysm NOS (ICD10-I71.9) 4.  Aortic Atherosclerosis (ICD10-I70.0).  Coronary atherosclerosis. 5. Healing fractures the left anterior seventh and eighth ribs, new compared to 09/03/2021. 6. Bilateral adrenal adenomas. Electronically Signed   By: WVan ClinesM.D.   On: 03/06/2022 08:23     ASSESSMENT:  1.  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  Anteromedial right upper lobe distal airway enlargement appears FDG avid with SUV of 13.6 concerning for endobronchial spread of tumor.  No signs of FDG avid mediastinal or hilar adenopathy or distant metastatic disease.  Multiple tiny peripheral nodules identified within the right upper lobe.  Bilateral adrenal gland adenomas. -Bronchoscopy and biopsy on 07/02/2020 showed poorly differentiated squamous cell carcinoma. -Right upper lobectomy and lymph node  dissection by Dr. Roxan Hockey on 07/22/2020. -Pathology showed 5.1 cm moderately differentiated squamous cell carcinoma involving right upper lobe bronchus, visceral pleura not involved.  Bronchovascular resection margins are negative.  Negative lymphatic or vascular invasion.  Lymph nodes at level 7, levels 9, 11, 12 are negative for carcinoma.  pT3 PN0.0/16 lymph nodes involved. - PD-L1 0% - Adjuvant chemotherapy was recommended but declined by the patient.   2.  Social/family history: -He lives at home by himself and is independent of all ADLs and IADLs. -He is accompanied by his daughter who works as a Marine scientist in cardiovascular surgery department. -He worked in [place] and quit smoking in 11-08-2012. -Brother died of metastatic  cancer.  Maternal cousin had "bone cancer".     PLAN:  1.  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 - Reviewed CT chest with contrast (03/05/2022): Stable small right lower lobe nodules.  No evidence of recurrence or metastatic disease.  Other benign findings were discussed with the patient including bilateral adrenal adenomas. - Reviewed labs today which were within normal limits.  CKD is stable with creatinine 1.34. - Recommend RTC in 6 months.  We will also check serum cortisol and ACTH at that time.   Orders placed this encounter:  No orders of the defined types were placed in this encounter.    Derek Jack, MD [place] [phone]   I, Thana Ates, am acting as a scribe for Dr. Derek Jack.  I, Derek Jack MD, have reviewed the above documentation for accuracy and completeness, and I agree with the above.

## 2022-03-12 NOTE — Patient Instructions (Signed)
[place] at [facility] Discharge Instructions  You were seen and examined today by Dr. Delton Coombes.  Dr. Delton Coombes discussed your most recent lab work and CT scan which revealed no signs of cancer recurrence. The nodules that have been noted previously remain stable.  Your kidney function is elevated, please drink plenty of fluids.  Follow-up as scheduled in 6 months with a CT scan prior.  Thank you for choosing [place] at [facility] to provide your oncology and hematology care.  To afford each patient quality time with our provider, please arrive at least 15 minutes before your scheduled appointment time.   If you have a lab appointment with the [facility] please come in thru the Main Entrance and check in at the main information desk.  You need to re-schedule your appointment should you arrive 10 or more minutes late.  We strive to give you quality time with our providers, and arriving late affects you and other patients whose appointments are after yours.  Also, if you no show three or more times for appointments you may be dismissed from the clinic at the providers discretion.     Again, thank you for choosing [facility].  Our hope is that these requests will decrease the amount of time that you wait before being seen by our physicians.       _____________________________________________________________  Should you have questions after your visit to [facility], please contact our office at [phone] and follow the prompts.  Our office hours are 8:00 a.m. and 4:30 p.m. Monday - Friday.  Please note that voicemails left after 4:00 p.m. may not be returned until the following business day.  We are closed weekends and major holidays.  You do have access to a nurse 24-7, just call the main number to the clinic [phone] and do not press any options, hold on the line and a nurse will answer the phone.     For prescription refill requests, have your pharmacy contact our office and allow 72 hours.

## 2022-03-27 ENCOUNTER — Other Ambulatory Visit: Payer: Self-pay | Admitting: Internal Medicine

## 2022-03-27 DIAGNOSIS — J449 Chronic obstructive pulmonary disease, unspecified: Secondary | ICD-10-CM

## 2022-03-30 ENCOUNTER — Other Ambulatory Visit: Payer: Self-pay

## 2022-04-07 ENCOUNTER — Other Ambulatory Visit: Payer: Self-pay

## 2022-04-16 ENCOUNTER — Other Ambulatory Visit: Payer: Self-pay

## 2022-04-17 IMAGING — CT CT CHEST W/ CM
2 of 3 series · 15 of 36 positions shown, 18 images · IV contrast (omnipaque)
Comparison: Comparison is made with April 22, 2021.

CLINICAL DATA: w a 70-year-old male presents for lung cancer
surveillance.

EXAM:
CT CHEST WITH CONTRAST
TECHNIQUE: Multidetector CT imaging of the chest was performed during
intravenous contrast administration.
CONTRAST:  75mL OMNIPAQUE IOHEXOL 300 MG/ML  SOLN

[Series 2: routine chest with · axial · 0.83mm/px · z∈[+1286,+1578]mm · 12 of 172 slices shown, 15 images]
[im 13/172  mediastinal]
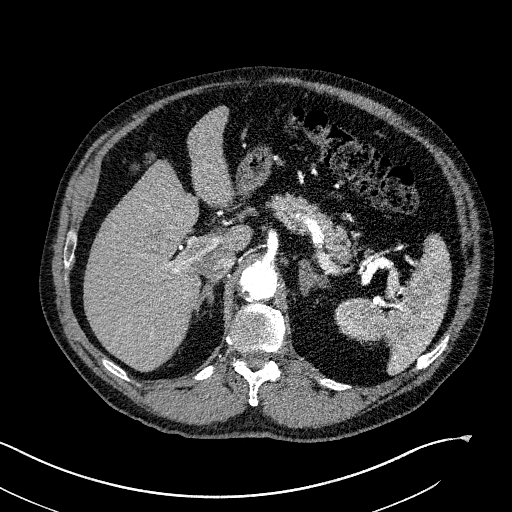
[im 13/172  lung]
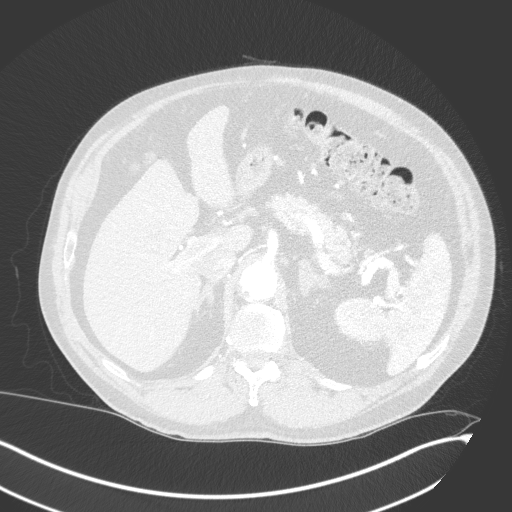
[im 26/172  lung]
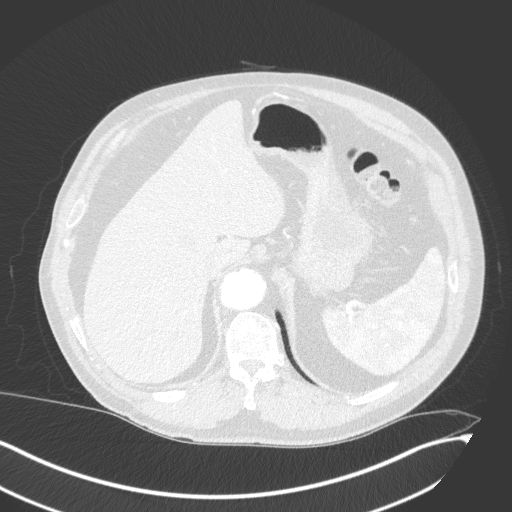
[im 39/172  lung]
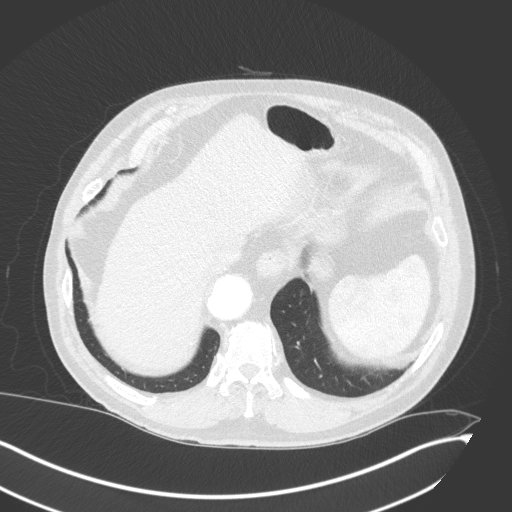
[im 51/172  lung]
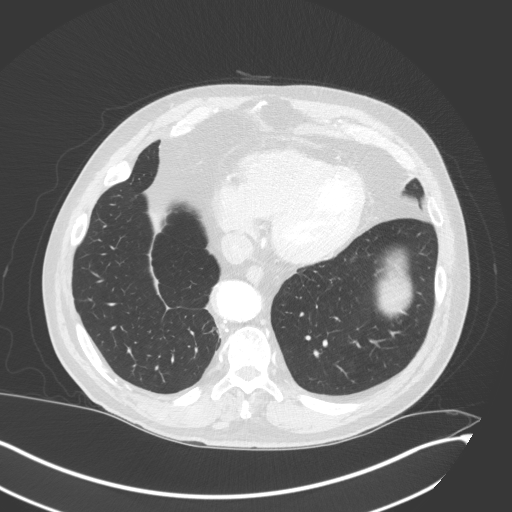
[im 64/172  mediastinal]
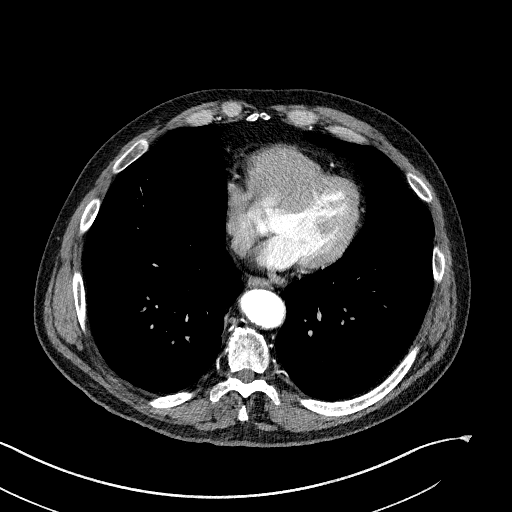
[im 64/172  lung]
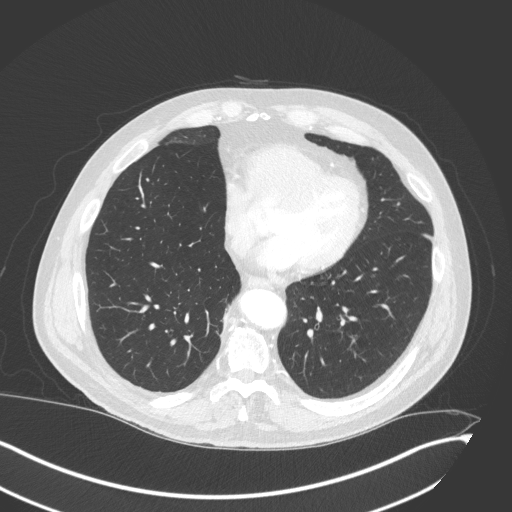
[im 77/172  lung]
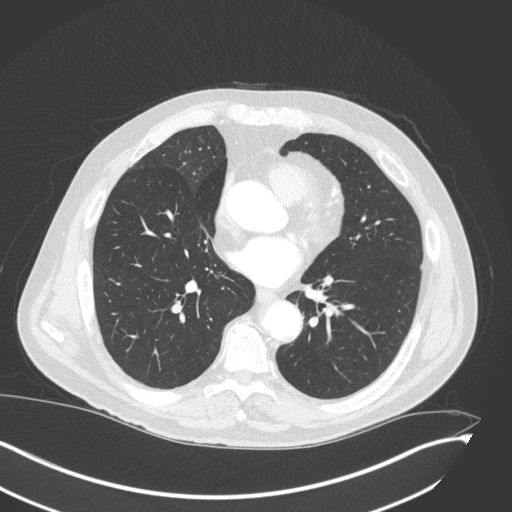
[im 96/172  lung]
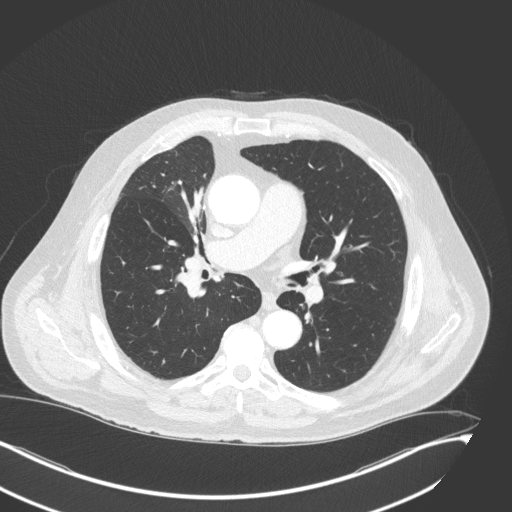
[im 108/172  lung]
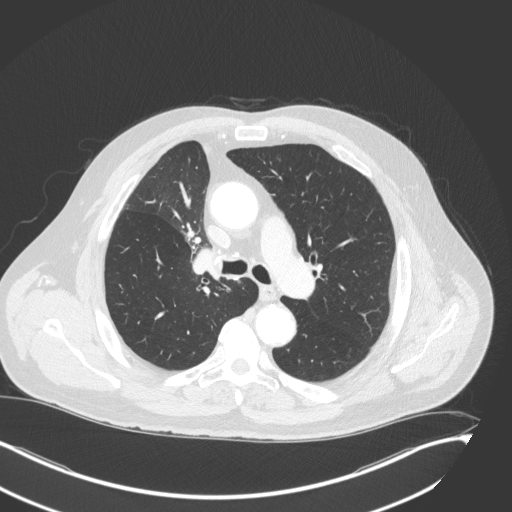
[im 121/172  mediastinal]
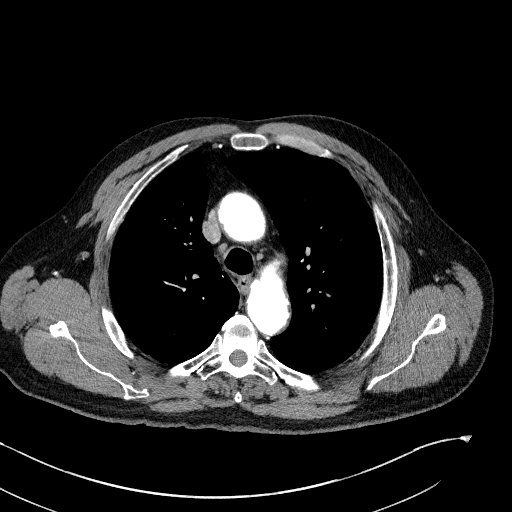
[im 121/172  lung]
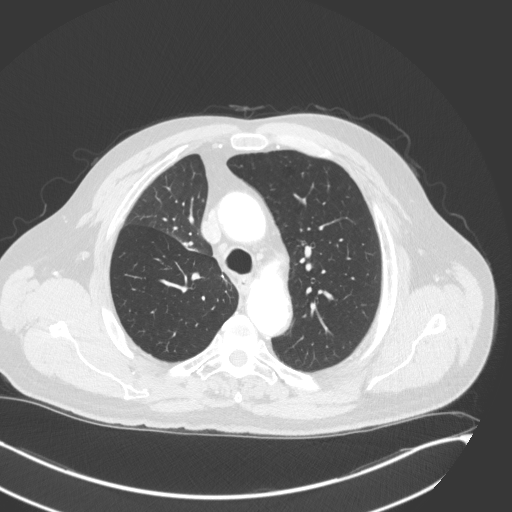
[im 134/172  lung]
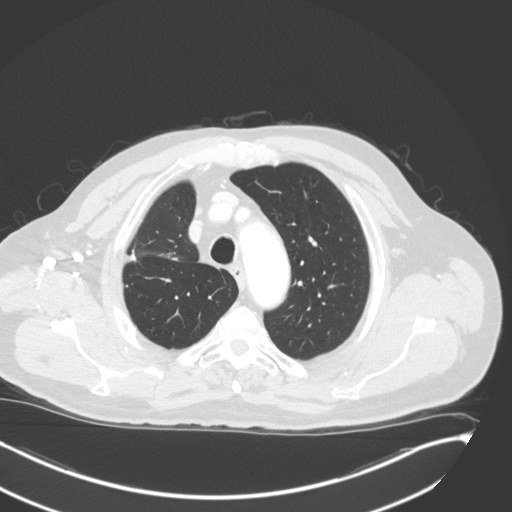
[im 146/172  lung]
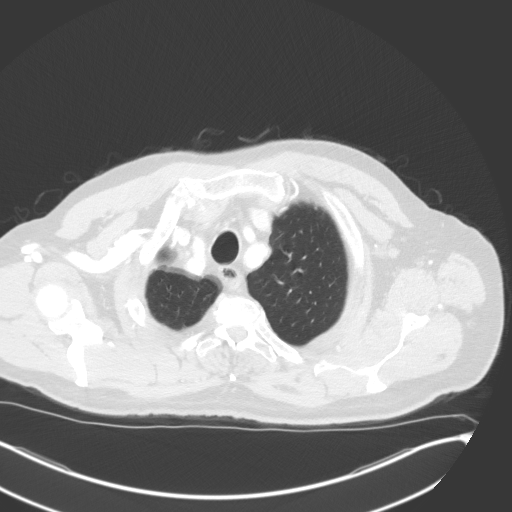
[im 159/172  lung]
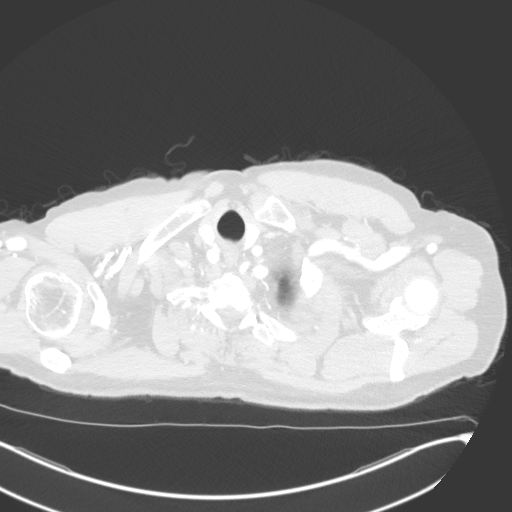

[Series 5: coronal · coronal · 0.71mm/px · 3 of 165 slices shown]
[im 33/165  lung]
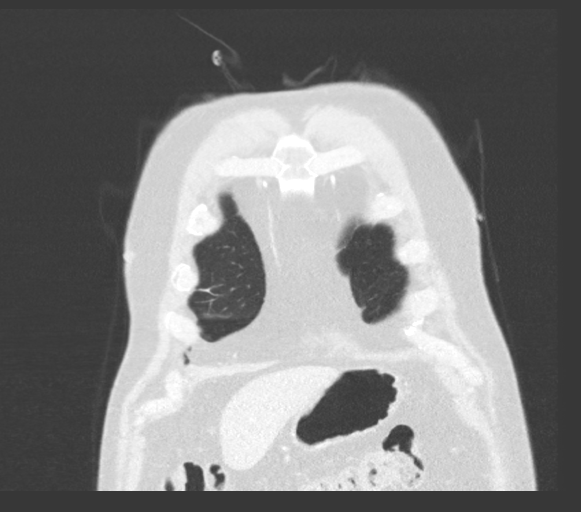
[im 66/165  lung]
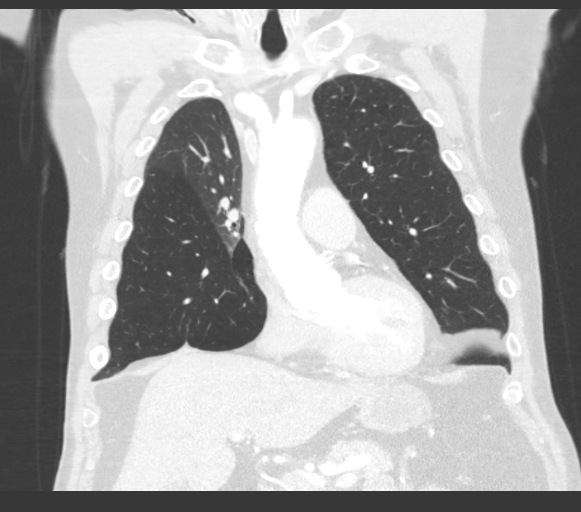
[im 99/165  lung]
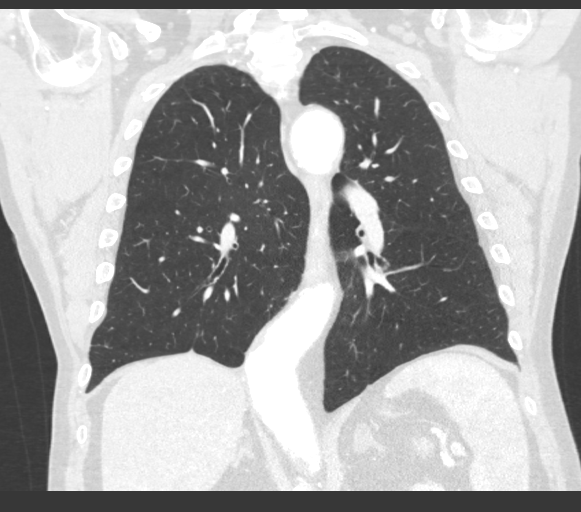

[15 of 36 positions shown; findings below may reference images not displayed]

FINDINGS: Cardiovascular: 4 cm ascending thoracic aortic dilation. Calcified
and noncalcified atheromatous plaque of the thoracic aorta. Findings
are unchanged. Central pulmonary vasculature is dilated to 3.5 cm,
this finding is also stable. Heart size is stable and normal. No
pericardial effusion. Three-vessel coronary artery disease as
before.

Mediastinum/Nodes: No thoracic inlet, axillary, mediastinal or hilar
adenopathy. Esophagus grossly normal.

Lungs/Pleura: Pleural and parenchymal scarring associated with RIGHT
upper lobectomy similar to previous imaging. Airways are patent. No
consolidation or effusion. No suspicious pulmonary nodule. Stable
small LEFT lower lobe pulmonary nodule (image 103/4) 3 mm.

Small RIGHT lower lobe nodule (image 72/4) 5 mm, unchanged.

Upper Abdomen: Incidental imaging of upper abdominal contents with
bilateral adrenal thickening and nodularity, 16 mm on the RIGHT and
13 x 25 mm on the LEFT without change. These are compatible with
small adenomas and are unchanged since 3812. No upper abdominal
lymphadenopathy. No acute upper abdominal findings.

Musculoskeletal: No acute bone finding. No destructive bone process.
Spinal degenerative changes.
IMPRESSION: Postoperative changes of RIGHT upper lobectomy without signs of
disease recurrence.

Stable lower lobe nodules largest on the RIGHT at 5 mm.

Mild aneurysmal dilation of the ascending thoracic aorta 4 cm.
Recommend annual imaging followup by CTA or MRA. This recommendation
follows 2525 ACCF/AHA/AATS/ACR/ASA/SCA/NAM/AUSTRIA/MELL/MOTYE Guidelines
for the Diagnosis and Management of Patients with Thoracic Aortic
Disease. Circulation. 2525; 121: E266-e369. Aortic aneurysm NOS
(TS0FG-HV7.Q)

Aortic Atherosclerosis (TS0FG-DJJ.J).

## 2022-05-12 ENCOUNTER — Other Ambulatory Visit: Payer: Self-pay | Admitting: Internal Medicine

## 2022-05-12 ENCOUNTER — Other Ambulatory Visit: Payer: Self-pay

## 2022-05-12 DIAGNOSIS — I712 Thoracic aortic aneurysm, without rupture, unspecified: Secondary | ICD-10-CM

## 2022-05-12 DIAGNOSIS — J449 Chronic obstructive pulmonary disease, unspecified: Secondary | ICD-10-CM

## 2022-05-23 ENCOUNTER — Other Ambulatory Visit: Payer: Self-pay | Admitting: Internal Medicine

## 2022-05-23 DIAGNOSIS — J449 Chronic obstructive pulmonary disease, unspecified: Secondary | ICD-10-CM

## 2022-05-28 ENCOUNTER — Other Ambulatory Visit: Payer: Self-pay | Admitting: Internal Medicine

## 2022-05-28 DIAGNOSIS — J449 Chronic obstructive pulmonary disease, unspecified: Secondary | ICD-10-CM

## 2022-06-04 ENCOUNTER — Other Ambulatory Visit (HOSPITAL_COMMUNITY): Payer: Self-pay | Admitting: Vascular Surgery

## 2022-06-04 ENCOUNTER — Other Ambulatory Visit: Payer: Self-pay | Admitting: Vascular Surgery

## 2022-06-04 ENCOUNTER — Ambulatory Visit (HOSPITAL_COMMUNITY)
Admission: RE | Admit: 2022-06-04 | Discharge: 2022-06-04 | Disposition: A | Discharge status: Home / Self Care | Payer: PPO | Source: Ambulatory Visit | Attending: Vascular Surgery | Admitting: Vascular Surgery

## 2022-06-04 DIAGNOSIS — I7121 Aneurysm of the ascending aorta, without rupture: Secondary | ICD-10-CM | POA: Insufficient documentation

## 2022-06-04 DIAGNOSIS — I712 Thoracic aortic aneurysm, without rupture, unspecified: Secondary | ICD-10-CM | POA: Insufficient documentation

## 2022-06-04 LAB — POCT I-STAT CREATININE: Creatinine, Ser: 1.6 mg/dL — ABNORMAL HIGH (ref 0.61–1.24)

## 2022-06-04 MED ORDER — IOHEXOL 350 MG/ML SOLN
85.0000 mL | Freq: Once | INTRAVENOUS | Status: AC | PRN
  Administered 2022-06-04: 85 mL via INTRAVENOUS

## 2022-06-08 NOTE — Progress Notes (Signed)
[facility] [place]  ASSESSMENT / PLAN: Nicolas Butler is a 71 y.o. male with an ascending aortic aneurysm measuring 18m. 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 He is status post open repair of abdominal aortic aneurysm in 2018 with Dr. FiOneida AlarHe has established care with a cardiac surgeon (Dr. HeRoxan Hockey and should follow up with him for surveillance of small ascending aortic aneurysm. I counseled him extensively regarding the very low (<1%) risk of aneurysm rupture over the next year.   Recommend the following to reduce the risk of major adverse cardiac / limb events.  Complete cessation from all tobacco products. Excellent blood glucose control with goal A1c < 7%. Blood pressure control with goal blood pressure < 140/90 mmHg. Excellent lipid reduction therapy with goal LDL-C <100 mg/dL. Aspirin 8133mO QD.  Atorvastatin 40-80mg PO QD (or other "high intensity" statin therapy).  Return to [facility] with CT angiogram of chest / abdomen / pelvis in 5 years for aortic surveillance.   CHIEF COMPLAINT: aneurysm  HISTORY OF PRESENT ILLNESS: Nicolas Butler a 71 12o. male for to clinic for evaluation of new thoracic aortic aneurysm.  This was demonstrated on CT scan done in surveillance for lung cancer.  He is status post robotic assisted right upper lobectomy 07/22/2020.  Surveillance for lung cancer demonstrated 42 mm ascending aortic aneurysm.  Patient preferred to follow-up with us [place] of his history with Dr. FieOneida AlarHe has no symptoms referable to his aneurysm.  He is due for total aortic surveillance next year.  06/09/22: Returns for follow-up of aortic repair done in 2018.  He is doing well overall.  I reviewed his CT angiogram with him in detail.  He has no complaints.  He does have a small ventral hernia, which is asymptomatic to him.  Past Medical History:  Diagnosis Date   AAA (abdominal aortic aneurysm) (HCC)    CHF (congestive heart failure) (HCC)     Chronic kidney disease (CKD), stage III (moderate) (HCC)    Complication of anesthesia    Bowel and bladder are slow to wake up after surgery   COPD (chronic obstructive pulmonary disease) ([place]CC[place]  Coronary artery disease    presumed   Dyspnea    exertion   High cholesterol    Lung cancer ([place]CC[place]  Unspecified essential hypertension     Past Surgical History:  Procedure Laterality Date   ABDOMINAL AORTIC ANEURYSM REPAIR N/A 06/28/2017   Procedure: ANEURYSM ABDOMINAL AORTIC REPAIR OPEN;  Surgeon: FieElam DutchD;  [facility];  Laterality: N/A;   BRONCHIAL BRUSHINGS  07/02/2020   Procedure: BRONCHIAL BRUSHINGS;  Surgeon: DesSpero GeraldsD;  [facility];  Service: Pulmonary;;   BRONCHIAL WASHINGS  07/02/2020   Procedure: BRONCHIAL WASHINGS;  Surgeon: DesSpero GeraldsD;  [facility];  Service: Pulmonary;;  BAL and washings   HAND SURGERY Left    HERNIA REPAIR     INCISIONAL HERNIA REPAIR     LEFT   INTERCOSTAL NERVE BLOCK Right 07/22/2020   Procedure: INTERCOSTAL NERVE BLOCK;  Surgeon: HenMelrose NakayamaD;  [facility];  Laterality: Right;   IRRIGATION AND DEBRIDEMENT SEBACEOUS CYST     FROM RIGHT SHOULDER   LUNG BIOPSY  07/02/2020   Procedure: LUNG BIOPSY;  Surgeon: DesSpero GeraldsD;  [facility];  Service: Pulmonary;;   NODE DISSECTION Right 07/22/2020   Procedure:  NODE DISSECTION;  Surgeon: Melrose Nakayama, MD;  Location: [facility];  Service: Thoracic;  Laterality: Right;   UMBILICAL HERNIA REPAIR     VIDEO BRONCHOSCOPY Right 07/02/2020   Procedure: VIDEO BRONCHOSCOPY WITHOUT FLUORO;  Surgeon: Spero Geralds, MD;  [facility];  Service: Pulmonary;  Laterality: Right;    Family History  Problem Relation Age of Onset   Pneumonia Mother    Other Father        brain tumor   Diabetes Son    Lung cancer Neg Hx     Social History   Socioeconomic History    Marital status: Legally Separated    Spouse name: Not on file   Number of children: 2   Years of education: Not on file   Highest education level: Not on file  Occupational History   Occupation: retired  Tobacco Use   Smoking status: Former    Packs/day: 1.50    Years: 46.00    Total pack years: 69.00    Types: Cigarettes    Start date: 05/17/1965    Quit date: 12/08/2012    Years since quitting: 9.5   Smokeless tobacco: Never  Vaping Use   Vaping Use: Never used  Substance and Sexual Activity   Alcohol use: No    Alcohol/week: 0.0 standard drinks of alcohol   Drug use: No   Sexual activity: Not Currently  Other Topics Concern   Not on file  Social History Narrative   Not on file   Social Determinants of Health   Financial Resource Strain: Low Risk  (09/02/2020)   Overall Financial Resource Strain (CARDIA)    Difficulty of Paying Living Expenses: Not hard at all  Food Insecurity: No Food Insecurity (09/02/2020)   Hunger Vital Sign    Worried About Running Out of Food in the Last Year: Never true    [place] in the Last Year: Never true  Transportation Needs: No Transportation Needs (09/02/2020)   PRAPARE - Hydrologist (Medical): No    Lack of Transportation (Non-Medical): No  Physical Activity: Insufficiently Active (09/02/2020)   Exercise Vital Sign    Days of Exercise per Week: 1 day    Minutes of Exercise per Session: 20 min  Stress: No Stress Concern Present (09/02/2020)   [place]    Feeling of Stress : Not at all  Social Connections: Moderately Isolated (09/02/2020)   Social Connection and Isolation Panel [NHANES]    Frequency of Communication with Friends and Family: Three times a week    Frequency of Social Gatherings with Friends and Family: Twice a week    Attends Religious Services: More than 4 times per year    Active Member of [employer] or Organizations:  No    Attends Archivist Meetings: Never    Marital Status: Separated  Intimate Partner Violence: Not At Risk (09/02/2020)   Humiliation, Afraid, Rape, and Kick questionnaire    Fear of Current or Ex-Partner: No    Emotionally Abused: No    Physically Abused: No    Sexually Abused: No    Allergies  Allergen Reactions   Hydrochlorothiazide Other (See Comments)    Pt states "it made my legs hurt"   Pravastatin Other (See Comments)    Muscle pain    Current Outpatient Medications  Medication Sig Dispense Refill   albuterol (PROVENTIL) (2.5 MG/3ML) 0.083% nebulizer solution Take 2.5 mg  by nebulization every 6 (six) hours as needed for wheezing or shortness of breath.      albuterol (VENTOLIN HFA) 108 (90 Base) MCG/ACT inhaler INHALE 2 PUFFS INTO THE LUNGS EVERY 6 HOURS AS NEEDED 18 each 5   amLODipine (NORVASC) 5 MG tablet Take 5 mg by mouth daily.     aspirin EC 81 MG tablet Take 1 tablet (81 mg total) by mouth daily. Swallow whole. 90 tablet 3   atorvastatin (LIPITOR) 80 MG tablet Take 80 mg by mouth every other day. AT NIGHT     BREZTRI AEROSPHERE 160-9-4.8 MCG/ACT AERO INHALE 2 PUFFS INTO THE LUNGS IN THE MORNING AND AT BEDTIME. 10.7 each 0   carvedilol (COREG) 3.125 MG tablet Take 1 tablet (3.125 mg total) by mouth 2 (two) times daily. 60 tablet 6   Cholecalciferol (VITAMIN D3) 5000 units CAPS Take 10,000 Units by mouth daily after breakfast.      lisinopril (PRINIVIL,ZESTRIL) 40 MG tablet Take 1 tablet (40 mg total) by mouth daily. 30 tablet 6   No current facility-administered medications for this visit.    PHYSICAL EXAM Vitals:   06/09/22 0909  BP: 130/82  Pulse: (!) 53  Resp: 20  Temp: 98.6 F (37 C)  SpO2: 96%  Weight: 196 lb (88.9 kg)  Height: _0  (1.753 m)     Constitutional: well appearing. no distress. Appears well nourished.  Neurologic: CN intact. no focal findings. no sensory loss. Psychiatric:  Mood and affect symmetric and  appropriate. Eyes:  No icterus. No conjunctival pallor. Ears, nose, throat:  mucous membranes moist. Midline trachea.  Cardiac: regular rate and rhythm.  Respiratory:  unlabored. Abdominal:  soft, non-tender, non-distended. Upper midline hernia. Extremity: no edema. no cyanosis. no pallor.  Skin: no gangrene. no ulceration.  Lymphatic: no Stemmer's sign. no palpable lymphadenopathy.  PERTINENT LABORATORY AND RADIOLOGIC DATA  Most recent CBC    Latest Ref Rng & Units 03/05/2022    8:01 AM 08/28/2021   11:48 AM 04/22/2021    9:48 AM  CBC  WBC 4.0 - 10.5 K/uL 6.5  8.3  7.3   Hemoglobin 13.0 - 17.0 g/dL 14.9  15.8  15.2   Hematocrit 39.0 - 52.0 % 41.5  44.9  43.2   Platelets 150 - 400 K/uL 178  177  164      Most recent CMP    Latest Ref Rng & Units 06/04/2022   11:40 AM 03/05/2022    8:01 AM 08/28/2021   11:48 AM  CMP  Glucose 70 - 99 mg/dL  116  90   BUN 8 - 23 mg/dL  18  17   Creatinine 0.61 - 1.24 mg/dL 1.60  1.34  1.43   Sodium 135 - 145 mmol/L  135  136   Potassium 3.5 - 5.1 mmol/L  4.0  4.0   Chloride 98 - 111 mmol/L  104  101   CO2 22 - 32 mmol/L  26  28   Calcium 8.9 - 10.3 mg/dL  8.9  9.1   Total Protein 6.5 - 8.1 g/dL  7.4  7.7   Total Bilirubin 0.3 - 1.2 mg/dL  1.3  0.7   Alkaline Phos 38 - 126 U/L  83  87   AST 15 - 41 U/L  18  17   ALT 0 - 44 U/L  18  19    CT angiogram of chest, abdomen, and pelvis personally reviewed Small ascending aortic aneurysm which is unchanged in size since  last evaluation, measuring about 42 mm Pararenal abdominal aortic aneurysm above the tube graft repair measuring 34 mm. Infrarenal aortic repair is widely patent, and is anastomosed to the aortic bifurcation.  The iliac arteries are widely patent.  The imaged femoral arteries are widely patent.  Yevonne Aline. Stanford Breed, MD [facility] Phone Number: [phone] 06/09/2022 9:19 AM  Total time spent on preparing this encounter including chart  review, data review, collecting history, examining the patient, coordinating care for this established patient, 30 minutes.  Portions of this report may have been transcribed using voice recognition software.  Every effort has been made to ensure accuracy; however, inadvertent computerized transcription errors may still be present.

## 2022-06-09 ENCOUNTER — Other Ambulatory Visit: Payer: Self-pay | Admitting: Internal Medicine

## 2022-06-09 ENCOUNTER — Encounter: Payer: Self-pay | Admitting: Vascular Surgery

## 2022-06-09 ENCOUNTER — Ambulatory Visit (INDEPENDENT_AMBULATORY_CARE_PROVIDER_SITE_OTHER): Payer: PPO | Admitting: Vascular Surgery

## 2022-06-09 VITALS — BP 130/82 | HR 53 | Temp 98.6°F | Resp 20 | Ht 69.0 in | Wt 196.0 lb

## 2022-06-09 DIAGNOSIS — I7141 Pararenal abdominal aortic aneurysm, without rupture: Secondary | ICD-10-CM | POA: Diagnosis not present

## 2022-06-09 DIAGNOSIS — I7121 Aneurysm of the ascending aorta, without rupture: Secondary | ICD-10-CM | POA: Diagnosis not present

## 2022-06-09 DIAGNOSIS — Z9889 Other specified postprocedural states: Secondary | ICD-10-CM | POA: Diagnosis not present

## 2022-06-09 DIAGNOSIS — Z8679 Personal history of other diseases of the circulatory system: Secondary | ICD-10-CM

## 2022-06-09 DIAGNOSIS — J4489 Other specified chronic obstructive pulmonary disease: Secondary | ICD-10-CM

## 2022-08-04 ENCOUNTER — Other Ambulatory Visit: Payer: Self-pay | Admitting: Internal Medicine

## 2022-08-04 DIAGNOSIS — J4489 Other specified chronic obstructive pulmonary disease: Secondary | ICD-10-CM

## 2022-08-18 ENCOUNTER — Other Ambulatory Visit: Payer: Self-pay | Admitting: Internal Medicine

## 2022-08-18 DIAGNOSIS — J4489 Other specified chronic obstructive pulmonary disease: Secondary | ICD-10-CM

## 2022-08-25 ENCOUNTER — Other Ambulatory Visit: Payer: Self-pay | Admitting: Internal Medicine

## 2022-08-25 DIAGNOSIS — J439 Emphysema, unspecified: Secondary | ICD-10-CM

## 2022-08-25 MED ORDER — BREZTRI AEROSPHERE 160-9-4.8 MCG/ACT IN AERO: 2.0000 | INHALATION_SPRAY | Freq: Two times a day (BID) | RESPIRATORY_TRACT | 1 refills | Status: DC

## 2022-08-27 ENCOUNTER — Encounter: Payer: Self-pay | Admitting: Pulmonary Disease

## 2022-08-27 ENCOUNTER — Ambulatory Visit (INDEPENDENT_AMBULATORY_CARE_PROVIDER_SITE_OTHER): Payer: PPO | Admitting: Pulmonary Disease

## 2022-08-27 ENCOUNTER — Other Ambulatory Visit: Payer: Self-pay

## 2022-08-27 VITALS — BP 132/76 | HR 58 | Temp 97.7°F | Ht 69.0 in | Wt 196.0 lb

## 2022-08-27 DIAGNOSIS — C3411 Malignant neoplasm of upper lobe, right bronchus or lung: Secondary | ICD-10-CM

## 2022-08-27 DIAGNOSIS — J4489 Other specified chronic obstructive pulmonary disease: Secondary | ICD-10-CM

## 2022-08-27 DIAGNOSIS — J449 Chronic obstructive pulmonary disease, unspecified: Secondary | ICD-10-CM | POA: Insufficient documentation

## 2022-08-27 DIAGNOSIS — J439 Emphysema, unspecified: Secondary | ICD-10-CM

## 2022-08-27 MED ORDER — BREZTRI AEROSPHERE 160-9-4.8 MCG/ACT IN AERO: 2.0000 | INHALATION_SPRAY | Freq: Two times a day (BID) | RESPIRATORY_TRACT | 11 refills | Status: DC

## 2022-08-27 NOTE — Assessment & Plan Note (Signed)
His CT chest from 05/2022 showed stable nodules. 55-month follow-up CT would be advised

## 2022-08-27 NOTE — Assessment & Plan Note (Signed)
[name] has worked well for him and we will provide him with refills. He will use albuterol for rescue. We discussed signs and symptoms of COPD exacerbation and action plan should this occur

## 2022-08-27 NOTE — Progress Notes (Signed)
Subjective:    Patient ID: Nicolas Butler, male    DOB: Mar 26, 1951, 71 y.o. y.o.   MRN: [id number]  HPI  71 yo man with COPD & lung cancer presents to establish care. He was last seen by my partner Dr. 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 Roxan Hockey on 07/22/2020.  - declined adjuvant chemo , PD-L1 0% - acute exacerbation in December 2021    [name] - aortobifem graft BL renal arerty stenosis CKD stage III He smoked 70 pYrs before he quit 2014  Chief Complaint  Patient presents with   New Patient (Initial Visit)    Dr. Shearon Stalls in 2022 for COPD. Wants refills on breztri    He feels that [name] really helped him the best and his breathing felt like a new person.  He would like refills today. He rarely needs albuterol. His last exacerbation was December 2021, he does not get chest colds often. He remains on 40 mg of lisinopril for hypertension and denies cough or wheezing. He does not want vaccinations   Significant tests/ events reviewed  05/2022 CT C/A/P >> 4.3 cm asc TAA, 19m aneurysm of left subclavian, RLL nodule 6 mm x 2 , stable from 12/20222  PFTs 07/2020 -14% postbronchodilator improvement, FEV1 64%, FVC 91%, TLC 133%, DLCO 91/23.1   Past Medical History:  Diagnosis Date   AAA (abdominal aortic aneurysm) (HCC)    CHF (congestive heart failure) (HCC)    Chronic kidney disease (CKD), stage III (moderate) (HCC)    Complication of anesthesia    Bowel and bladder are slow to wake up after surgery   COPD (chronic obstructive pulmonary disease) ([place]    Coronary artery disease    presumed   Dyspnea    exertion   High cholesterol    Lung cancer ([place]    Unspecified essential hypertension     Past Surgical History:  Procedure Laterality Date   ABDOMINAL AORTIC ANEURYSM REPAIR N/A 06/28/2017   Procedure: ANEURYSM ABDOMINAL AORTIC  REPAIR OPEN;  Surgeon: FElam Dutch MD;  [facility];  Laterality: N/A;   BRONCHIAL BRUSHINGS  07/02/2020   Procedure: BRONCHIAL BRUSHINGS;  Surgeon: DSpero Geralds MD;  [facility];  Service: Pulmonary;;   BRONCHIAL WASHINGS  07/02/2020   Procedure: BRONCHIAL WASHINGS;  Surgeon: DSpero Geralds MD;  [facility];  Service: Pulmonary;;  BAL and washings   HAND SURGERY Left    HERNIA REPAIR     INCISIONAL HERNIA REPAIR     LEFT   INTERCOSTAL NERVE BLOCK Right 07/22/2020   Procedure: INTERCOSTAL NERVE BLOCK;  Surgeon: HMelrose Nakayama MD;  [place]  Service: Thoracic;  Laterality: Right;   IRRIGATION AND DEBRIDEMENT SEBACEOUS CYST     FROM RIGHT SHOULDER   LUNG BIOPSY  07/02/2020   Procedure: LUNG BIOPSY;  Surgeon: DSpero Geralds MD;  [facility];  Service: Pulmonary;;   NODE DISSECTION Right 07/22/2020   Procedure: NODE DISSECTION;  Surgeon: HMelrose Nakayama MD;  [place]  Service: Thoracic;  Laterality: Right;   UMBILICAL HERNIA REPAIR     VIDEO BRONCHOSCOPY Right 07/02/2020   Procedure: VIDEO BRONCHOSCOPY WITHOUT FLUORO;  Surgeon: DSpero Geralds MD;  [facility];  Service: Pulmonary;  Laterality: Right;     Review of Systems neg for any significant sore throat,  dysphagia, itching, sneezing, nasal congestion or excess/ purulent secretions, fever, chills, sweats, unintended wt loss, pleuritic or exertional cp, hempoptysis, orthopnea pnd or change in chronic leg swelling. Also denies presyncope, palpitations, heartburn, abdominal pain, nausea, vomiting, diarrhea or change in bowel or urinary habits, dysuria,hematuria, rash, arthralgias, visual complaints, headache, numbness weakness or ataxia.     Objective:   Physical Exam  Gen. Pleasant, well-nourished, in no distress ENT - no thrush, no pallor/icterus,no post nasal drip Neck: No JVD, no thyromegaly, no carotid bruits Lungs: no  use of accessory muscles, no dullness to percussion, decreased without rales or rhonchi  Cardiovascular: Rhythm regular, heart sounds  normal, no murmurs or gallops, no peripheral edema Musculoskeletal: No deformities, no cyanosis or clubbing        Assessment & Plan:

## 2022-08-27 NOTE — Patient Instructions (Signed)
X refills on [employer]

## 2022-09-14 ENCOUNTER — Inpatient Hospital Stay: Payer: PPO | Attending: Hematology

## 2022-09-14 ENCOUNTER — Ambulatory Visit (HOSPITAL_COMMUNITY)
Admission: RE | Admit: 2022-09-14 | Discharge: 2022-09-14 | Disposition: A | Discharge status: Home / Self Care | Payer: PPO | Source: Ambulatory Visit | Attending: Hematology | Admitting: Hematology

## 2022-09-14 DIAGNOSIS — Z08 Encounter for follow-up examination after completed treatment for malignant neoplasm: Secondary | ICD-10-CM | POA: Insufficient documentation

## 2022-09-14 DIAGNOSIS — Z85118 Personal history of other malignant neoplasm of bronchus and lung: Secondary | ICD-10-CM | POA: Insufficient documentation

## 2022-09-14 DIAGNOSIS — C3491 Malignant neoplasm of unspecified part of right bronchus or lung: Secondary | ICD-10-CM

## 2022-09-14 LAB — CBC WITH DIFFERENTIAL/PLATELET
Abs Immature Granulocytes: 0.01 10*3/uL (ref 0.00–0.07)
Basophils Absolute: 0.1 10*3/uL (ref 0.0–0.1)
Basophils Relative: 1 %
Eosinophils Absolute: 0.3 10*3/uL (ref 0.0–0.5)
Eosinophils Relative: 5 %
HCT: 44.7 % (ref 39.0–52.0)
Hemoglobin: 15.5 g/dL (ref 13.0–17.0)
Immature Granulocytes: 0 %
Lymphocytes Relative: 23 %
Lymphs Abs: 1.3 10*3/uL (ref 0.7–4.0)
MCH: 34.8 pg — ABNORMAL HIGH (ref 26.0–34.0)
MCHC: 34.7 g/dL (ref 30.0–36.0)
MCV: 100.2 fL — ABNORMAL HIGH (ref 80.0–100.0)
Monocytes Absolute: 0.4 10*3/uL (ref 0.1–1.0)
Monocytes Relative: 7 %
Neutro Abs: 3.7 10*3/uL (ref 1.7–7.7)
Neutrophils Relative %: 64 %
Platelets: 168 10*3/uL (ref 150–400)
RBC: 4.46 MIL/uL (ref 4.22–5.81)
RDW: 12.9 % (ref 11.5–15.5)
WBC: 5.7 10*3/uL (ref 4.0–10.5)
nRBC: 0 % (ref 0.0–0.2)

## 2022-09-14 LAB — CORTISOL: Cortisol, Plasma: 6.9 ug/dL

## 2022-09-14 LAB — COMPREHENSIVE METABOLIC PANEL
ALT: 22 U/L (ref 0–44)
AST: 19 U/L (ref 15–41)
Albumin: 4 g/dL (ref 3.5–5.0)
Alkaline Phosphatase: 76 U/L (ref 38–126)
Anion gap: 7 (ref 5–15)
BUN: 16 mg/dL (ref 8–23)
CO2: 26 mmol/L (ref 22–32)
Calcium: 8.9 mg/dL (ref 8.9–10.3)
Chloride: 100 mmol/L (ref 98–111)
Creatinine, Ser: 1.21 mg/dL (ref 0.61–1.24)
GFR, Estimated: >60 mL/min (ref >60)
Glucose, Bld: 109 mg/dL — ABNORMAL HIGH (ref 70–99)
Potassium: 3.9 mmol/L (ref 3.5–5.1)
Sodium: 133 mmol/L — ABNORMAL LOW (ref 135–145)
Total Bilirubin: 0.9 mg/dL (ref 0.3–1.2)
Total Protein: 7.3 g/dL (ref 6.5–8.1)

## 2022-09-14 MED ORDER — IOHEXOL 300 MG/ML  SOLN
75.0000 mL | Freq: Once | INTRAMUSCULAR | Status: AC | PRN
  Administered 2022-09-14: 75 mL via INTRAVENOUS

## 2022-09-15 LAB — ACTH: C206 ACTH: 13.8 pg/mL (ref 7.2–63.3)

## 2022-09-22 ENCOUNTER — Encounter: Payer: Self-pay | Admitting: Hematology

## 2022-09-22 ENCOUNTER — Inpatient Hospital Stay (HOSPITAL_BASED_OUTPATIENT_CLINIC_OR_DEPARTMENT_OTHER): Payer: PPO | Admitting: Hematology

## 2022-09-22 ENCOUNTER — Inpatient Hospital Stay: Payer: PPO | Admitting: Hematology

## 2022-09-22 VITALS — Wt 190.0 lb

## 2022-09-22 DIAGNOSIS — C3491 Malignant neoplasm of unspecified part of right bronchus or lung: Secondary | ICD-10-CM | POA: Diagnosis not present

## 2022-09-22 NOTE — Progress Notes (Signed)
Virtual Visit via Telephone Note  I connected with Nicolas Butler on 09/22/22 at  4:00 PM EST by telephone and verified that I am speaking with the correct person using two identifiers.  Location: Patient: At home Provider: In office   I discussed the limitations, risks, security and privacy concerns of performing an evaluation and management service by telephone and the availability of in person appointments. I also discussed with the patient that there may be a patient responsible charge related to this service. The patient expressed understanding and agreed to proceed.   History of Present Illness: [name] had right upper lobectomy and lymph node dissection on 07/22/2020 by Dr. Dorris Fetch.  Pathology showed pT3 pN0.  Adjuvant chemotherapy was recommended but declined by the patient.  He has been on active surveillance since then.   Observations/Objective: He denies any new onset chest pains.  No cough or hemoptysis.  He reportedly lost 20 pounds in the last few months, but was trying to lose weight.  He is reportedly taking milk thistle which helped him lose weight.  Denies any fevers or night sweats.  Assessment and Plan:  1.  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 - Reviewed labs from 09/14/2022 which showed normal LFTs and CBC.  Cortisol and ACTH were normal. - Recommend RTC 6 months with repeat labs and scan.   Follow Up Instructions:    I discussed the assessment and treatment plan with the patient. The patient was provided an opportunity to ask questions and all were answered. The patient agreed with the plan and demonstrated an understanding of the instructions.   The patient was advised to call back or seek an in-person evaluation if the symptoms worsen  or if the condition fails to improve as anticipated.  I provided 21 minutes of non-face-to-face time during this encounter.   [name], MD

## 2022-11-17 ENCOUNTER — Encounter: Payer: Self-pay | Admitting: Nurse Practitioner

## 2022-11-17 ENCOUNTER — Ambulatory Visit: Payer: PPO | Attending: Nurse Practitioner | Admitting: Nurse Practitioner

## 2022-11-17 VITALS — BP 146/80 | HR 56 | Ht 69.0 in | Wt 199.0 lb

## 2022-11-17 DIAGNOSIS — I10 Essential (primary) hypertension: Secondary | ICD-10-CM

## 2022-11-17 DIAGNOSIS — I5022 Chronic systolic (congestive) heart failure: Secondary | ICD-10-CM | POA: Diagnosis not present

## 2022-11-17 DIAGNOSIS — Z9889 Other specified postprocedural states: Secondary | ICD-10-CM | POA: Diagnosis not present

## 2022-11-17 DIAGNOSIS — Z8679 Personal history of other diseases of the circulatory system: Secondary | ICD-10-CM

## 2022-11-17 DIAGNOSIS — I2510 Atherosclerotic heart disease of native coronary artery without angina pectoris: Secondary | ICD-10-CM | POA: Diagnosis not present

## 2022-11-17 DIAGNOSIS — I7121 Aneurysm of the ascending aorta, without rupture: Secondary | ICD-10-CM

## 2022-11-17 DIAGNOSIS — I671 Cerebral aneurysm, nonruptured: Secondary | ICD-10-CM

## 2022-11-17 DIAGNOSIS — E785 Hyperlipidemia, unspecified: Secondary | ICD-10-CM

## 2022-11-17 MED ORDER — ASPIRIN 81 MG PO TBEC: 81.0000 mg | DELAYED_RELEASE_TABLET | Freq: Every day | ORAL | 3 refills | Status: DC

## 2022-11-17 MED ORDER — CARVEDILOL 3.125 MG PO TABS: 3.1250 mg | ORAL_TABLET | Freq: Two times a day (BID) | ORAL | 3 refills | Status: DC

## 2022-11-17 MED ORDER — AMLODIPINE BESYLATE 5 MG PO TABS: 5.0000 mg | ORAL_TABLET | Freq: Every day | ORAL | 3 refills | Status: DC

## 2022-11-17 MED ORDER — ATORVASTATIN CALCIUM 80 MG PO TABS: 80.0000 mg | ORAL_TABLET | ORAL | 3 refills | Status: AC

## 2022-11-17 MED ORDER — LISINOPRIL 40 MG PO TABS: 40.0000 mg | ORAL_TABLET | Freq: Every day | ORAL | 3 refills | Status: DC

## 2022-11-17 NOTE — Patient Instructions (Signed)

## 2022-11-17 NOTE — Progress Notes (Signed)
Office Visit    Patient Name: Nicolas Butler Date of Encounter: 11/17/2022  PCP:  Lavella Lemons, [facility]  Cardiologist:  Carlyle Dolly, MD  Advanced Practice Provider:  Finis Bud, NP Electrophysiologist:  None   Chief Complaint    Nicolas Butler is a 72 y.o. 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    Past Medical History    Past Medical History:  Diagnosis Date   AAA (abdominal aortic aneurysm) (HCC)    CHF (congestive heart failure) (HCC)    Chronic kidney disease (CKD), stage III (moderate) (HCC)    Complication of anesthesia    Bowel and bladder are slow to wake up after surgery   COPD (chronic obstructive pulmonary disease) ([place])    Coronary artery disease    presumed   Dyspnea    exertion   High cholesterol    Lung cancer ([place])    Unspecified essential hypertension    Past Surgical History:  Procedure Laterality Date   ABDOMINAL AORTIC ANEURYSM REPAIR N/A 06/28/2017   Procedure: ANEURYSM ABDOMINAL AORTIC REPAIR OPEN;  Surgeon: Elam Dutch, MD;  Location: [place];  Service: Vascular;  Laterality: N/A;   BRONCHIAL BRUSHINGS  07/02/2020   Procedure: BRONCHIAL BRUSHINGS;  Surgeon: Spero Geralds, MD;  Location: [facility];  Service: Pulmonary;;   BRONCHIAL WASHINGS  07/02/2020   Procedure: BRONCHIAL WASHINGS;  Surgeon: Spero Geralds, MD;  Location: [facility];  Service: Pulmonary;;  BAL and washings   HAND SURGERY Left    HERNIA REPAIR     INCISIONAL HERNIA REPAIR     LEFT   INTERCOSTAL NERVE BLOCK Right 07/22/2020   Procedure: INTERCOSTAL NERVE BLOCK;  Surgeon: Melrose Nakayama, MD;  Location: [place];  Service: Thoracic;  Laterality: Right;   IRRIGATION AND DEBRIDEMENT SEBACEOUS CYST     FROM RIGHT SHOULDER   LUNG BIOPSY  07/02/2020    Procedure: LUNG BIOPSY;  Surgeon: Spero Geralds, MD;  Location: [facility];  Service: Pulmonary;;   NODE DISSECTION Right 07/22/2020   Procedure: NODE DISSECTION;  Surgeon: Melrose Nakayama, MD;  Location: [place];  Service: Thoracic;  Laterality: Right;   UMBILICAL HERNIA REPAIR     VIDEO BRONCHOSCOPY Right 07/02/2020   Procedure: VIDEO BRONCHOSCOPY WITHOUT FLUORO;  Surgeon: Spero Geralds, MD;  Location: [facility];  Service: Pulmonary;  Laterality: Right;    Allergies  Allergies  Allergen Reactions   Hydrochlorothiazide Other (See Comments)    Pt states "it made my legs hurt"   Pravastatin Other (See Comments)    Muscle pain    History of Present Illness    Nicolas Butler is a 72 y.o. male with a PMH as mentioned above.  Last seen by Dr. Carlyle Dolly on November 06, 2021.  Patient was doing well at this time.  Was started on aspirin 81 mg daily.  Today he presents for 1 year follow-up.  He states he is doing well. Denies any chest pain, shortness of breath, palpitations, syncope, presyncope, dizziness, orthopnea, PND, swelling or significant weight changes, acute bleeding, or claudication. Requesting refills on his cardiac meds. Very active in his free time.   SH: Retired Insurance underwriter from [employer], daughter is Therapist, sports in [place].  EKGs/Labs/Other Studies Reviewed:   The following studies were  reviewed today:   EKG:  EKG is ordered today.  The ekg ordered today demonstrates sinus bradycardia, 50 bpm, nonspecific T wave abnormality, no acute ischemic changes.  Echocardiogram on April 30, 2017: Study Conclusions   - Left ventricle: The cavity size was normal. Wall thickness was    increased in a pattern of moderate LVH. Systolic function was    normal. The estimated ejection fraction was in the range of 60%    to 65%. Wall motion was normal; there were no regional wall    motion abnormalities. Doppler parameters are consistent with    abnormal left ventricular  relaxation (grade 1 diastolic    dysfunction).  - Aortic valve: Valve area (VTI): 3.52 cm^2. Valve area (Vmax):    3.11 cm^2. Valve area (Vmean): 3.12 cm^2.  - Aorta: Aortic root dimension: 40 mm (ED). Ascending aortic    diameter: 40 mm (S).  - Aortic root: The aortic root was mildly dilated.  - Left atrium: The atrium was moderately dilated.  - Technically adequate study.  Lexiscan on March 26, 2017: Nuclear stress EF: 61%. There was no ST segment deviation noted during stress. Defect 1: There is a defect present in the basal inferior, basal inferolateral, mid inferior, mid inferolateral and apical inferior location. Findings consistent with ischemia. This is an intermediate risk study. The left ventricular ejection fraction is normal (55-65%).   There is a large area, moderate severity defect in the basal and mid inferolateral, inferior and apical inferior walls (SDS=6) consistent with ischemia in the LCX territory.    Recent Labs: 09/14/2022: ALT 22; BUN 16; Creatinine, Ser 1.21; Hemoglobin 15.5; Platelets 168; Potassium 3.9; Sodium 133  Recent Lipid Panel No results found for: "CHOL", "TRIG", "HDL", "CHOLHDL", "VLDL", "LDLCALC", "LDLDIRECT"   Home Medications   Current Meds  Medication Sig   albuterol (PROVENTIL) (2.5 MG/3ML) 0.083% nebulizer solution Take 2.5 mg by nebulization every 6 (six) hours as needed for wheezing or shortness of breath.    albuterol (VENTOLIN HFA) 108 (90 Base) MCG/ACT inhaler INHALE 2 PUFFS INTO THE LUNGS EVERY 6 HOURS AS NEEDED   aspirin EC 81 MG tablet Take 1 tablet (81 mg total) by mouth daily. Swallow whole.   aspirin EC 81 MG tablet Take 1 tablet (81 mg total) by mouth daily. Swallow whole.   Budeson-Glycopyrrol-Formoterol (BREZTRI AEROSPHERE) 160-9-4.8 MCG/ACT AERO Inhale 2 puffs into the lungs in the morning and at bedtime.   Cholecalciferol (VITAMIN D3) 5000 units CAPS Take 10,000 Units by mouth daily after breakfast.    [DISCONTINUED]  amLODipine (NORVASC) 5 MG tablet Take 5 mg by mouth daily.   [DISCONTINUED] atorvastatin (LIPITOR) 80 MG tablet Take 80 mg by mouth every other day. AT NIGHT   [DISCONTINUED] carvedilol (COREG) 3.125 MG tablet Take 1 tablet (3.125 mg total) by mouth 2 (two) times daily.   [DISCONTINUED] lisinopril (PRINIVIL,ZESTRIL) 40 MG tablet Take 1 tablet (40 mg total) by mouth daily.     Review of Systems    All other systems reviewed and are otherwise negative except as noted above.  Physical Exam    VS:  BP (!) 146/80   Pulse (!) 56   Ht '5\' 9"'$  (1.753 m)   Wt 199 lb (90.3 kg)   SpO2 97%   BMI 29.39 kg/m  , BMI Body mass index is 29.39 kg/m.  Wt Readings from Last 3 Encounters:  11/17/22 199 lb (90.3 kg)  09/22/22 190 lb (86.2 kg)  08/27/22 196 lb (88.9 kg)  GEN: Well nourished, well developed, in no acute distress. HEENT: normal. Neck: Supple, no JVD, carotid bruits, or masses. Cardiac: S1/S2, RRR, no murmurs, rubs, or gallops. No clubbing, cyanosis, edema.  Radials/PT 2+ and equal bilaterally.  Respiratory:  Respirations regular and unlabored, clear to auscultation bilaterally. MS: No deformity or atrophy. Skin: Warm and dry, no rash. Neuro:  Strength and sensation are intact. Psych: Normal affect.  Assessment & Plan    HFimpEF Echo in 2018 revealed EF 60 to 65%. Euvolemic and well compensated on exam.  Continue current medication regimen. Low sodium diet, fluid restriction <2L, and daily weights encouraged. Educated to contact our office for weight gain of 2 lbs overnight or 5 lbs in one week. Heart healthy diet and regular cardiovascular exercise encouraged.   2. Presumed CAD Stable with no anginal symptoms. No indication for ischemic evaluation.  Will refill his cardiac medications per his request.  No medication changes at this time. Heart healthy diet and regular cardiovascular exercise encouraged.   3. 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  Followed by oncology and has repeat CT scan arranged for July 2024.  Care precautions discussed.  No medication changes at this time. Heart healthy diet and regular cardiovascular exercise encouraged.   4. HLD No recent labs on file.  He defers this to PCP.  Will fax [place] results when these are available.  No medication changes at this time. Heart healthy diet and regular cardiovascular exercise encouraged.   5. HTN BP borderline elevated in office today.  BP well-controlled at home. Discussed to monitor BP at home at least 2 hours after medications and sitting for 5-10 minutes.  Continue amlodipine, carvedilol, and lisinopril. Discussed if SBP > 140 consistently, to contact office and medication will be adjusted. Heart healthy diet and regular cardiovascular exercise encouraged.     Disposition: Follow up in 1 year(s) with Carlyle Dolly, MD or APP.  Signed, Finis Bud, NP 11/17/2022, 1:55 PM [facility]

## 2022-11-30 DIAGNOSIS — Z6829 Body mass index (BMI) 29.0-29.9, adult: Secondary | ICD-10-CM | POA: Diagnosis not present

## 2022-11-30 DIAGNOSIS — Z902 Acquired absence of lung [part of]: Secondary | ICD-10-CM | POA: Diagnosis not present

## 2022-11-30 DIAGNOSIS — I10 Essential (primary) hypertension: Secondary | ICD-10-CM | POA: Diagnosis not present

## 2022-11-30 DIAGNOSIS — Z125 Encounter for screening for malignant neoplasm of prostate: Secondary | ICD-10-CM | POA: Diagnosis not present

## 2022-11-30 DIAGNOSIS — R030 Elevated blood-pressure reading, without diagnosis of hypertension: Secondary | ICD-10-CM | POA: Diagnosis not present

## 2022-11-30 DIAGNOSIS — J449 Chronic obstructive pulmonary disease, unspecified: Secondary | ICD-10-CM | POA: Diagnosis not present

## 2022-11-30 DIAGNOSIS — N183 Chronic kidney disease, stage 3 unspecified: Secondary | ICD-10-CM | POA: Diagnosis not present

## 2022-11-30 DIAGNOSIS — E7849 Other hyperlipidemia: Secondary | ICD-10-CM | POA: Diagnosis not present

## 2022-11-30 DIAGNOSIS — R739 Hyperglycemia, unspecified: Secondary | ICD-10-CM | POA: Diagnosis not present

## 2023-01-27 DIAGNOSIS — Z1212 Encounter for screening for malignant neoplasm of rectum: Secondary | ICD-10-CM | POA: Diagnosis not present

## 2023-01-27 DIAGNOSIS — Z1211 Encounter for screening for malignant neoplasm of colon: Secondary | ICD-10-CM | POA: Diagnosis not present

## 2023-03-16 ENCOUNTER — Ambulatory Visit (HOSPITAL_COMMUNITY)
Admission: RE | Admit: 2023-03-16 | Discharge: 2023-03-16 | Disposition: A | Discharge status: Home / Self Care | Payer: PPO | Source: Ambulatory Visit | Attending: Hematology | Admitting: Hematology

## 2023-03-16 ENCOUNTER — Inpatient Hospital Stay: Payer: PPO | Attending: Hematology

## 2023-03-16 DIAGNOSIS — C3491 Malignant neoplasm of unspecified part of right bronchus or lung: Secondary | ICD-10-CM | POA: Insufficient documentation

## 2023-03-16 DIAGNOSIS — I7121 Aneurysm of the ascending aorta, without rupture: Secondary | ICD-10-CM | POA: Insufficient documentation

## 2023-03-16 DIAGNOSIS — C3411 Malignant neoplasm of upper lobe, right bronchus or lung: Secondary | ICD-10-CM | POA: Diagnosis present

## 2023-03-16 DIAGNOSIS — J189 Pneumonia, unspecified organism: Secondary | ICD-10-CM | POA: Insufficient documentation

## 2023-03-16 LAB — CBC WITH DIFFERENTIAL/PLATELET
Abs Immature Granulocytes: 0.02 10*3/uL (ref 0.00–0.07)
Basophils Absolute: 0.1 10*3/uL (ref 0.0–0.1)
Basophils Relative: 1 %
Eosinophils Absolute: 0.3 10*3/uL (ref 0.0–0.5)
Eosinophils Relative: 4 %
HCT: 43.5 % (ref 39.0–52.0)
Hemoglobin: 15.5 g/dL (ref 13.0–17.0)
Immature Granulocytes: 0 %
Lymphocytes Relative: 27 %
Lymphs Abs: 1.9 10*3/uL (ref 0.7–4.0)
MCH: 35.2 pg — ABNORMAL HIGH (ref 26.0–34.0)
MCHC: 35.6 g/dL (ref 30.0–36.0)
MCV: 98.9 fL (ref 80.0–100.0)
Monocytes Absolute: 0.7 10*3/uL (ref 0.1–1.0)
Monocytes Relative: 10 %
Neutro Abs: 4.1 10*3/uL (ref 1.7–7.7)
Neutrophils Relative %: 58 %
Platelets: 190 10*3/uL (ref 150–400)
RBC: 4.4 MIL/uL (ref 4.22–5.81)
RDW: 12.6 % (ref 11.5–15.5)
WBC: 6.9 10*3/uL (ref 4.0–10.5)
nRBC: 0 % (ref 0.0–0.2)

## 2023-03-16 LAB — COMPREHENSIVE METABOLIC PANEL
ALT: 16 U/L (ref 0–44)
AST: 19 U/L (ref 15–41)
Albumin: 3.8 g/dL (ref 3.5–5.0)
Alkaline Phosphatase: 65 U/L (ref 38–126)
Anion gap: 7 (ref 5–15)
BUN: 19 mg/dL (ref 8–23)
CO2: 24 mmol/L (ref 22–32)
Calcium: 8.9 mg/dL (ref 8.9–10.3)
Chloride: 104 mmol/L (ref 98–111)
Creatinine, Ser: 1.49 mg/dL — ABNORMAL HIGH (ref 0.61–1.24)
GFR, Estimated: 50 mL/min — ABNORMAL LOW (ref >60)
Glucose, Bld: 93 mg/dL (ref 70–99)
Potassium: 4 mmol/L (ref 3.5–5.1)
Sodium: 135 mmol/L (ref 135–145)
Total Bilirubin: 0.7 mg/dL (ref 0.3–1.2)
Total Protein: 7.6 g/dL (ref 6.5–8.1)

## 2023-03-22 NOTE — Progress Notes (Signed)
Nicolas Butler [address], [place] [address]    Clinic Day:  03/23/2023  Referring physician: Lovey Newcomer, PA  Patient Care Team: Lovey Newcomer, [place] as PCP - General Branch, Dorothe Pea, MD as PCP - Cardiology (Cardiology) Therese Sarah, RN as Oncology Nurse Navigator (Oncology) [name], MD as Medical Oncologist (Medical Oncology) Sharlene Dory, NP as Nurse Practitioner (Cardiology)   ASSESSMENT & PLAN:   Assessment: 1.  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  Anteromedial right upper lobe distal airway enlargement appears FDG avid with SUV of 13.6 concerning for endobronchial spread of tumor.  No signs of FDG avid mediastinal or hilar adenopathy or distant metastatic disease.  Multiple tiny peripheral nodules identified within the right upper lobe.  Bilateral adrenal gland adenomas. -Bronchoscopy and biopsy on 07/02/2020 showed poorly differentiated squamous cell carcinoma. -Right upper lobectomy and lymph node dissection by Dr. Dorris Fetch on 07/22/2020. -Pathology showed 5.1 cm moderately differentiated squamous cell carcinoma involving right upper lobe bronchus, visceral pleura not involved.  Bronchovascular resection margins are negative.  Negative lymphatic or vascular invasion.  Lymph nodes at level 7, levels 9, 11, 12 are negative for carcinoma.  pT3 PN0.0/16 lymph nodes involved. - PD-L1 0% - Adjuvant chemotherapy was recommended but declined by the patient.   2.  Social/family history: -He lives at home by himself and is independent of all ADLs and IADLs. -He is accompanied by his daughter who works as a Engineer, civil (consulting) in cardiovascular surgery department. -He worked in [employer] and quit smoking in 04-06-2013. -Brother died of metastatic cancer.  Maternal cousin had "bone  cancer".  Plan: 1.  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 - No recent respiratory infections. - Labs from 03/16/2023: Creatinine 1.49 stable.  CBC normal.  LFTs normal. - CT chest (03/16/2023): 2 posterior right lobe pulmonary nodules measure 7 and 8 mm, previously 6 mm.  No new lesions seen. - Recommend follow-up in 6 months with repeat CT scan without contrast for follow-up of lung nodules.  Orders Placed This Encounter  Procedures   CT Chest Wo Contrast    Standing Status:   Future    Standing Expiration Date:   03/22/2024    Order Specific Question:   Preferred imaging location?    Answer:   [facility]    Order Specific Question:   Release to patient    Answer:   Immediate [1]   CBC with Differential/Platelet    Standing Status:   Future    Standing Expiration Date:   03/22/2024    Order Specific Question:   Release to patient    Answer:   Immediate   Comprehensive metabolic panel    Standing Status:   Future    Standing Expiration Date:   03/22/2024    Order Specific Question:   Release to patient    Answer:   Immediate      I,Nicolas Butler,acting as a scribe for [name], MD.,have documented all relevant documentation on the behalf of [name], MD,as directed by  [name], MD while in the presence of [name], MD.   I, [name] MD, have reviewed the above documentation for accuracy and completeness, and I agree  with the above.   [name], MD   7/16/20244:15 PM  CHIEF COMPLAINT:   Diagnosis: right squamous cell lung carcinoma    Cancer Staging  Malignant neoplasm of upper lobe of right lung [facility]) Staging form: Lung, AJCC 8th Edition - Clinical stage from 07/11/2020: Stage IA3 (cT1c, cN0, cM0) - Unsigned - Pathologic stage from 07/25/2020: Stage IIB (pT3, pN0, cM0) - Signed by [name],  MD on 07/25/2020    Prior Therapy: Right upper lobectomy and lymph node dissection on 07/22/2020   NGS Results: Foundation 1 MS-stable, TMB 19 Muts/Mb, PD-L1 TPS 0%  Current Therapy:  surveillance   HISTORY OF PRESENT ILLNESS:   Oncology History  Malignant neoplasm of upper lobe of right lung (HCC)  07/11/2020 Initial Diagnosis   Malignant neoplasm of upper lobe of right lung (HCC)   07/25/2020 Cancer Staging   Staging form: Lung, AJCC 8th Edition - Pathologic stage from 07/25/2020: Stage IIB (pT3, pN0, cM0) - Signed by [name], MD on 07/25/2020   09/20/2020 Genetic Testing   PD-L1 Results:     09/23/2020 - 09/23/2020 Chemotherapy   Patient is on Treatment Plan : LUNG Carboplatin + Paclitaxel q21d     09/26/2020 Genetic Testing   Foundation One Results:        INTERVAL HISTORY:   Nicolas Butler is a 72 y.o. male presenting to clinic today for follow up of right squamous cell lung carcinoma. He was last seen by me on 03/12/22.  He underwent a CT of the chest w contrast on 03/16/23 s/p right upper lobectomy that found two posterior bilateral lower lobe pulmonary nodules measuring 7-8 mm previously 6 mm, mild infection/pneumonia in the right middle and lower lobes, and an unchanged 4.2 cm ascending thoracic aortic aneurysm.   His most recent CBC on 03/16/23 showed abnormal MCH at 35.2. His most recent CMP on 03/16/23 showed abnormal creatinine at 1.49 and GFR at 50.   Today, he states that he is doing well overall. His appetite level is at 80%. His energy level is at 80%.  PAST MEDICAL HISTORY:   Past Medical History: Past Medical History:  Diagnosis Date   AAA (abdominal aortic aneurysm) (HCC)    CHF (congestive heart failure) (HCC)    Chronic kidney disease (CKD), stage III (moderate) (HCC)    Complication of anesthesia    Bowel and bladder are slow to wake up after surgery   COPD (chronic obstructive pulmonary disease) (HCC)    Coronary artery disease    presumed    Dyspnea    exertion   High cholesterol    Lung cancer (HCC)    Unspecified essential hypertension     Surgical History: Past Surgical History:  Procedure Laterality Date   ABDOMINAL AORTIC ANEURYSM REPAIR N/A 06/28/2017   Procedure: ANEURYSM ABDOMINAL AORTIC REPAIR OPEN;  Surgeon: Sherren Kerns, MD;  Location: [facility] OR;  Service: Vascular;  Laterality: N/A;   BRONCHIAL BRUSHINGS  07/02/2020   Procedure: BRONCHIAL BRUSHINGS;  Surgeon: Charlott Holler, MD;  Location: [facility];  Service: Pulmonary;;   BRONCHIAL WASHINGS  07/02/2020   Procedure: BRONCHIAL WASHINGS;  Surgeon: Charlott Holler, MD;  Location: [facility];  Service: Pulmonary;;  BAL and washings   HAND SURGERY Left    HERNIA REPAIR     INCISIONAL HERNIA REPAIR     LEFT   INTERCOSTAL NERVE BLOCK Right 07/22/2020   Procedure: INTERCOSTAL NERVE BLOCK;  Surgeon: [name], MD;  Location: [facility] OR;  Service: Thoracic;  Laterality: Right;   IRRIGATION AND DEBRIDEMENT SEBACEOUS CYST     FROM RIGHT SHOULDER   LUNG BIOPSY  07/02/2020   Procedure: LUNG BIOPSY;  Surgeon: Charlott Holler, MD;  Location: [facility];  Service: Pulmonary;;   NODE DISSECTION Right 07/22/2020   Procedure: NODE DISSECTION;  Surgeon: [name], MD;  Location: [facility] OR;  Service: Thoracic;  Laterality: Right;   UMBILICAL HERNIA REPAIR     VIDEO BRONCHOSCOPY Right 07/02/2020   Procedure: VIDEO BRONCHOSCOPY WITHOUT FLUORO;  Surgeon: Charlott Holler, MD;  Location: [facility];  Service: Pulmonary;  Laterality: Right;    Social History: Social History   Socioeconomic History   Marital status: Legally Separated    Spouse name: Not on file   Number of children: 2   Years of education: Not on file   Highest education level: Not on file  Occupational History   Occupation: retired  Tobacco Use   Smoking status: Former    Current packs/day: 0.00    Average packs/day: 1.5 packs/day for 47.6 years (71.3 ttl pk-yrs)    Types:  Cigarettes    Start date: 05/17/1965    Quit date: 12/08/2012    Years since quitting: 10.2   Smokeless tobacco: Never  Vaping Use   Vaping status: Never Used  Substance and Sexual Activity   Alcohol use: No    Alcohol/week: 0.0 standard drinks of alcohol   Drug use: No   Sexual activity: Not Currently  Other Topics Concern   Not on file  Social History Narrative   Not on file   Social Determinants of Health   Financial Resource Strain: Low Risk  (09/02/2020)   Overall Financial Resource Strain (CARDIA)    Difficulty of Paying Living Expenses: Not hard at all  Food Insecurity: No Food Insecurity (09/02/2020)   Hunger Vital Sign    Worried About Running Out of Food in the Last Year: Never true    Ran Out of Food in the Last Year: Never true  Transportation Needs: No Transportation Needs (09/02/2020)   PRAPARE - Administrator, Civil Service (Medical): No    Lack of Transportation (Non-Medical): No  Physical Activity: Insufficiently Active (09/02/2020)   Exercise Vital Sign    Days of Exercise per Week: 1 day    Minutes of Exercise per Session: 20 min  Stress: No Stress Concern Present (09/02/2020)   [employer] of Occupational Health - Occupational Stress Questionnaire    Feeling of Stress : Not at all  Social Connections: Moderately Isolated (09/02/2020)   Social Connection and Isolation Panel [NHANES]    Frequency of Communication with Friends and Family: Three times a week    Frequency of Social Gatherings with Friends and Family: Twice a week    Attends Religious Services: More than 4 times per year    Active Member of [employer] or Organizations: No    Attends Banker Meetings: Never    Marital Status: Separated  Intimate Partner Violence: Not At Risk (09/02/2020)   Humiliation, Afraid, Rape, and Kick questionnaire    Fear of Current or Ex-Partner: No    Emotionally Abused: No    Physically Abused: No    Sexually Abused: No    Family  History: Family History  Problem Relation Age of Onset   Pneumonia Mother    Other Father        brain tumor   Diabetes Son    Lung cancer Neg Hx  Current Medications:  Current Outpatient Medications:    albuterol (PROVENTIL) (2.5 MG/3ML) 0.083% nebulizer solution, Take 2.5 mg by nebulization every 6 (six) hours as needed for wheezing or shortness of breath. , Disp: , Rfl:    albuterol (VENTOLIN HFA) 108 (90 Base) MCG/ACT inhaler, INHALE 2 PUFFS INTO THE LUNGS EVERY 6 HOURS AS NEEDED, Disp: 18 each, Rfl: 5   amLODipine (NORVASC) 5 MG tablet, Take 1 tablet (5 mg total) by mouth daily., Disp: 90 tablet, Rfl: 3   aspirin EC 81 MG tablet, Take 1 tablet (81 mg total) by mouth daily. Swallow whole., Disp: 90 tablet, Rfl: 3   aspirin EC 81 MG tablet, Take 1 tablet (81 mg total) by mouth daily. Swallow whole., Disp: 90 tablet, Rfl: 3   atorvastatin (LIPITOR) 80 MG tablet, Take 1 tablet (80 mg total) by mouth every other day. AT NIGHT, Disp: 45 tablet, Rfl: 3   Budeson-Glycopyrrol-Formoterol (BREZTRI AEROSPHERE) 160-9-4.8 MCG/ACT AERO, Inhale 2 puffs into the lungs in the morning and at bedtime., Disp: 10.7 each, Rfl: 11   carvedilol (COREG) 3.125 MG tablet, Take 1 tablet (3.125 mg total) by mouth 2 (two) times daily., Disp: 180 tablet, Rfl: 3   Cholecalciferol (VITAMIN D3) 5000 units CAPS, Take 10,000 Units by mouth daily after breakfast. , Disp: , Rfl:    lisinopril (ZESTRIL) 40 MG tablet, Take 1 tablet (40 mg total) by mouth daily., Disp: 90 tablet, Rfl: 3   Allergies: Allergies  Allergen Reactions   Hydrochlorothiazide Other (See Comments)    Pt states "it made my legs hurt"   Pravastatin Other (See Comments)    Muscle pain    REVIEW OF SYSTEMS:   Review of Systems  Constitutional:  Negative for chills, fatigue and fever.  HENT:   Negative for lump/mass, mouth sores, nosebleeds, sore throat and trouble swallowing.   Eyes:  Negative for eye problems.  Respiratory:  Positive for  cough and shortness of breath.   Cardiovascular:  Negative for chest pain, leg swelling and palpitations.  Gastrointestinal:  Negative for abdominal pain, constipation, diarrhea, nausea and vomiting.  Genitourinary:  Negative for bladder incontinence, difficulty urinating, dysuria, frequency, hematuria and nocturia.   Musculoskeletal:  Negative for arthralgias, back pain, flank pain, myalgias and neck pain.  Skin:  Negative for itching and rash.  Neurological:  Negative for dizziness, headaches and numbness.  Hematological:  Does not bruise/bleed easily.  Psychiatric/Behavioral:  Negative for depression, sleep disturbance and suicidal ideas. The patient is not nervous/anxious.   All other systems reviewed and are negative.    VITALS:   Blood pressure (!) 151/89, pulse (!) 55, temperature 98.3 F (36.8 C), temperature source Tympanic, resp. rate 16, weight 197 lb 15.6 oz (89.8 kg), SpO2 96%.  Wt Readings from Last 3 Encounters:  03/23/23 197 lb 15.6 oz (89.8 kg)  11/17/22 199 lb (90.3 kg)  09/22/22 190 lb (86.2 kg)    Body mass index is 29.24 kg/m.  Performance status (ECOG): 1 - Symptomatic but completely ambulatory  PHYSICAL EXAM:   Physical Exam Vitals and nursing note reviewed. Exam conducted with a chaperone present.  Constitutional:      Appearance: Normal appearance.  Cardiovascular:     Rate and Rhythm: Normal rate and regular rhythm.     Pulses: Normal pulses.     Heart sounds: Normal heart sounds.  Pulmonary:     Effort: Pulmonary effort is normal.     Breath sounds: Normal breath sounds.  Abdominal:  Palpations: Abdomen is soft. There is no hepatomegaly, splenomegaly or mass.     Tenderness: There is no abdominal tenderness.  Musculoskeletal:     Right lower leg: No edema.     Left lower leg: No edema.  Lymphadenopathy:     Cervical: No cervical adenopathy.     Right cervical: No superficial, deep or posterior cervical adenopathy.    Left cervical: No  superficial, deep or posterior cervical adenopathy.     Upper Body:     Right upper body: No supraclavicular or axillary adenopathy.     Left upper body: No supraclavicular or axillary adenopathy.  Neurological:     General: No focal deficit present.     Mental Status: He is alert and oriented to person, place, and time.  Psychiatric:        Mood and Affect: Mood normal.        Behavior: Behavior normal.     LABS:      Latest Ref Rng & Units 03/16/2023   12:44 PM 09/14/2022    8:00 AM 03/05/2022    8:01 AM  CBC  WBC 4.0 - 10.5 K/uL 6.9  5.7  6.5   Hemoglobin 13.0 - 17.0 g/dL 25.9  56.3  87.5   Hematocrit 39.0 - 52.0 % 43.5  44.7  41.5   Platelets 150 - 400 K/uL 190  168  178       Latest Ref Rng & Units 03/16/2023   12:44 PM 09/14/2022    8:00 AM 06/04/2022   11:40 AM  CMP  Glucose 70 - 99 mg/dL 93  643    BUN 8 - 23 mg/dL 19  16    Creatinine 3.29 - 1.24 mg/dL 5.18  8.41  6.60   Sodium 135 - 145 mmol/L 135  133    Potassium 3.5 - 5.1 mmol/L 4.0  3.9    Chloride 98 - 111 mmol/L 104  100    CO2 22 - 32 mmol/L 24  26    Calcium 8.9 - 10.3 mg/dL 8.9  8.9    Total Protein 6.5 - 8.1 g/dL 7.6  7.3    Total Bilirubin 0.3 - 1.2 mg/dL 0.7  0.9    Alkaline Phos 38 - 126 U/L 65  76    AST 15 - 41 U/L 19  19    ALT 0 - 44 U/L 16  22       No results found for: "CEA1", "CEA" / No results found for: "CEA1", "CEA" No results found for: "PSA1" No results found for: "YTK160" No results found for: "CAN125"  No results found for: "TOTALPROTELP", "ALBUMINELP", "A1GS", "A2GS", "BETS", "BETA2SER", "GAMS", "MSPIKE", "SPEI" No results found for: "TIBC", "FERRITIN", "IRONPCTSAT" No results found for: "LDH"   STUDIES:   CT Chest Wo Contrast  Result Date: 03/22/2023 CLINICAL DATA:  Follow-up right upper lobe squamous cell carcinoma EXAM: CT CHEST WITHOUT CONTRAST TECHNIQUE: Multidetector CT imaging of the chest was performed following the standard protocol without IV contrast. RADIATION DOSE  REDUCTION: This exam was performed according to the departmental dose-optimization program which includes automated exposure control, adjustment of the mA and/or kV according to patient size and/or use of iterative reconstruction technique. COMPARISON:  09/14/2022 FINDINGS: Cardiovascular: Heart is normal in size.  No pericardial effusion. 4.2 cm ascending thoracic aortic aneurysm, unchanged. Atherosclerotic calcifications of the aortic arch. Moderate three-vessel coronary atherosclerosis. Mediastinum/Nodes: Small mediastinal lymph nodes, including a dominant 8 mm short axis low right paratracheal node. Lungs/Pleura:  Status post right upper lobectomy. Mild centrilobular emphysematous changes, upper lung predominant. Two posterior bilateral lobe pulmonary nodules measuring 7 8 mm (series 4/images 67 and 70), previously 6 mm. Mild patchy opacity inferiorly in the right middle lobe (series 4/image 97) with peribronchovascular nodularity at the right lung base (series 4/image 117), reflecting mild infection/pneumonia. No pleural effusion or pneumothorax. Upper Abdomen: Visualized upper abdomen is notable for atherosclerotic calcifications of the aortic arch and branch vessels and bilateral adrenal adenomas (measuring less than 5 HUs) measuring up to 2.0 cm. Musculoskeletal: Degenerative changes of the visualized thoracolumbar spine. IMPRESSION: Status post right upper lobectomy. Two posterior bilateral lower lobe pulmonary nodules measuring 7-8 mm, previously 6 mm. Follow-up CT chest is suggested in 6 months. Mild infection/pneumonia in the right middle and lower lobes, as above. 4.2 cm ascending thoracic aortic aneurysm, unchanged. Recommend annual imaging followup by CTA or MRA. This recommendation follows 2010 ACCF/AHA/AATS/ACR/ASA/SCA/SCAI/SIR/STS/SVM Guidelines for the Diagnosis and Management of Patients with Thoracic Aortic Disease. Circulation. 2010; 121: Z610-R604. Aortic aneurysm NOS (ICD10-I71.9) Aortic  Atherosclerosis (ICD10-I70.0) and Emphysema (ICD10-J43.9). Electronically Signed   By: Charline Bills M.D.   On: 03/22/2023 02:47

## 2023-03-23 ENCOUNTER — Inpatient Hospital Stay: Payer: PPO | Admitting: Hematology

## 2023-03-23 VITALS — BP 151/89 | HR 55 | Temp 98.3°F | Resp 16 | Wt 198.0 lb

## 2023-03-23 DIAGNOSIS — C3491 Malignant neoplasm of unspecified part of right bronchus or lung: Secondary | ICD-10-CM | POA: Diagnosis not present

## 2023-03-23 DIAGNOSIS — C3411 Malignant neoplasm of upper lobe, right bronchus or lung: Secondary | ICD-10-CM | POA: Diagnosis not present

## 2023-03-23 NOTE — Patient Instructions (Signed)
[facility]  Discharge Instructions  You were seen and examined today by Dr. Ellin Saba.  Dr. Ellin Saba discussed your most recent lab work and CT scan which revealed that everything looks stable except two nodules in your lungs have grown by 1 mm.  Dr. Ellin Saba will keep a watch on them with repeat scan and labs before your next appointment.  Follow-up as scheduled in 6 months.    Thank you for choosing [facility] to provide your oncology and hematology care.   To afford each patient quality time with our provider, please arrive at least 15 minutes before your scheduled appointment time. You may need to reschedule your appointment if you arrive late (10 or more minutes). Arriving late affects you and other patients whose appointments are after yours.  Also, if you miss three or more appointments without notifying the office, you may be dismissed from the clinic at the provider's discretion.    Again, thank you for choosing [facility].  Our hope is that these requests will decrease the amount of time that you wait before being seen by our physicians.   If you have a lab appointment with the Cancer Center - please note that after April 8th, all labs will be drawn in the cancer center.  You do not have to check in or register with the main entrance as you have in the past but will complete your check-in at the cancer center.            _____________________________________________________________  Should you have questions after your visit to [facility], please contact our office at [phone] and follow the prompts.  Our office hours are 8:00 a.m. to 4:30 p.m. Monday - Thursday and 8:00 a.m. to 2:30 p.m. Friday.  Please note that voicemails left after 4:00 p.m. may not be returned until the following business day.  We are closed weekends and all major holidays.  You do have access to a nurse 24-7, just  call the main number to the clinic [phone] and do not press any options, hold on the line and a nurse will answer the phone.    For prescription refill requests, have your pharmacy contact our office and allow 72 hours.    Masks are no longer required in the cancer centers. If you would like for your care team to wear a mask while they are taking care of you, please let them know. You may have one support person who is at least 72 years old accompany you for your appointments.

## 2023-03-24 ENCOUNTER — Ambulatory Visit: Payer: PPO | Admitting: Pulmonary Disease

## 2023-03-24 ENCOUNTER — Encounter: Payer: Self-pay | Admitting: Pulmonary Disease

## 2023-03-24 VITALS — BP 146/79 | HR 56 | Ht 69.0 in | Wt 198.0 lb

## 2023-03-24 DIAGNOSIS — J439 Emphysema, unspecified: Secondary | ICD-10-CM | POA: Diagnosis not present

## 2023-03-24 DIAGNOSIS — J4489 Other specified chronic obstructive pulmonary disease: Secondary | ICD-10-CM | POA: Diagnosis not present

## 2023-03-24 DIAGNOSIS — C3411 Malignant neoplasm of upper lobe, right bronchus or lung: Secondary | ICD-10-CM

## 2023-03-24 NOTE — Progress Notes (Signed)
   Subjective:    Patient ID: Nicolas Butler, male    DOB: May 06, 1951, 72 y.o.   MRN: [id number]  HPI  72 yo man with COPD & lung cancer  for follow up   He was diagnosed with Stage IIb squamousCC after bronchoscopy when he presented with a right upper lobe lung mass.and underwent RUL lobectomy with Dr. Dorris Fetch on 07/22/2020.  - declined adjuvant chemo , PD-L1 0% - acute exacerbation in December 2021      PMH - aortobifem graft BL renal arerty stenosis CKD stage III He smoked 70 pYrs before he quit 2014  Chief Complaint  Patient presents with   COPD    COPD with chronic bronchitis and emphysema    7 month FU visit  Dyspnea is worse in the hot weather. He had a dry cough and he has self decreased lisinopril to every third day Reviewed oncology visit and cardiology visit. CT chest without contrast was reviewed, patchy right middle lobe minimal infiltrate, he denies fever, sick contacts or cough  Significant tests/ events reviewed   03/2023 CT chest  wo con >> BLL nodules x 2 8mm, 4.2 cm asc TAA -unchanged , RML infx  05/2022 CT C/A/P >> 4.3 cm asc TAA, 12mm aneurysm of left subclavian, RLL nodule 6 mm x 2 , stable from 12/20222   PFTs 07/2020 -14% postbronchodilator improvement, FEV1 64%, FVC 91%, TLC 133%, DLCO 91/23.1   Review of Systems neg for any significant sore throat, dysphagia, itching, sneezing, nasal congestion or excess/ purulent secretions, fever, chills, sweats, unintended wt loss, pleuritic or exertional cp, hempoptysis, orthopnea pnd or change in chronic leg swelling. Also denies presyncope, palpitations, heartburn, abdominal pain, nausea, vomiting, diarrhea or change in bowel or urinary habits, dysuria,hematuria, rash, arthralgias, visual complaints, headache, numbness weakness or ataxia.     Objective:   Physical Exam  Gen. Pleasant, well-nourished, in no distress ENT - no thrush, no pallor/icterus,no post nasal drip Neck: No JVD, no thyromegaly, no  carotid bruits Lungs: no use of accessory muscles, no dullness to percussion, clear without rales or rhonchi  Cardiovascular: Rhythm regular, heart sounds  normal, no murmurs or gallops, no peripheral edema Musculoskeletal: No deformities, no cyanosis or clubbing        Assessment & Plan:

## 2023-03-24 NOTE — Patient Instructions (Signed)
  CT chest in 6 months  Continue Breztri

## 2023-03-24 NOTE — Assessment & Plan Note (Signed)
Bilateral lower lobe nodules have marginally increased in size from 6 mm to 8 mm.  Will follow-up surveillance with 28-month CT chest without contrast

## 2023-03-24 NOTE — Assessment & Plan Note (Addendum)
Appears stable. Continue Breztri. Use albuterol for rescue. We discussed signs and symptoms of COPD exacerbation and plan for the same He has minimal infiltrate in the right middle lobe but he is asymptomatic, we will hold off on antibiotics.  Will take Mucinex and call us if needed

## 2023-08-17 ENCOUNTER — Telehealth: Payer: Self-pay | Admitting: Pulmonary Disease

## 2023-08-17 NOTE — Telephone Encounter (Signed)
Pt wants to stay in [facility] location, he will like to switch [place]

## 2023-08-21 NOTE — Telephone Encounter (Signed)
Dr. Sherene Sires and Dr. Vassie Loll, please advise if you are fine with provider switch.

## 2023-08-21 NOTE — Telephone Encounter (Signed)
Fine with me but needs 30 min slot, bring all active meds including otcs. Inhalers/ solutions

## 2023-08-23 NOTE — Telephone Encounter (Signed)
Please schedule patient with MW he is switching from [place] to [place]. Per MW: Fine with me but needs 30 min slot, bring all active meds including otcs. Inhalers/ solutions

## 2023-08-23 NOTE — Telephone Encounter (Signed)
Pt has scheduled for 09/20/22.

## 2023-09-19 NOTE — Progress Notes (Deleted)
 Nicolas Butler, male    DOB: 1951/01/10    MRN: [id number]   Brief patient profile:  73  yo*** *** former [facility]  pt self-referred back to pulmonary clinic in [place]  09/21/2023  for ***      History of Present Illness  09/21/2023  Pulmonary/ 1st office eval/ [facility]  No chief complaint on file.    Dyspnea:  *** Cough: *** Sleep: *** SABA use: *** 02 use:*** LDSCT:***  No obvious day to day or daytime pattern/variability or assoc excess/ purulent sputum or mucus plugs or hemoptysis or cp or chest tightness, subjective wheeze or overt sinus or hb symptoms.    Also denies any obvious fluctuation of symptoms with weather or environmental changes or other aggravating or alleviating factors except as outlined above   No unusual exposure hx or h/o childhood pna/ asthma or knowledge of premature birth.  Current Allergies, Complete Past Medical History, Past Surgical History, Family History, and Social History were reviewed in [employer] record.  ROS  The following are not active complaints unless bolded Hoarseness, sore throat, dysphagia, dental problems, itching, sneezing,  nasal congestion or discharge of excess mucus or purulent secretions, ear ache,   fever, chills, sweats, unintended wt loss or wt gain, classically pleuritic or exertional cp,  orthopnea pnd or arm/hand swelling  or leg swelling, presyncope, palpitations, abdominal pain, anorexia, nausea, vomiting, diarrhea  or change in bowel habits or change in bladder habits, change in stools or change in urine, dysuria, hematuria,  rash, arthralgias, visual complaints, headache, numbness, weakness or ataxia or problems with walking or coordination,  change in mood or  memory.            Outpatient Medications Prior to Visit  Medication Sig Dispense Refill   albuterol  (PROVENTIL ) (2.5 MG/3ML) 0.083% nebulizer solution Take 2.5 mg by nebulization every 6 (six) hours as needed for  wheezing or shortness of breath.      albuterol  (VENTOLIN  HFA) 108 (90 Base) MCG/ACT inhaler INHALE 2 PUFFS INTO THE LUNGS EVERY 6 HOURS AS NEEDED 18 each 5   amLODipine  (NORVASC ) 5 MG tablet Take 1 tablet (5 mg total) by mouth daily. 90 tablet 3   aspirin  EC 81 MG tablet Take 1 tablet (81 mg total) by mouth daily. Swallow whole. 90 tablet 3   atorvastatin  (LIPITOR ) 80 MG tablet Take 1 tablet (80 mg total) by mouth every other day. AT NIGHT 45 tablet 3   Budeson-Glycopyrrol-Formoterol (BREZTRI  AEROSPHERE) 160-9-4.8 MCG/ACT AERO Inhale 2 puffs into the lungs in the morning and at bedtime. 10.7 each 11   carvedilol  (COREG ) 3.125 MG tablet Take 1 tablet (3.125 mg total) by mouth 2 (two) times daily. 180 tablet 3   Cholecalciferol (VITAMIN D3) 5000 units CAPS Take 10,000 Units by mouth daily after breakfast.      lisinopril  (ZESTRIL ) 40 MG tablet Take 1 tablet (40 mg total) by mouth daily. 90 tablet 3   No facility-administered medications prior to visit.    Past Medical History:  Diagnosis Date   AAA (abdominal aortic aneurysm) (HCC)    CHF (congestive heart failure) (HCC)    Chronic kidney disease (CKD), stage III (moderate) (HCC)    Complication of anesthesia    Bowel and bladder are slow to wake up after surgery   COPD (chronic obstructive pulmonary disease) (HCC)    Coronary artery disease    presumed   Dyspnea    exertion   High cholesterol  Lung cancer (HCC)    Unspecified essential hypertension       Objective:     There were no vitals taken for this visit.         Assessment   No problem-specific Assessment & Plan notes found for this encounter.     Ozell America, MD 09/19/2023

## 2023-09-21 ENCOUNTER — Encounter: Payer: Self-pay | Admitting: Internal Medicine

## 2023-09-21 ENCOUNTER — Ambulatory Visit: Payer: PPO | Admitting: Internal Medicine

## 2023-09-24 ENCOUNTER — Other Ambulatory Visit: Payer: Self-pay | Admitting: Pulmonary Disease

## 2023-09-24 DIAGNOSIS — J4489 Other specified chronic obstructive pulmonary disease: Secondary | ICD-10-CM

## 2023-09-28 ENCOUNTER — Ambulatory Visit (HOSPITAL_COMMUNITY)
Admission: RE | Admit: 2023-09-28 | Discharge: 2023-09-28 | Disposition: A | Discharge status: Home / Self Care | Payer: PPO | Source: Ambulatory Visit | Attending: Hematology | Admitting: Hematology

## 2023-09-28 ENCOUNTER — Inpatient Hospital Stay: Payer: PPO | Attending: Hematology

## 2023-09-28 DIAGNOSIS — C3491 Malignant neoplasm of unspecified part of right bronchus or lung: Secondary | ICD-10-CM

## 2023-09-28 DIAGNOSIS — Z85118 Personal history of other malignant neoplasm of bronchus and lung: Secondary | ICD-10-CM | POA: Diagnosis not present

## 2023-09-28 DIAGNOSIS — Z08 Encounter for follow-up examination after completed treatment for malignant neoplasm: Secondary | ICD-10-CM | POA: Diagnosis not present

## 2023-09-28 LAB — COMPREHENSIVE METABOLIC PANEL
ALT: 20 U/L (ref 0–44)
AST: 19 U/L (ref 15–41)
Albumin: 4.1 g/dL (ref 3.5–5.0)
Alkaline Phosphatase: 73 U/L (ref 38–126)
Anion gap: 9 (ref 5–15)
BUN: 17 mg/dL (ref 8–23)
CO2: 25 mmol/L (ref 22–32)
Calcium: 9.4 mg/dL (ref 8.9–10.3)
Chloride: 102 mmol/L (ref 98–111)
Creatinine, Ser: 1.28 mg/dL — ABNORMAL HIGH (ref 0.61–1.24)
GFR, Estimated: 59 mL/min — ABNORMAL LOW (ref >60)
Glucose, Bld: 98 mg/dL (ref 70–99)
Potassium: 4.4 mmol/L (ref 3.5–5.1)
Sodium: 136 mmol/L (ref 135–145)
Total Bilirubin: 0.8 mg/dL (ref 0.0–1.2)
Total Protein: 7.7 g/dL (ref 6.5–8.1)

## 2023-09-28 LAB — CBC WITH DIFFERENTIAL/PLATELET
Abs Immature Granulocytes: 0.01 10*3/uL (ref 0.00–0.07)
Basophils Absolute: 0.1 10*3/uL (ref 0.0–0.1)
Basophils Relative: 1 %
Eosinophils Absolute: 0.3 10*3/uL (ref 0.0–0.5)
Eosinophils Relative: 5 %
HCT: 46.3 % (ref 39.0–52.0)
Hemoglobin: 16 g/dL (ref 13.0–17.0)
Immature Granulocytes: 0 %
Lymphocytes Relative: 23 %
Lymphs Abs: 1.5 10*3/uL (ref 0.7–4.0)
MCH: 34.1 pg — ABNORMAL HIGH (ref 26.0–34.0)
MCHC: 34.6 g/dL (ref 30.0–36.0)
MCV: 98.7 fL (ref 80.0–100.0)
Monocytes Absolute: 0.5 10*3/uL (ref 0.1–1.0)
Monocytes Relative: 7 %
Neutro Abs: 4.3 10*3/uL (ref 1.7–7.7)
Neutrophils Relative %: 64 %
Platelets: 182 10*3/uL (ref 150–400)
RBC: 4.69 MIL/uL (ref 4.22–5.81)
RDW: 12.7 % (ref 11.5–15.5)
WBC: 6.6 10*3/uL (ref 4.0–10.5)
nRBC: 0 % (ref 0.0–0.2)

## 2023-10-04 NOTE — Progress Notes (Signed)
[facility] [address], [place] [address]    Clinic Day:  10/05/23   Referring physician: Lovey Newcomer, PA  Patient Care Team: Lovey Newcomer, [place] as PCP - General [place], Dorothe Pea, MD as PCP - Cardiology (Cardiology) Therese Sarah, RN as Oncology Nurse Navigator (Oncology) [name], MD as Medical Oncologist (Medical Oncology) Sharlene Dory, NP as Nurse Practitioner (Cardiology)   ASSESSMENT & PLAN:   Assessment: 1.  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  Anteromedial right upper lobe distal airway enlargement appears FDG avid with SUV of 13.6 concerning for endobronchial spread of tumor.  No signs of FDG avid mediastinal or hilar adenopathy or distant metastatic disease.  Multiple tiny peripheral nodules identified within the right upper lobe.  Bilateral adrenal gland adenomas. -Bronchoscopy and biopsy on 07/02/2020 showed poorly differentiated squamous cell carcinoma. -Right upper lobectomy and lymph node dissection by Dr. Dorris Fetch on 07/22/2020. -Pathology showed 5.1 cm moderately differentiated squamous cell carcinoma involving right upper lobe bronchus, visceral pleura not involved.  Bronchovascular resection margins are negative.  Negative lymphatic or vascular invasion.  Lymph nodes at level 7, levels 9, 11, 12 are negative for carcinoma.  pT3 PN0.0/16 lymph nodes involved. - PD-L1 0% - Adjuvant chemotherapy was recommended but declined by the patient.   2.  Social/family history: -He lives at home by himself and is independent of all ADLs and IADLs. -He is accompanied by his daughter who works as a Engineer, civil (consulting) in cardiovascular surgery department. -He worked in [employer] and quit smoking in 11/09/2012. -Brother died of metastatic cancer.  Maternal cousin had "bone  cancer".  Plan: 1.  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  No respiratory infections reported. - Labs today: Normal LFTs.  Creatinine mildly elevated at 1.25 consistent with his CKD.  CBC was normal. - CT chest (09/27/2022): 5 mm right lower lobe lung nodule stable.  No evidence of disease progression or recurrence.  Other benign findings were discussed with the patient. - Recommend follow-up in 6 months with repeat labs and CT scan without contrast.  Orders Placed This Encounter  Procedures   CT Chest Wo Contrast    Standing Status:   Future    Expected Date:   04/03/2024    Expiration Date:   10/04/2024    Preferred imaging location?:   [facility]   CBC with Differential    Standing Status:   Future    Expected Date:   03/27/2024    Expiration Date:   10/04/2024   Comprehensive metabolic panel    Standing Status:   Future    Expected Date:   03/27/2024    Expiration Date:   10/04/2024      Mikeal Hawthorne R Teague,acting as a scribe for [name], MD.,have documented all relevant documentation on the behalf of [name], MD,as directed by  [name], MD while in the presence of [name], MD.  I, [name] MD, have reviewed the above documentation for accuracy and completeness, and I agree with the above.    [name], MD   1/28/20255:54 PM  CHIEF COMPLAINT:   Diagnosis: right squamous cell lung carcinoma    Cancer Staging  Malignant neoplasm of upper  lobe of right lung (HCC) Staging form: Lung, AJCC 8th Edition - Clinical stage from 07/11/2020: Stage IA3 (cT1c, cN0, cM0) - Unsigned - Pathologic stage from 07/25/2020: Stage IIB (pT3, pN0, cM0) - Signed by [name], MD on 07/25/2020    Prior Therapy: Right upper lobectomy and lymph node dissection on 07/22/2020   NGS Results: Foundation 1 MS-stable,  TMB 19 Muts/Mb, PD-L1 TPS 0%  Current Therapy:  surveillance   HISTORY OF PRESENT ILLNESS:   Oncology History  Malignant neoplasm of upper lobe of right lung (HCC)  07/11/2020 Initial Diagnosis   Malignant neoplasm of upper lobe of right lung (HCC)   07/25/2020 Cancer Staging   Staging form: Lung, AJCC 8th Edition - Pathologic stage from 07/25/2020: Stage IIB (pT3, pN0, cM0) - Signed by [name], MD on 07/25/2020   09/20/2020 Genetic Testing   PD-L1 Results:     09/23/2020 - 09/23/2020 Chemotherapy   Patient is on Treatment Plan : LUNG Carboplatin + Paclitaxel q21d     09/26/2020 Genetic Testing   Foundation One Results:        INTERVAL HISTORY:   Nicolas Butler is a 73 y.o. male presenting to clinic today for follow up of right squamous cell lung carcinoma. He was last seen by me on 03/23/23.  Since his last visit, he underwent CT chest on 09/28/23 that found: no evidence of disease progression; 5 mm right lower lobe nodules, stable; 4.4 cm ascending aortic aneurysm; bilateral adrenal adenomas; and enlarged pulmonic trunk, indicative of pulmonary arterial hypertension.  Today, he states that he is doing well overall. His appetite level is at 100%. His energy level is at 100%. He denies any infections in the last 6 months. He reports he is coughing less often he attributes to taking sea moss, recommended to him by his son. Sea moss reportedly improved constipation.   PAST MEDICAL HISTORY:   Past Medical History: Past Medical History:  Diagnosis Date   AAA (abdominal aortic aneurysm) (HCC)    CHF (congestive heart failure) (HCC)    Chronic kidney disease (CKD), stage III (moderate) (HCC)    Complication of anesthesia    Bowel and bladder are slow to wake up after surgery   COPD (chronic obstructive pulmonary disease) (HCC)    Coronary artery disease    presumed   Dyspnea    exertion   High cholesterol    Lung cancer (HCC)    Unspecified essential hypertension      Surgical History: Past Surgical History:  Procedure Laterality Date   ABDOMINAL AORTIC ANEURYSM REPAIR N/A 06/28/2017   Procedure: ANEURYSM ABDOMINAL AORTIC REPAIR OPEN;  Surgeon: Sherren Kerns, MD;  Location: [facility] OR;  Service: Vascular;  Laterality: N/A;   BRONCHIAL BRUSHINGS  07/02/2020   Procedure: BRONCHIAL BRUSHINGS;  Surgeon: Charlott Holler, MD;  Location: [facility];  Service: Pulmonary;;   BRONCHIAL WASHINGS  07/02/2020   Procedure: BRONCHIAL WASHINGS;  Surgeon: Charlott Holler, MD;  Location: [facility];  Service: Pulmonary;;  BAL and washings   HAND SURGERY Left    HERNIA REPAIR     INCISIONAL HERNIA REPAIR     LEFT   INTERCOSTAL NERVE BLOCK Right 07/22/2020   Procedure: INTERCOSTAL NERVE BLOCK;  Surgeon: [name], MD;  Location: [facility] OR;  Service: Thoracic;  Laterality: Right;   IRRIGATION AND DEBRIDEMENT SEBACEOUS CYST     FROM RIGHT SHOULDER   LUNG BIOPSY  07/02/2020   Procedure: LUNG BIOPSY;  Surgeon: Charlott Holler,  MD;  Location: [facility];  Service: Pulmonary;;   NODE DISSECTION Right 07/22/2020   Procedure: NODE DISSECTION;  Surgeon: [name], MD;  Location: [facility] OR;  Service: Thoracic;  Laterality: Right;   UMBILICAL HERNIA REPAIR     VIDEO BRONCHOSCOPY Right 07/02/2020   Procedure: VIDEO BRONCHOSCOPY WITHOUT FLUORO;  Surgeon: Charlott Holler, MD;  Location: [facility];  Service: Pulmonary;  Laterality: Right;    Social History: Social History   Socioeconomic History   Marital status: Legally Separated    Spouse name: Not on file   Number of children: 2   Years of education: Not on file   Highest education level: Not on file  Occupational History   Occupation: retired  Tobacco Use   Smoking status: Former    Current packs/day: 0.00    Average packs/day: 1.5 packs/day for 47.6 years (71.3 ttl pk-yrs)    Types: Cigarettes    Start date: 05/17/1965    Quit date: 12/08/2012    Years since quitting: 10.8   Smokeless  tobacco: Never  Vaping Use   Vaping status: Never Used  Substance and Sexual Activity   Alcohol use: No    Alcohol/week: 0.0 standard drinks of alcohol   Drug use: No   Sexual activity: Not Currently  Other Topics Concern   Not on file  Social History Narrative   Not on file   Social Drivers of Health   Financial Resource Strain: Low Risk  (09/02/2020)   Overall Financial Resource Strain (CARDIA)    Difficulty of Paying Living Expenses: Not hard at all  Food Insecurity: No Food Insecurity (09/02/2020)   Hunger Vital Sign    Worried About Running Out of Food in the Last Year: Never true    Ran Out of Food in the Last Year: Never true  Transportation Needs: No Transportation Needs (09/02/2020)   PRAPARE - Administrator, Civil Service (Medical): No    Lack of Transportation (Non-Medical): No  Physical Activity: Insufficiently Active (09/02/2020)   Exercise Vital Sign    Days of Exercise per Week: 1 day    Minutes of Exercise per Session: 20 min  Stress: No Stress Concern Present (09/02/2020)   [employer] of Occupational Health - Occupational Stress Questionnaire    Feeling of Stress : Not at all  Social Connections: Moderately Isolated (09/02/2020)   Social Connection and Isolation Panel [NHANES]    Frequency of Communication with Friends and Family: Three times a week    Frequency of Social Gatherings with Friends and Family: Twice a week    Attends Religious Services: More than 4 times per year    Active Member of [employer] or Organizations: No    Attends Banker Meetings: Never    Marital Status: Separated  Intimate Partner Violence: Not At Risk (09/02/2020)   Humiliation, Afraid, Rape, and Kick questionnaire    Fear of Current or Ex-Partner: No    Emotionally Abused: No    Physically Abused: No    Sexually Abused: No    Family History: Family History  Problem Relation Age of Onset   Pneumonia Mother    Other Father        brain tumor    Diabetes Son    Lung cancer Neg Hx     Current Medications:  Current Outpatient Medications:    albuterol (PROVENTIL) (2.5 MG/3ML) 0.083% nebulizer solution, Take 2.5 mg by nebulization every 6 (six) hours as needed for wheezing or shortness  of breath. , Disp: , Rfl:    albuterol (VENTOLIN HFA) 108 (90 Base) MCG/ACT inhaler, INHALE 2 PUFFS INTO THE LUNGS EVERY 6 HOURS AS NEEDED, Disp: 18 each, Rfl: 5   amLODipine (NORVASC) 5 MG tablet, Take 1 tablet (5 mg total) by mouth daily., Disp: 90 tablet, Rfl: 3   aspirin EC 81 MG tablet, Take 1 tablet (81 mg total) by mouth daily. Swallow whole., Disp: 90 tablet, Rfl: 3   atorvastatin (LIPITOR) 80 MG tablet, Take 1 tablet (80 mg total) by mouth every other day. AT NIGHT, Disp: 45 tablet, Rfl: 3   Budeson-Glycopyrrol-Formoterol (BREZTRI AEROSPHERE) 160-9-4.8 MCG/ACT AERO, INHALE 2 PUFFS INTO THE LUNGS IN THE MORNING AND AT BEDTIME., Disp: 10.7 each, Rfl: 1   carvedilol (COREG) 3.125 MG tablet, Take 1 tablet (3.125 mg total) by mouth 2 (two) times daily., Disp: 180 tablet, Rfl: 3   Cholecalciferol (VITAMIN D3) 5000 units CAPS, Take 10,000 Units by mouth daily after breakfast. , Disp: , Rfl:    lisinopril (ZESTRIL) 40 MG tablet, Take 1 tablet (40 mg total) by mouth daily., Disp: 90 tablet, Rfl: 3   Allergies: Allergies  Allergen Reactions   Hydrochlorothiazide Other (See Comments)    Pt states "it made my legs hurt"   Pravastatin Other (See Comments)    Muscle pain    REVIEW OF SYSTEMS:   Review of Systems  Constitutional:  Negative for chills, fatigue and fever.  HENT:   Negative for lump/mass, mouth sores, nosebleeds, sore throat and trouble swallowing.   Eyes:  Negative for eye problems.  Respiratory:  Positive for cough and shortness of breath.   Cardiovascular:  Negative for chest pain, leg swelling and palpitations.  Gastrointestinal:  Negative for abdominal pain, constipation, diarrhea, nausea and vomiting.  Genitourinary:  Negative  for bladder incontinence, difficulty urinating, dysuria, frequency, hematuria and nocturia.   Musculoskeletal:  Negative for arthralgias, back pain, flank pain, myalgias and neck pain.  Skin:  Negative for itching and rash.  Neurological:  Negative for dizziness, headaches and numbness.  Hematological:  Does not bruise/bleed easily.  Psychiatric/Behavioral:  Negative for depression, sleep disturbance and suicidal ideas. The patient is not nervous/anxious.   All other systems reviewed and are negative.    VITALS:   Blood pressure (!) 155/85, pulse 65, temperature 98.2 F (36.8 C), temperature source Oral, resp. rate 16, weight 205 lb 0.4 oz (93 kg), SpO2 95%.  Wt Readings from Last 3 Encounters:  10/05/23 205 lb 0.4 oz (93 kg)  03/24/23 198 lb (89.8 kg)  03/23/23 197 lb 15.6 oz (89.8 kg)    Body mass index is 30.28 kg/m.  Performance status (ECOG): 1 - Symptomatic but completely ambulatory  PHYSICAL EXAM:   Physical Exam Vitals and nursing note reviewed. Exam conducted with a chaperone present.  Constitutional:      Appearance: Normal appearance.  Cardiovascular:     Rate and Rhythm: Normal rate and regular rhythm.     Pulses: Normal pulses.     Heart sounds: Normal heart sounds.  Pulmonary:     Effort: Pulmonary effort is normal.     Breath sounds: Normal breath sounds.  Abdominal:     Palpations: Abdomen is soft. There is no hepatomegaly, splenomegaly or mass.     Tenderness: There is no abdominal tenderness.  Musculoskeletal:     Right lower leg: No edema.     Left lower leg: No edema.  Lymphadenopathy:     Cervical: No cervical adenopathy.  Right cervical: No superficial, deep or posterior cervical adenopathy.    Left cervical: No superficial, deep or posterior cervical adenopathy.     Upper Body:     Right upper body: No supraclavicular or axillary adenopathy.     Left upper body: No supraclavicular or axillary adenopathy.  Neurological:     General: No focal  deficit present.     Mental Status: He is alert and oriented to person, place, and time.  Psychiatric:        Mood and Affect: Mood normal.        Behavior: Behavior normal.     LABS:      Latest Ref Rng & Units 09/28/2023    7:43 AM 03/16/2023   12:44 PM 09/14/2022    8:00 AM  CBC  WBC 4.0 - 10.5 K/uL 6.6  6.9  5.7   Hemoglobin 13.0 - 17.0 g/dL 16.1  09.6  04.5   Hematocrit 39.0 - 52.0 % 46.3  43.5  44.7   Platelets 150 - 400 K/uL 182  190  168       Latest Ref Rng & Units 09/28/2023    7:43 AM 03/16/2023   12:44 PM 09/14/2022    8:00 AM  CMP  Glucose 70 - 99 mg/dL 98  93  409   BUN 8 - 23 mg/dL 17  19  16    Creatinine 0.61 - 1.24 mg/dL 8.11  9.14  7.82   Sodium 135 - 145 mmol/L 136  135  133   Potassium 3.5 - 5.1 mmol/L 4.4  4.0  3.9   Chloride 98 - 111 mmol/L 102  104  100   CO2 22 - 32 mmol/L 25  24  26    Calcium 8.9 - 10.3 mg/dL 9.4  8.9  8.9   Total Protein 6.5 - 8.1 g/dL 7.7  7.6  7.3   Total Bilirubin 0.0 - 1.2 mg/dL 0.8  0.7  0.9   Alkaline Phos 38 - 126 U/L 73  65  76   AST 15 - 41 U/L 19  19  19    ALT 0 - 44 U/L 20  16  22       No results found for: "CEA1", "CEA" / No results found for: "CEA1", "CEA" No results found for: "PSA1" No results found for: "NFA213" No results found for: "CAN125"  No results found for: "TOTALPROTELP", "ALBUMINELP", "A1GS", "A2GS", "BETS", "BETA2SER", "GAMS", "MSPIKE", "SPEI" No results found for: "TIBC", "FERRITIN", "IRONPCTSAT" No results found for: "LDH"   STUDIES:   CT Chest Wo Contrast Result Date: 10/04/2023 CLINICAL DATA:  Lung nodules, lung cancer.  * Tracking Code: BO * EXAM: CT CHEST WITHOUT CONTRAST TECHNIQUE: Multidetector CT imaging of the chest was performed following the standard protocol without IV contrast. RADIATION DOSE REDUCTION: This exam was performed according to the departmental dose-optimization program which includes automated exposure control, adjustment of the mA and/or kV according to patient size and/or  use of iterative reconstruction technique. COMPARISON:  03/16/2023. FINDINGS: Cardiovascular: Atherosclerotic calcification of the aorta and coronary arteries. Enlarged pulmonic trunk. Heart is at the upper limits of normal in size. No pericardial effusion. Ascending aorta measures 4.4 cm (coronal image 52), stable. Mediastinum/Nodes: No pathologically enlarged mediastinal or axillary lymph nodes. Hilar regions are difficult to definitively evaluate without IV contrast. Esophagus is grossly unremarkable. Lungs/Pleura: Right upper lobectomy. Volume loss in the right middle lobe. Additional scattered pleuroparenchymal scarring in the right lung. Posterior right lower lobe nodules measure up to  5 mm (3/70), stable. Mild centrilobular and paraseptal emphysema. 4 mm juxtapleural nodule in the lateral left lower lobe, unchanged and considered benign. No pleural fluid. There may be a tiny right posterolateral tracheal diverticulum. Minimal debris in the airway. Upper Abdomen: 2.0 cm right adrenal nodule measures -4 Hounsfield units and 2.4 cm left adrenal nodule, 5 Hounsfield units. No specific follow-up necessary. Subcentimeter low-attenuation lesion in the left kidney, too small to characterize. No specific follow-up necessary. Tiny hiatal hernia. Visualized portions of the liver, gallbladder, adrenal glands, kidneys, spleen, pancreas, stomach and bowel are otherwise grossly unremarkable. No upper abdominal adenopathy. Small midline ventral hernias contain fat. Musculoskeletal: Degenerative changes in the spine. Mild T11 and T12 compression deformities, unchanged. IMPRESSION: 1. No evidence of disease progression. 2. 5 mm right lower lobe nodules, stable. Recommend attention on follow-up. 3. 4.4 cm ascending aortic aneurysm. Recommend annual imaging followup by CTA or MRA. This recommendation follows 2010 ACCF/AHA/AATS/ACR/ASA/SCA/SCAI/SIR/STS/SVM Guidelines for the Diagnosis and Management of Patients with Thoracic  Aortic Disease. Circulation. 2010; 121: S063-K160. Aortic aneurysm NOS (ICD10-I71.9). 4. Bilateral adrenal adenomas. 5. Aortic atherosclerosis (ICD10-I70.0). Coronary artery calcification. 6. Enlarged pulmonic trunk, indicative of pulmonary arterial hypertension. 7.  Emphysema (ICD10-J43.9). Electronically Signed   By: Leanna Battles M.D.   On: 10/04/2023 13:02

## 2023-10-05 ENCOUNTER — Inpatient Hospital Stay: Payer: PPO | Admitting: Hematology

## 2023-10-05 VITALS — BP 155/85 | HR 65 | Temp 98.2°F | Resp 16 | Wt 205.0 lb

## 2023-10-05 DIAGNOSIS — C3491 Malignant neoplasm of unspecified part of right bronchus or lung: Secondary | ICD-10-CM

## 2023-10-05 DIAGNOSIS — Z08 Encounter for follow-up examination after completed treatment for malignant neoplasm: Secondary | ICD-10-CM | POA: Diagnosis not present

## 2023-10-05 NOTE — Patient Instructions (Addendum)
[facility] at [facility] Discharge Instructions   You were seen and examined today by Dr. Ellin Saba.  He reviewed the results of your lab work which are normal/stable.   He reviewed the results of your CT scan which is stable.   We will see you back in 6 months. We will repeat lab work and a CT scan prior to your next visit.    Return as scheduled.    Thank you for choosing [facility] at [facility] to provide your oncology and hematology care.  To afford each patient quality time with our provider, please arrive at least 15 minutes before your scheduled appointment time.   If you have a lab appointment with the Cancer Center please come in thru the Main Entrance and check in at the main information desk.  You need to re-schedule your appointment should you arrive 10 or more minutes late.  We strive to give you quality time with our providers, and arriving late affects you and other patients whose appointments are after yours.  Also, if you no show three or more times for appointments you may be dismissed from the clinic at the providers discretion.     Again, thank you for choosing [facility].  Our hope is that these requests will decrease the amount of time that you wait before being seen by our physicians.       _____________________________________________________________  Should you have questions after your visit to [facility], please contact our office at [phone] and follow the prompts.  Our office hours are 8:00 a.m. and 4:30 p.m. Monday - Friday.  Please note that voicemails left after 4:00 p.m. may not be returned until the following business day.  We are closed weekends and major holidays.  You do have access to a nurse 24-7, just call the main number to the clinic [phone] and do not press any options, hold on the line and a nurse will answer the phone.    For prescription refill requests, have  your pharmacy contact our office and allow 72 hours.    Due to Covid, you will need to wear a mask upon entering the hospital. If you do not have a mask, a mask will be given to you at the Main Entrance upon arrival. For doctor visits, patients may have 1 support person age 71 or older with them. For treatment visits, patients can not have anyone with them due to social distancing guidelines and our immunocompromised population.

## 2023-11-01 NOTE — Progress Notes (Unsigned)
 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   Also denies any obvious fluctuation of symptoms with weather or environmental changes or other aggravating or alleviating factors except as outlined above   No unusual exposure hx or h/o childhood pna/ asthma or knowledge of premature birth.  Current Allergies, Complete Past Medical History, Past Surgical History, Family History, and Social History were reviewed in [employer] record.  ROS  The following are not active complaints unless bolded Hoarseness, sore throat, dysphagia, dental problems, itching, sneezing,  nasal congestion or discharge of excess mucus sensation or purulent secretions, ear ache,   fever, chills, sweats, unintended wt loss or wt gain, classically pleuritic or exertional cp,  orthopnea pnd or arm/hand swelling  or leg swelling, presyncope, palpitations,  abdominal pain, anorexia, nausea, vomiting, diarrhea  or change in bowel habits or change in bladder habits, change in stools or change in urine, dysuria, hematuria,  rash, arthralgias, visual complaints, headache, numbness, weakness or ataxia or problems with walking or coordination,  change in mood or  memory.            Outpatient Medications Prior to Visit  Medication Sig Dispense Refill   albuterol (PROVENTIL) (2.5 MG/3ML) 0.083% nebulizer solution Take 2.5 mg by nebulization every 6 (six) hours as needed for wheezing or shortness of breath.      albuterol (VENTOLIN HFA) 108 (90 Base) MCG/ACT inhaler INHALE 2 PUFFS INTO THE LUNGS EVERY 6 HOURS AS NEEDED 18 each 5   amLODipine (NORVASC) 5 MG tablet Take 1 tablet (5 mg total) by mouth daily. 90 tablet 3   aspirin EC 81 MG tablet Take 1 tablet (81 mg total) by mouth daily. Swallow whole. 90 tablet 3   atorvastatin (LIPITOR) 80 MG tablet Take 1 tablet (80 mg total) by mouth every other day. AT NIGHT 45 tablet 3   Budeson-Glycopyrrol-Formoterol (BREZTRI AEROSPHERE) 160-9-4.8 MCG/ACT AERO INHALE 2 PUFFS INTO THE LUNGS IN THE MORNING AND AT BEDTIME. 10.7 each 1   carvedilol (COREG) 3.125 MG tablet Take 1 tablet (3.125 mg total) by mouth 2 (two) times daily. 180 tablet 3   Cholecalciferol (VITAMIN D3) 5000 units CAPS Take 10,000 Units by mouth daily after breakfast.  lisinopril (ZESTRIL) 40 MG tablet Take 1 tablet (40 mg total) by mouth daily. 90 tablet 3   No facility-administered medications prior to visit.    Past Medical History:  Diagnosis Date   AAA (abdominal aortic aneurysm) (HCC)    CHF (congestive heart failure) (HCC)    Chronic kidney disease (CKD), stage III (moderate) (HCC)    Complication of anesthesia    Bowel and bladder are slow to wake up after surgery   COPD (chronic obstructive pulmonary disease) (HCC)    Coronary artery disease    presumed   Dyspnea    exertion   High cholesterol    Lung cancer (HCC)     Unspecified essential hypertension       Objective:     BP (!) 157/92   Pulse 62   Ht 5\' 9"  (1.753 m)   Wt 207 lb (93.9 kg)   SpO2 95% Comment: room air  BMI 30.57 kg/m   SpO2: 95 % (room air)  Amb pleasant very hoarse wm and   HEENT : Oropharynx  clear / very poor dentition  Nasal turbinates nl    NECK :  without  apparent JVD/ palpable Nodes/TM    LUNGS: no acc muscle use,  Min barrel  contour chest wall with bilateral  slightly decreased bs s audible wheeze and  without cough on insp or exp maneuvers and min  Hyperresonant  to  percussion bilaterally    CV:  RRR  no s3 or murmur or increase in P2, and no edema   ABD: obese  soft and nontender with pos end  insp Hoover's  in the supine position.  No bruits or organomegaly appreciated   MS:  Nl gait/ ext warm without deformities Or obvious joint restrictions  calf tenderness, cyanosis or clubbing     SKIN: warm and dry without lesions    NEURO:  alert, approp, nl sensorium with  no motor or cerebellar deficits apparent.         I personally reviewed images and agree with radiology impression as follows:   Chest CT w/o contrast 09/28/23   Empysema/ stable 5 mm RLL  nodule       Assessment   COPD 2  prior to RULobectomy 2021 Quit smoking 2014  - PFT's  07/19/20  FEV1 2.05 (64% ) ratio 0.52  p 14 % improvement from saba p 0 prior to study with DLCO  23 (91%)   and FV curve mildly concave >>> Then 07/22/20 RUL   Group D (now reclassified as E) in terms of symptom/risk and laba/lama/ICS  therefore appropriate rx at this point >>>  breztri 2bid and approp saba   - The proper method of use, as well as anticipated side effects, of a metered-dose inhaler were discussed and demonstrated to the patient using teach back method using empty hfa container   Benign essential HTN D/c acei 11/03/2023 due to severe hoarseness/ sense of pnds with noted TAA on coreg/ self monitors bp  In the best review of chronic cough to date (  NEJM 2016 375 1544-1551) ,  ACEi are now felt to cause cough in up to  20% of pts which is a 4 fold increase from previous reports and does not include the variety of non-specific complaints we see in pulmonary clinic in pts on ACEi but previously attributed to another dx like  Copd/asthma and  include PNDS, throat congestion, "bronchitis", unexplained dyspnea and noct "strangling" sensations, and hoarseness, but also  atypical /refractory GERD symptoms like dysphagia and "bad heartburn"   The only way I know  to prove this is not an "ACEi Case" is a trial off ACEi x a minimum of 6 weeks then regroup.   >>> try olmesartan 40 mg daily / self monitor daily/ add mild diuretic or increase amlodipine to 10 mg daily or both if needed as risk spillover B2 at higher doses of coreg and in fact he's already adequately BBlocked and anyway.           Each maintenance medication was reviewed in detail including emphasizing most importantly the difference between maintenance and prns and under what circumstances the prns are to be triggered using an action plan format where appropriate.  Total time for H and P, chart review, counseling, reviewing hfa/neb  device(s) and generating customized AVS unique to this office visit / same day charting = 44 min pt new to me          Sandrea Hughs, MD 11/03/2023

## 2023-11-03 ENCOUNTER — Ambulatory Visit: Payer: PPO | Admitting: Internal Medicine

## 2023-11-03 ENCOUNTER — Encounter: Payer: Self-pay | Admitting: Internal Medicine

## 2023-11-03 VITALS — BP 157/92 | HR 62 | Ht 69.0 in | Wt 207.0 lb

## 2023-11-03 DIAGNOSIS — J439 Emphysema, unspecified: Secondary | ICD-10-CM

## 2023-11-03 DIAGNOSIS — I10 Essential (primary) hypertension: Secondary | ICD-10-CM | POA: Diagnosis not present

## 2023-11-03 DIAGNOSIS — J449 Chronic obstructive pulmonary disease, unspecified: Secondary | ICD-10-CM | POA: Diagnosis not present

## 2023-11-03 DIAGNOSIS — J4489 Other specified chronic obstructive pulmonary disease: Secondary | ICD-10-CM

## 2023-11-03 MED ORDER — BREZTRI AEROSPHERE 160-9-4.8 MCG/ACT IN AERO: INHALATION_SPRAY | RESPIRATORY_TRACT | 11 refills | Status: AC

## 2023-11-03 MED ORDER — OLMESARTAN MEDOXOMIL 40 MG PO TABS: 40.0000 mg | ORAL_TABLET | Freq: Every day | ORAL | 11 refills | Status: DC

## 2023-11-03 NOTE — Assessment & Plan Note (Signed)
 Quit smoking 2014  - PFT's  07/19/20  FEV1 2.05 (64% ) ratio 0.52  p 14 % improvement from saba p 0 prior to study with DLCO  23 (91%)   and FV curve mildly concave >>> Then 07/22/20 RUL   Group D (now reclassified as E) in terms of symptom/risk and laba/lama/ICS  therefore appropriate rx at this point >>>  breztri 2bid and approp saba   - The proper method of use, as well as anticipated side effects, of a metered-dose inhaler were discussed and demonstrated to the patient using teach back method using empty hfa container

## 2023-11-03 NOTE — Patient Instructions (Addendum)
 Stop lisinopril and start olmesartan 40 mg one daily in its place  Continue breztri Take 2 puffs first thing in am and then another 2 puffs about 12 hours later.  See your dentist when you can      Please schedule a follow up office visit in 6 weeks, call sooner if needed

## 2023-11-03 NOTE — Assessment & Plan Note (Addendum)
 D/c acei 11/03/2023 due to severe hoarseness/ sense of pnds with noted TAA on coreg/ self monitors bp  In the best review of chronic cough to date ( NEJM 2016 375 1544-1551) ,  ACEi are now felt to cause cough in up to  20% of pts which is a 4 fold increase from previous reports and does not include the variety of non-specific complaints we see in pulmonary clinic in pts on ACEi but previously attributed to another dx like  Copd/asthma and  include PNDS, throat congestion, "bronchitis", unexplained dyspnea and noct "strangling" sensations, and hoarseness, but also  atypical /refractory GERD symptoms like dysphagia and "bad heartburn"   The only way I know  to prove this is not an "ACEi Case" is a trial off ACEi x a minimum of 6 weeks then regroup.   >>> try olmesartan 40 mg daily / self monitor daily/ add mild diuretic or increase amlodipine to 10 mg daily or both if needed as risk spillover B2 at higher doses of coreg and in fact he's already adequately BBlocked and anyway.           Each maintenance medication was reviewed in detail including emphasizing most importantly the difference between maintenance and prns and under what circumstances the prns are to be triggered using an action plan format where appropriate.  Total time for H and P, chart review, counseling, reviewing hfa/neb  device(s) and generating customized AVS unique to this office visit / same day charting = 44 min pt new to me

## 2023-11-12 ENCOUNTER — Encounter: Payer: Self-pay | Admitting: Cardiology

## 2023-11-23 DIAGNOSIS — E7849 Other hyperlipidemia: Secondary | ICD-10-CM | POA: Diagnosis not present

## 2023-11-23 DIAGNOSIS — R739 Hyperglycemia, unspecified: Secondary | ICD-10-CM | POA: Diagnosis not present

## 2023-11-23 DIAGNOSIS — I10 Essential (primary) hypertension: Secondary | ICD-10-CM | POA: Diagnosis not present

## 2023-11-30 DIAGNOSIS — Z85118 Personal history of other malignant neoplasm of bronchus and lung: Secondary | ICD-10-CM | POA: Diagnosis not present

## 2023-11-30 DIAGNOSIS — I714 Abdominal aortic aneurysm, without rupture, unspecified: Secondary | ICD-10-CM | POA: Diagnosis not present

## 2023-11-30 DIAGNOSIS — I509 Heart failure, unspecified: Secondary | ICD-10-CM | POA: Diagnosis not present

## 2023-11-30 DIAGNOSIS — J449 Chronic obstructive pulmonary disease, unspecified: Secondary | ICD-10-CM | POA: Diagnosis not present

## 2023-11-30 DIAGNOSIS — Z681 Body mass index (BMI) 19 or less, adult: Secondary | ICD-10-CM | POA: Diagnosis not present

## 2023-12-12 NOTE — Progress Notes (Unsigned)
 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   Dyspnea:  *** Cough: *** Sleeping: ***   resp cc  SABA use: *** 02: ***  Lung cancer screening: ***   No obvious day to day or daytime variability or assoc excess/ purulent sputum or mucus plugs or hemoptysis or cp or chest tightness, subjective wheeze or overt sinus or hb symptoms.    Also denies any obvious fluctuation of symptoms with weather or environmental changes or other aggravating or alleviating factors except as outlined above   No unusual exposure hx or h/o childhood pna/ asthma or knowledge of premature birth.  Current Allergies, Complete Past Medical History, Past Surgical History, Family History, and Social History were reviewed in [employer] record.  ROS  The following are not active complaints unless bolded Hoarseness, sore throat, dysphagia, dental problems, itching, sneezing,  nasal congestion or discharge of excess  mucus or purulent secretions, ear ache,   fever, chills, sweats, unintended wt loss or wt gain, classically pleuritic or exertional cp,  orthopnea pnd or arm/hand swelling  or leg swelling, presyncope, palpitations, abdominal pain, anorexia, nausea, vomiting, diarrhea  or change in bowel habits or change in bladder habits, change in stools or change in urine, dysuria, hematuria,  rash, arthralgias, visual complaints, headache, numbness, weakness or ataxia or problems with walking or coordination,  change in mood or  memory.        No outpatient medications have been marked as taking for the 12/15/23 encounter (Appointment) with Nyoka Cowden, MD.            Past Medical History:  Diagnosis Date   AAA (abdominal aortic aneurysm) (HCC)    CHF (congestive heart failure) (HCC)    Chronic kidney disease (CKD), stage III (moderate) (HCC)    Complication of anesthesia    Bowel and bladder are slow to wake up after surgery   COPD (chronic obstructive pulmonary disease) (HCC)    Coronary artery disease    presumed   Dyspnea    exertion   High cholesterol    Lung cancer (HCC)    Unspecified essential hypertension       Objective:    Wts  12/15/2023          ***   11/03/23 207 lb (  93.9 kg)  10/05/23 205 lb 0.4 oz (93 kg)  03/24/23 198 lb (89.8 kg)      Vital signs reviewed  12/15/2023  - Note at rest 02 sats  ***% on ***   General appearance:    ***      Min barr***         I personally reviewed images and agree with radiology impression as follows:   Chest CT w/o contrast 09/28/23   Emphysema/ stable 5 mm RLL  nodule       Assessment

## 2023-12-15 ENCOUNTER — Ambulatory Visit: Payer: PPO | Admitting: Internal Medicine

## 2023-12-15 ENCOUNTER — Encounter: Payer: Self-pay | Admitting: Internal Medicine

## 2023-12-15 VITALS — BP 158/90 | HR 52 | Ht 69.0 in | Wt 204.0 lb

## 2023-12-15 DIAGNOSIS — I10 Essential (primary) hypertension: Secondary | ICD-10-CM

## 2023-12-15 DIAGNOSIS — J449 Chronic obstructive pulmonary disease, unspecified: Secondary | ICD-10-CM | POA: Diagnosis not present

## 2023-12-15 NOTE — Patient Instructions (Addendum)
 Ok to take an extra amlodipine 5 mg daily if needed    Plan A = Automatic = Always=    Breztri Take 2 puffs first thing in am and then another 2 puffs about 12 hours later.    Work on inhaler technique:  relax and gently blow all the way out then take a nice smooth full deep breath back in, triggering the inhaler at same time you start breathing in.  Hold breath in for at least  5 seconds if you can. Blow out breztri  thru nose. Rinse and gargle with water when done.  If mouth or throat bother you at all,  try brushing teeth/gums/tongue with arm and hammer toothpaste/ make a slurry and gargle and spit out.      Plan B = Backup (to supplement plan A, not to replace it) Only use your albuterol inhaler as a rescue medication to be used if you can't catch your breath by resting or doing a relaxed purse lip breathing pattern.  - The less you use it, the better it will work when you need it. - Ok to use the inhaler up to 2 puffs  every 4 hours if you must but call for appointment if use goes up over your usual need - Don't leave home without it !!  (think of it like the spare tire for your car)   Plan C = Crisis (instead of Plan B but only if Plan B stops working) - only use your albuterol nebulizer if you first try Plan B and it fails to help > ok to use the nebulizer up to every 4 hours but if start needing it regularly call for immediate appointment  Luden's is the only cough drop I recommend   Please schedule a follow up visit in 12 months but call sooner if needed

## 2023-12-15 NOTE — Assessment & Plan Note (Signed)
 Quit smoking 2014  - PFT's  07/19/20  FEV1 2.05 (64% ) ratio 0.52  p 14 % improvement from saba p 0 prior to study with DLCO  23 (91%)   and FV curve mildly concave >>> Then 07/22/20 RUL - 12/15/2023  After extensive coaching inhaler device,  effectiveness =    80% (ti too long)> continue breztri 2bid and follow with arm and hammer toothpaste   Group D (now reclassified as E) in terms of symptom/risk and laba/lama/ICS  therefore appropriate rx at this point >>>  breztri and approp saba     ABC action plan generated/ f/u can be yearly see avs for instructions unique to this ov

## 2023-12-15 NOTE — Assessment & Plan Note (Addendum)
 D/c acei 11/03/2023 due to severe hoarseness/ sense of pnds with noted TAA on coreg/ self monitors bp  - Although even in retrospect it may not be clear the ACEi contributed to the pt's symptoms,  Pt improved off them and adding them back at this point or in the future would risk confusion in interpretation of non-specific respiratory symptoms to which this patient is prone  ie  Better not to muddy the waters here.   -  pt assures me his home monitoring if fine and his pulse is only 52 so likely already taking max BB so rec extra amlodipine 5 mg daily if needed to get SBP < 130 and f/u with cards/PCP as planned.      Each maintenance medication was reviewed in detail including emphasizing most importantly the difference between maintenance and prns and under what circumstances the prns are to be triggered using an action plan format where appropriate.  Total time for H and P, chart review, counseling, reviewing hfa/neb device(s) and generating customized AVS unique to this office visit / same day charting = 31 min summary f/u ov

## 2023-12-21 ENCOUNTER — Other Ambulatory Visit: Payer: Self-pay | Admitting: Nurse Practitioner

## 2024-01-12 ENCOUNTER — Encounter: Payer: Self-pay | Admitting: Cardiology

## 2024-01-12 ENCOUNTER — Ambulatory Visit: Attending: Cardiology | Admitting: Cardiology

## 2024-01-12 VITALS — BP 130/80 | HR 53 | Ht 69.0 in | Wt 205.8 lb

## 2024-01-12 DIAGNOSIS — I10 Essential (primary) hypertension: Secondary | ICD-10-CM

## 2024-01-12 DIAGNOSIS — I7121 Aneurysm of the ascending aorta, without rupture: Secondary | ICD-10-CM

## 2024-01-12 DIAGNOSIS — E785 Hyperlipidemia, unspecified: Secondary | ICD-10-CM | POA: Diagnosis not present

## 2024-01-12 DIAGNOSIS — I2510 Atherosclerotic heart disease of native coronary artery without angina pectoris: Secondary | ICD-10-CM

## 2024-01-12 DIAGNOSIS — I5032 Chronic diastolic (congestive) heart failure: Secondary | ICD-10-CM | POA: Diagnosis not present

## 2024-01-12 MED ORDER — ASPIRIN 81 MG PO TBEC: 81.0000 mg | DELAYED_RELEASE_TABLET | Freq: Every day | ORAL | 3 refills | Status: AC

## 2024-01-12 NOTE — Patient Instructions (Signed)
 Medication Instructions:  Your physician has recommended you make the following change in your medication:  Start taking an Aspirin  81 mg once daily Continue taking all other medications as prescribed   Labwork: Fasting Lipid Panel to be completed at [facility]  Testing/Procedures: None  Follow-Up: Your physician recommends that you schedule a follow-up appointment in: 6 months  Any Other Special Instructions Will Be Listed Below (If Applicable). Thank you for choosing [facility]!     If you need a refill on your cardiac medications before your next appointment, please call your pharmacy.

## 2024-01-12 NOTE — Progress Notes (Signed)
 Clinical Summary Mr. Nichter is a 73 y.o.male seen today for follow up of the following medical problems.    1.Chronic HFimpEF - echo showed LVEF 40-45%, mod LVH, abnormal but ungraded diastolic function, reported akinesis of the basal anterolateral, mid-anterolateral, and apical lateral wall segments.   - lexiscan  MPI  showed moderate sized interoapical and inferolateral infarct with mild peri-infarct ischemia.  - repeat echo 04/2017 LVEF 60-65%, grade I diastolic dysfunction     - chronic SOB, better with inhalers - no recent LE edema     2 Presumed CAD - based on echo and MPI results, MPI with suggestion of prior infarcts with only mild peri-infarct ischemia. This has been medically managed.    - 03/2017 Lexiscan  with inferior/inferoapical defect with mild to moderate peri-infarct ischemia.      - no recent chest pains - he has not been taking ASA 81mg  as recommended       3. AAA  - followed by vascular - s/p open repair 06/2017        4. CKD III - followed by nephrology, baseline 1.4-1.6      5. Hyperlipidemia - overdue for lipid labs - taking atorvastatin  80mg  every other day, trouble tolerating daily.     6. HTN - home bp's daily 130s/70s   7. Ascending aortic aneurysm - followed by Dr Luna Salinas 08/2021 CT 4 cm ascending aortic aneurysm 05/2022 CTA stable 4.3cm ascending aneurysm - Jan 2025 CT chest noncontrast: 4.4 cm ascending aneurysm   8. History of lung cancer - Right upper lobectomy and lymph node dissection on 07/22/2020  9. COPD - followed by pulmonary    SH: retired from [employer] 2 years ago. Grandkids x2 one is 14 and one is 5, he has 2 children. Daughter works as Engineer, civil (consulting) at [employer]     Past Medical History:  Diagnosis Date   AAA (abdominal aortic aneurysm) (HCC)    CHF (congestive heart failure) (HCC)    Chronic kidney disease (CKD), stage III (moderate) (HCC)    Complication of anesthesia    Bowel and bladder are slow to  wake up after surgery   COPD (chronic obstructive pulmonary disease) (HCC)    Coronary artery disease    presumed   Dyspnea    exertion   High cholesterol    Lung cancer (HCC)    Unspecified essential hypertension      Allergies  Allergen Reactions   Hydrochlorothiazide  Other (See Comments)    Pt states "it made my legs hurt"   Pravastatin Other (See Comments)    Muscle pain     Current Outpatient Medications  Medication Sig Dispense Refill   albuterol  (PROVENTIL ) (2.5 MG/3ML) 0.083% nebulizer solution Take 2.5 mg by nebulization every 6 (six) hours as needed for wheezing or shortness of breath.  (Patient not taking: Reported on 12/15/2023)     albuterol  (VENTOLIN  HFA) 108 (90 Base) MCG/ACT inhaler INHALE 2 PUFFS INTO THE LUNGS EVERY 6 HOURS AS NEEDED 18 each 5   amLODipine  (NORVASC ) 5 MG tablet TAKE 1 TABLET (5 MG TOTAL) BY MOUTH DAILY. 90 tablet 3   aspirin  EC 81 MG tablet Take 1 tablet (81 mg total) by mouth daily. Swallow whole. (Patient not taking: Reported on 12/15/2023) 90 tablet 3   atorvastatin  (LIPITOR ) 80 MG tablet Take 1 tablet (80 mg total) by mouth every other day. AT NIGHT 45 tablet 3   Budeson-Glycopyrrol-Formoterol (BREZTRI  AEROSPHERE) 160-9-4.8 MCG/ACT AERO Take 2 puffs first thing  in am and then another 2 puffs about 12 hours later. 10.7 each 11   carvedilol  (COREG ) 3.125 MG tablet TAKE 1 TABLET BY MOUTH 2 TIMES DAILY. 180 tablet 3   Cholecalciferol (VITAMIN D3) 5000 units CAPS Take 10,000 Units by mouth daily after breakfast.      olmesartan  (BENICAR ) 40 MG tablet Take 1 tablet (40 mg total) by mouth daily. 30 tablet 11   No current facility-administered medications for this visit.     Past Surgical History:  Procedure Laterality Date   ABDOMINAL AORTIC ANEURYSM REPAIR N/A 06/28/2017   Procedure: ANEURYSM ABDOMINAL AORTIC REPAIR OPEN;  Surgeon: Richrd Char, MD;  Location: [facility] OR;  Service: Vascular;  Laterality: N/A;   BRONCHIAL BRUSHINGS  07/02/2020    Procedure: BRONCHIAL BRUSHINGS;  Surgeon: Aleck Hurdle, MD;  Location: [facility];  Service: Pulmonary;;   BRONCHIAL WASHINGS  07/02/2020   Procedure: BRONCHIAL WASHINGS;  Surgeon: Aleck Hurdle, MD;  Location: [facility];  Service: Pulmonary;;  BAL and washings   HAND SURGERY Left    HERNIA REPAIR     INCISIONAL HERNIA REPAIR     LEFT   INTERCOSTAL NERVE BLOCK Right 07/22/2020   Procedure: INTERCOSTAL NERVE BLOCK;  Surgeon: Zelphia Higashi, MD;  Location: [facility] OR;  Service: Thoracic;  Laterality: Right;   IRRIGATION AND DEBRIDEMENT SEBACEOUS CYST     FROM RIGHT SHOULDER   LUNG BIOPSY  07/02/2020   Procedure: LUNG BIOPSY;  Surgeon: Aleck Hurdle, MD;  Location: [facility];  Service: Pulmonary;;   NODE DISSECTION Right 07/22/2020   Procedure: NODE DISSECTION;  Surgeon: Zelphia Higashi, MD;  Location: [facility] OR;  Service: Thoracic;  Laterality: Right;   UMBILICAL HERNIA REPAIR     VIDEO BRONCHOSCOPY Right 07/02/2020   Procedure: VIDEO BRONCHOSCOPY WITHOUT FLUORO;  Surgeon: Aleck Hurdle, MD;  Location: [facility];  Service: Pulmonary;  Laterality: Right;     Allergies  Allergen Reactions   Hydrochlorothiazide  Other (See Comments)    Pt states "it made my legs hurt"   Pravastatin Other (See Comments)    Muscle pain      Family History  Problem Relation Age of Onset   Pneumonia Mother    Other Father        brain tumor   Diabetes Son    Lung cancer Neg Hx      Social History Mr. Gibas reports that he quit smoking about 11 years ago. His smoking use included cigarettes. He started smoking about 58 years ago. He has a 71.3 pack-year smoking history. He has never used smokeless tobacco. Mr. Klebe reports no history of alcohol  use.     Physical Examination Today's Vitals   01/12/24 1258  BP: 130/80  Pulse: (!) 53  SpO2: 96%  Weight: 205 lb 12.8 oz (93.4 kg)  Height: 5\' 9"  (1.753 m)   Body mass index is 30.39 kg/m.  Gen: resting comfortably,  no acute distress HEENT: no scleral icterus, pupils equal round and reactive, no palptable cervical adenopathy,  CV: regular, brady, no mrg, no jvd Resp: Clear to auscultation bilaterally GI: abdomen is soft, non-tender, non-distended, normal bowel sounds, no hepatosplenomegaly MSK: extremities are warm, no edema.  Skin: warm, no rash Neuro:  no focal deficits Psych: appropriate affect   Diagnostic Studies  02/2014 MPI   Raw images showed appropriate radiotracer uptake. There is a   moderate-sized mildly reversible inferoapical and inferolateral wall   defect. There are no other myocardial perfusion defects.  There is   mild inferolateral wall hypokinesis.   Gated imaging shows end-diastolic volume 149 mL, and systolic volume   90 mL, left ventricular ejection fraction 40%.   IMPRESSION:   1. Abnormal Lexiscan  MPI   2. Moderate sized mildl intenstiry inferoapcail and inferolateral   wall infarcts with mild peri-infarct ischemia.   3. Decreased left ventricular systolic function, LVEF 40%   4. Increased study for major cardiac events due to decreased LV   systolic function, there is fairly mild myocardium at jeopardy    02/2014 Echo LVEF 40-45%, abnormal diastolic function. Multiple WMAs,   03/2017 MPI Nuclear stress EF: 61%. There was no ST segment deviation noted during stress. Defect 1: There is a defect present in the basal inferior, basal inferolateral, mid inferior, mid inferolateral and apical inferior location. Findings consistent with ischemia. This is an intermediate risk study. The left ventricular ejection fraction is normal (55-65%).   There is a large area, moderate severity defect in the basal and mid inferolateral, inferior and apical inferior walls (SDS=6) consistent with ischemia in the LCX territory.    04/2017 echo Study Conclusions   - Left ventricle: The cavity size was normal. Wall thickness was   increased in a pattern of moderate LVH. Systolic function  was   normal. The estimated ejection fraction was in the range of 60%   to 65%. Wall motion was normal; there were no regional wall   motion abnormalities. Doppler parameters are consistent with   abnormal left ventricular relaxation (grade 1 diastolic   dysfunction). - Aortic valve: Valve area (VTI): 3.52 cm^2. Valve area (Vmax):   3.11 cm^2. Valve area (Vmean): 3.12 cm^2. - Aorta: Aortic root dimension: 40 mm (ED). Ascending aortic   diameter: 40 mm (S). - Aortic root: The aortic root was mildly dilated. - Left atrium: The atrium was moderately dilated. - Technically adequate study.     Assessment and Plan   1. Chronic HFimpEF - by most recent echo LVEF has normalized.  - MPI shows evidence of possible old infarct, likely a component of ICM, though could be mixed with a HTN CM as well.   - no recent symptoms, continue current meds     2. Presumed CAD   - prior nuclear stress tests including a recent test have shown evidence of prior infarct with peri-infarct ischemia - this has been managed medically in the absence of symptoms combined with his CKD   - no symptoms, recommeneded again to start ASA 81mg  daily.  -EKG today shows SR, PACs, no acute ischemic changes.    3. Hyperlipidemia - continue every other day atorvastatin , did not tolerate daily dosing.  - repeat lipid panel       4. HTN - at goal, continue current meds     Laurann Pollock, M.D.

## 2024-01-13 ENCOUNTER — Other Ambulatory Visit (HOSPITAL_COMMUNITY)
Admission: RE | Admit: 2024-01-13 | Discharge: 2024-01-13 | Disposition: A | Discharge status: Home / Self Care | Source: Ambulatory Visit | Attending: Cardiology | Admitting: Cardiology

## 2024-01-13 DIAGNOSIS — E785 Hyperlipidemia, unspecified: Secondary | ICD-10-CM | POA: Diagnosis not present

## 2024-01-13 LAB — LIPID PANEL
Cholesterol: 151 mg/dL (ref 0–200)
HDL: 31 mg/dL — ABNORMAL LOW (ref >40)
LDL Cholesterol: 104 mg/dL — ABNORMAL HIGH (ref 0–99)
Total CHOL/HDL Ratio: 4.9 ratio
Triglycerides: 82 mg/dL (ref <150)
VLDL: 16 mg/dL (ref 0–40)

## 2024-01-18 ENCOUNTER — Ambulatory Visit: Payer: Self-pay

## 2024-01-18 MED ORDER — EZETIMIBE 10 MG PO TABS: 10.0000 mg | ORAL_TABLET | Freq: Every day | ORAL | 1 refills | Status: DC

## 2024-01-18 NOTE — Telephone Encounter (Signed)
Follow Up:      Patient is returning a call, concerning his results. 

## 2024-02-07 DIAGNOSIS — H25811 Combined forms of age-related cataract, right eye: Secondary | ICD-10-CM | POA: Diagnosis not present

## 2024-02-07 DIAGNOSIS — H2512 Age-related nuclear cataract, left eye: Secondary | ICD-10-CM | POA: Diagnosis not present

## 2024-02-07 DIAGNOSIS — H52223 Regular astigmatism, bilateral: Secondary | ICD-10-CM | POA: Diagnosis not present

## 2024-02-07 DIAGNOSIS — H02831 Dermatochalasis of right upper eyelid: Secondary | ICD-10-CM | POA: Diagnosis not present

## 2024-02-21 DIAGNOSIS — H25811 Combined forms of age-related cataract, right eye: Secondary | ICD-10-CM | POA: Diagnosis not present

## 2024-02-23 ENCOUNTER — Encounter (HOSPITAL_COMMUNITY)
Admission: RE | Admit: 2024-02-23 | Discharge: 2024-02-23 | Disposition: A | Discharge status: Home / Self Care | Source: Ambulatory Visit | Attending: Ophthalmology | Admitting: Ophthalmology

## 2024-02-24 ENCOUNTER — Other Ambulatory Visit: Payer: Self-pay

## 2024-02-24 ENCOUNTER — Encounter (HOSPITAL_COMMUNITY): Payer: Self-pay

## 2024-02-24 NOTE — H&P (Signed)
 Surgical History & Physical  Patient Name: Nicolas Butler  DOB: Nov 12, 1950  Surgery: Cataract extraction with intraocular lens implant phacoemulsification; Right Eye Surgeon: Tarri Farm MD Surgery Date: 02/28/2024 Pre-Op Date: 02/07/2024  HPI: A 61 Yr. old male patient 1. The patient is a new patient present today for a Cataract Evaluation. The patient complains of difficulty when reading fine print, books, newspaper, instructions etc.. Both eyes are affected. The patient describes foggy symptoms affecting their eyes/vision. The condition's severity increased since last visit. The complaint is associated with blurry vision. This is negatively affecting the patient's quality of life and the patient is unable to function adequately in life with the current level of vision. HPI Completed by Dr. Deliah Fells was performed by Tarri Farm .  Medical History: Cataracts  Cancer Heart Problem High Blood Pressure Lung Problems  Review of Systems Cardiovascular High Blood Pressure Respiratory COPD, lung cancer All recorded systems are negative except as noted above.  Social Former smoker  Medication Prednisolon-moxiflox-bromf(PF),  Amlodipine , Olmesartan , Ezetimibe , Aspirin , Carvedilol , Breztri  Aerosphere  Sx/Procedures None  Drug Allergies  NKDA  History & Physical: Heent: cataracts NECK: supple without bruits LUNGS: lungs clear to auscultation CV: regular rate and rhythm Abdomen: soft and non-tender  Impression & Plan: Assessment: 1.  COMBINED FORMS AGE RELATED CATARACT; Right Eye (H25.811) 2.  NUCLEAR SCLEROSIS AGE RELATED; Left Eye (H25.12) 3.  ASTIGMATISM, REGULAR; Both Eyes (H52.223) 4.  DERMATOCHALASIS, no surgery; Right Upper Lid, Left Upper Lid (H02.831, H02.834) 5.  BLEPHARITIS; Right Upper Lid, Right Lower Lid, Left Upper Lid, Left Lower Lid (H01.001, H01.002,H01.004,H01.005) 6.  Pinguecula; Both Eyes (H11.153) 7.  CORNEAL SCAR (H17.9)  Plan: 1.  Cataract  accounts for the patient's decreased vision. This visual impairment is not correctable with a tolerable change in glasses or contact lenses. Cataract surgery with an implantation of a new lens should significantly improve the visual and functional status of the patient. Discussed all risks, benefits, alternatives, and potential complications. Discussed the procedures and recovery. Patient desires to have surgery. A-scan ordered and performed today for intra-ocular lens calculations. The surgery will be performed in order to improve vision for driving, reading, and for eye examinations. Recommend phacoemulsification with intra-ocular lens. Recommend Dextenza  for post-operative pain and inflammation. Right Eye worse. OD first, then OS. Recommend Toric Lens. Dilates moderately - Lidocaine /Omidria by protocol.  2.  See above.  3.  Recommend Toric lens OU.  4.  Asymptomatic, recommend observation for now. Findings, prognosis and treatment options reviewed.  5.  Blepharitis is present - recommend regular lid cleaning.  6.  Observe; Artificial tears as needed for irritation.  7.  Monitor.

## 2024-02-28 ENCOUNTER — Other Ambulatory Visit: Payer: Self-pay

## 2024-02-28 ENCOUNTER — Ambulatory Visit (HOSPITAL_COMMUNITY): Payer: Self-pay | Admitting: Anesthesiology

## 2024-02-28 ENCOUNTER — Encounter (HOSPITAL_COMMUNITY): Payer: Self-pay | Admitting: Ophthalmology

## 2024-02-28 ENCOUNTER — Encounter (HOSPITAL_COMMUNITY)
Admission: RE | Disposition: A | Discharge status: Home / Self Care | Payer: Self-pay | Source: Home / Self Care | Attending: Ophthalmology

## 2024-02-28 ENCOUNTER — Ambulatory Visit (HOSPITAL_COMMUNITY)
Admission: RE | Admit: 2024-02-28 | Discharge: 2024-02-28 | Disposition: A | Discharge status: Home / Self Care | Attending: Ophthalmology | Admitting: Ophthalmology

## 2024-02-28 DIAGNOSIS — N183 Chronic kidney disease, stage 3 unspecified: Secondary | ICD-10-CM | POA: Insufficient documentation

## 2024-02-28 DIAGNOSIS — H25811 Combined forms of age-related cataract, right eye: Secondary | ICD-10-CM | POA: Diagnosis not present

## 2024-02-28 DIAGNOSIS — I2510 Atherosclerotic heart disease of native coronary artery without angina pectoris: Secondary | ICD-10-CM | POA: Insufficient documentation

## 2024-02-28 DIAGNOSIS — I110 Hypertensive heart disease with heart failure: Secondary | ICD-10-CM | POA: Diagnosis not present

## 2024-02-28 DIAGNOSIS — H5711 Ocular pain, right eye: Secondary | ICD-10-CM | POA: Insufficient documentation

## 2024-02-28 DIAGNOSIS — Z87891 Personal history of nicotine dependence: Secondary | ICD-10-CM | POA: Insufficient documentation

## 2024-02-28 DIAGNOSIS — I509 Heart failure, unspecified: Secondary | ICD-10-CM | POA: Insufficient documentation

## 2024-02-28 DIAGNOSIS — I130 Hypertensive heart and chronic kidney disease with heart failure and stage 1 through stage 4 chronic kidney disease, or unspecified chronic kidney disease: Secondary | ICD-10-CM | POA: Diagnosis not present

## 2024-02-28 DIAGNOSIS — J449 Chronic obstructive pulmonary disease, unspecified: Secondary | ICD-10-CM | POA: Diagnosis not present

## 2024-02-28 DIAGNOSIS — H2181 Floppy iris syndrome: Secondary | ICD-10-CM | POA: Diagnosis not present

## 2024-02-28 DIAGNOSIS — I5022 Chronic systolic (congestive) heart failure: Secondary | ICD-10-CM | POA: Diagnosis not present

## 2024-02-28 HISTORY — PX: INSERTION, STENT, DRUG-ELUTING, LACRIMAL CANALICULUS: SHX7453

## 2024-02-28 HISTORY — PX: CATARACT EXTRACTION W/PHACO: SHX586

## 2024-02-28 SURGERY — PHACOEMULSIFICATION, CATARACT, WITH IOL INSERTION
Anesthesia: Monitor Anesthesia Care | Site: Eye | Laterality: Right

## 2024-02-28 MED ORDER — TROPICAMIDE 1 % OP SOLN
1.0000 [drp] | OPHTHALMIC | Status: AC | PRN
  Administered 2024-02-28 (×3): 1 [drp] via OPHTHALMIC

## 2024-02-28 MED ORDER — DEXAMETHASONE 0.4 MG OP INST
VAGINAL_INSERT | OPHTHALMIC | Status: DC | PRN
  Administered 2024-02-28: .4 mg via OPHTHALMIC

## 2024-02-28 MED ORDER — MOXIFLOXACIN HCL 5 MG/ML IO SOLN
INTRAOCULAR | Status: DC | PRN
  Administered 2024-02-28: .2 mL via INTRACAMERAL

## 2024-02-28 MED ORDER — TETRACAINE HCL 0.5 % OP SOLN
1.0000 [drp] | OPHTHALMIC | Status: AC | PRN
  Administered 2024-02-28 (×3): 1 [drp] via OPHTHALMIC

## 2024-02-28 MED ORDER — PHENYLEPHRINE HCL 2.5 % OP SOLN
1.0000 [drp] | OPHTHALMIC | Status: AC | PRN
  Administered 2024-02-28 (×3): 1 [drp] via OPHTHALMIC

## 2024-02-28 MED ORDER — BSS IO SOLN
INTRAOCULAR | Status: DC | PRN
  Administered 2024-02-28: 15 mL via INTRAOCULAR

## 2024-02-28 MED ORDER — STERILE WATER FOR IRRIGATION IR SOLN
Status: DC | PRN
Start: 2024-02-28 — End: 2024-02-28
  Administered 2024-02-28: 250 mL

## 2024-02-28 MED ORDER — LIDOCAINE HCL 3.5 % OP GEL
1.0000 | Freq: Once | OPHTHALMIC | Status: AC
  Administered 2024-02-28: 1 via OPHTHALMIC

## 2024-02-28 MED ORDER — PHENYLEPHRINE-KETOROLAC 1-0.3 % IO SOLN
INTRAOCULAR | Status: DC | PRN
  Administered 2024-02-28: 500 mL via OPHTHALMIC

## 2024-02-28 MED ORDER — LACTATED RINGERS IV SOLN: INTRAVENOUS | Status: DC

## 2024-02-28 MED ORDER — DEXAMETHASONE 0.4 MG OP INST
VAGINAL_INSERT | OPHTHALMIC | Status: AC
  Filled 2024-02-28: qty 1

## 2024-02-28 MED ORDER — POVIDONE-IODINE 5 % OP SOLN
OPHTHALMIC | Status: DC | PRN
  Administered 2024-02-28: 1 via OPHTHALMIC

## 2024-02-28 MED ORDER — NA HYALUR & NA CHOND-NA HYALUR 0.55-0.5 ML IO KIT
PACK | INTRAOCULAR | Status: DC | PRN
  Administered 2024-02-28: 1 via INTRAOCULAR

## 2024-02-28 SURGICAL SUPPLY — 13 items

## 2024-02-28 NOTE — Anesthesia Postprocedure Evaluation (Signed)
 Anesthesia Post Note  Patient: Nicolas Butler  Procedure(s) Performed: PHACOEMULSIFICATION, CATARACT, WITH IOL INSERTION (Right: Eye) INSERTION, STENT, DRUG-ELUTING, LACRIMAL CANALICULUS (Right: Eye)  Patient location during evaluation: Phase II Anesthesia Type: MAC Level of consciousness: awake Pain management: pain level controlled Vital Signs Assessment: post-procedure vital signs reviewed and stable Respiratory status: spontaneous breathing and respiratory function stable Cardiovascular status: blood pressure returned to baseline and stable Postop Assessment: no headache and no apparent nausea or vomiting Anesthetic complications: no Comments: Late entry   No notable events documented.   Last Vitals:  Vitals:   02/28/24 0942 02/28/24 1050  BP: (!) 157/96 (!) 169/93  Pulse: (!) 53 (!) 51  Resp: (!) 23 18  Temp: 36.5 C 36.6 C  SpO2: 97% 100%    Last Pain:  Vitals:   02/28/24 1052  TempSrc:   PainSc: 0-No pain                 Yvonna JINNY [name]

## 2024-02-28 NOTE — Interval H&P Note (Signed)
 History and Physical Interval Note:  02/28/2024 10:26 AM  Nicolas Butler  has presented today for surgery, with the diagnosis of combined forms age related cataract, right eye.  The various methods of treatment have been discussed with the patient and family. After consideration of risks, benefits and other options for treatment, the patient has consented to  Procedure(s) with comments: PHACOEMULSIFICATION, CATARACT, WITH IOL INSERTION (Right) - CDE: INSERTION, STENT, DRUG-ELUTING, LACRIMAL CANALICULUS (Right) as a surgical intervention.  The patient's history has been reviewed, patient examined, no change in status, stable for surgery.  I have reviewed the patient's chart and labs.  Questions were answered to the patient's satisfaction.     HARRIE AGENT

## 2024-02-28 NOTE — Discharge Instructions (Signed)
 Please discharge patient when stable, will follow up today with Dr. June Leap at the [facility] office immediately following discharge.  Leave shield in place until visit.  All paperwork with discharge instructions will be given at the office.  [facility] Address:  [address]  [place], [place] [address]

## 2024-02-28 NOTE — Transfer of Care (Signed)
 Immediate Anesthesia Transfer of Care Note  Patient: Nicolas Butler  Procedure(s) Performed: PHACOEMULSIFICATION, CATARACT, WITH IOL INSERTION (Right: Eye) INSERTION, STENT, DRUG-ELUTING, LACRIMAL CANALICULUS (Right: Eye)  Patient Location: Short Stay  Anesthesia Type:MAC  Level of Consciousness: awake, alert , and oriented  Airway & Oxygen Therapy: Patient Spontanous Breathing  Post-op Assessment: Report given to RN and Post -op Vital signs reviewed and stable  Post vital signs: Reviewed and stable  Last Vitals:  Vitals Value Taken Time  BP 169/93 02/28/24 10:50  Temp 36.6 C 02/28/24 10:50  Pulse 51 02/28/24 10:50  Resp 18 02/28/24 10:50  SpO2 100 % 02/28/24 10:50    Last Pain:  Vitals:   02/28/24 1050  TempSrc: Oral  PainSc:       Patients Stated Pain Goal: 5 (02/28/24 0942)  Complications: No notable events documented.

## 2024-02-28 NOTE — Op Note (Signed)
 Date of procedure: 02/28/24  Pre-operative diagnosis: Visually significant age-related combined cataract, Right Eye (H25.811), Poor dilation of the right eye  Post-operative diagnosis:  Visually significant age-related combined cataract, Right Eye (H25.811) Intra-operative floppy iris syndrome, Right eye (H21.81) 3.   Pain and inflammation following cataract surgery, Right Eye (H57.11)  Procedure:  Complex Removal of cataract via phacoemulsification and insertion of intra-ocular lens [employer] DIB00 +21.0D into the capsular bag of the Right Eye [phone]) 2. Placement of Dextenza  Implant, Right Lower Lid  Attending surgeon: Lynwood LABOR. Sheilla Maris, MD, MA  Anesthesia: MAC, Topical Akten  Complications: None  Estimated Blood Loss: <48mL (minimal)  Specimens: None  Implants: As above  Indications:  Visually significant age-related cataract, Right Eye  Procedure:  The patient was seen and identified in the pre-operative area. The operative eye was identified and dilated.  The operative eye was marked.  Topical anesthesia was administered to the operative eye.     The patient was then to the operative suite and placed in the supine position.  A timeout was performed confirming the patient, procedure to be performed, and all other relevant information.   The patient's face was prepped and draped in the usual fashion for intra-ocular surgery.  A lid speculum was placed into the operative eye and the surgical microscope moved into place and focused. Poor dilation of the iris was confirmed. An superotemporal paracentesis was created using a 20 gauge paracentesis blade.  Shugarcaine was injected into the anterior chamber.  Viscoelastic was injected into the anterior chamber.  A temporal clear-corneal main wound incision was created using a 2.46mm microkeratome.  A 7.18mm Malyugin ring was placed. A continuous curvilinear capsulorrhexis was initiated using an irrigating cystitome and completed using  capsulorrhexis forceps.  Hydrodissection and hydrodeliniation were performed.  Viscoelastic was injected into the anterior chamber.  A phacoemulsification handpiece and a chopper as a second instrument were used to remove the nucleus and epinucleus. The irrigation/aspiration handpiece was used to remove any remaining cortical material.   The capsular bag was reinflated with viscoelastic, checked, and found to be intact.  The intraocular lens was inserted into the capsular bag. The Malyugin ring was removed. The irrigation/aspiration handpiece was used to remove any remaining viscoelastic.  The clear corneal wound and paracentesis wounds were then hydrated and checked with Weck-Cels to be watertight. 0.1mL of Moxifloxacin was injected into the anterior chamber.  The lid-speculum was removed.    The lower canaliculus was dilated and filled with Provisc. A Dextenza  implant was placed in the lower canaliculus without complication.  The drape was removed.  The patient's face was cleaned with a wet and dry 4x4.   A clear shield was taped over the eye. The patient was taken to the post-operative care unit in good condition, having tolerated the procedure well.  Post-Op Instructions: The patient will follow up at [facility] for a same day post-operative evaluation and will receive all other orders and instructions.

## 2024-02-28 NOTE — Anesthesia Preprocedure Evaluation (Signed)
 Anesthesia Evaluation  Patient identified by MRN, date of birth, ID band Patient awake    Reviewed: Allergy & Precautions, H&P , NPO status , Patient's Chart, lab work & pertinent test results, reviewed documented beta blocker date and time   History of Anesthesia Complications (+) history of anesthetic complications  Airway Mallampati: II  TM Distance: >3 FB Neck ROM: full    Dental no notable dental hx.    Pulmonary shortness of breath, COPD, former smoker   Pulmonary exam normal breath sounds clear to auscultation       Cardiovascular Exercise Tolerance: Good hypertension, + CAD and +CHF   Rhythm:regular Rate:Normal     Neuro/Psych negative neurological ROS  negative psych ROS   GI/Hepatic negative GI ROS, Neg liver ROS,,,  Endo/Other  negative endocrine ROS    Renal/GU Renal disease  negative genitourinary   Musculoskeletal   Abdominal   Peds  Hematology  (+) Blood dyscrasia, anemia   Anesthesia Other Findings   Reproductive/Obstetrics negative OB ROS                             Anesthesia Physical Anesthesia Plan  ASA: 3  Anesthesia Plan: MAC   Post-op Pain Management:    Induction:   PONV Risk Score and Plan:   Airway Management Planned:   Additional Equipment:   Intra-op Plan:   Post-operative Plan:   Informed Consent: I have reviewed the patients History and Physical, chart, labs and discussed the procedure including the risks, benefits and alternatives for the proposed anesthesia with the patient or authorized representative who has indicated his/her understanding and acceptance.     Dental Advisory Given  Plan Discussed with: CRNA  Anesthesia Plan Comments:        Anesthesia Quick Evaluation

## 2024-02-29 ENCOUNTER — Encounter (HOSPITAL_COMMUNITY): Payer: Self-pay | Admitting: Ophthalmology

## 2024-03-27 ENCOUNTER — Inpatient Hospital Stay: Payer: PPO | Attending: Hematology

## 2024-03-27 ENCOUNTER — Ambulatory Visit (HOSPITAL_COMMUNITY)
Admission: RE | Admit: 2024-03-27 | Discharge: 2024-03-27 | Disposition: A | Discharge status: Home / Self Care | Payer: PPO | Source: Ambulatory Visit | Attending: Hematology | Admitting: Hematology

## 2024-03-27 DIAGNOSIS — I700 Atherosclerosis of aorta: Secondary | ICD-10-CM | POA: Diagnosis not present

## 2024-03-27 DIAGNOSIS — Z85118 Personal history of other malignant neoplasm of bronchus and lung: Secondary | ICD-10-CM | POA: Insufficient documentation

## 2024-03-27 DIAGNOSIS — C3491 Malignant neoplasm of unspecified part of right bronchus or lung: Secondary | ICD-10-CM

## 2024-03-27 DIAGNOSIS — Z08 Encounter for follow-up examination after completed treatment for malignant neoplasm: Secondary | ICD-10-CM | POA: Diagnosis not present

## 2024-03-27 DIAGNOSIS — H2512 Age-related nuclear cataract, left eye: Secondary | ICD-10-CM | POA: Diagnosis not present

## 2024-03-27 DIAGNOSIS — J439 Emphysema, unspecified: Secondary | ICD-10-CM | POA: Diagnosis not present

## 2024-03-27 DIAGNOSIS — C3490 Malignant neoplasm of unspecified part of unspecified bronchus or lung: Secondary | ICD-10-CM | POA: Diagnosis not present

## 2024-03-27 LAB — CBC WITH DIFFERENTIAL/PLATELET
Abs Immature Granulocytes: 0.02 K/uL (ref 0.00–0.07)
Basophils Absolute: 0.1 K/uL (ref 0.0–0.1)
Basophils Relative: 1 %
Eosinophils Absolute: 0.4 K/uL (ref 0.0–0.5)
Eosinophils Relative: 5 %
HCT: 45.2 % (ref 39.0–52.0)
Hemoglobin: 15.6 g/dL (ref 13.0–17.0)
Immature Granulocytes: 0 %
Lymphocytes Relative: 20 %
Lymphs Abs: 1.6 K/uL (ref 0.7–4.0)
MCH: 34.4 pg — ABNORMAL HIGH (ref 26.0–34.0)
MCHC: 34.5 g/dL (ref 30.0–36.0)
MCV: 99.8 fL (ref 80.0–100.0)
Monocytes Absolute: 0.6 K/uL (ref 0.1–1.0)
Monocytes Relative: 8 %
Neutro Abs: 5.2 K/uL (ref 1.7–7.7)
Neutrophils Relative %: 66 %
Platelets: 182 K/uL (ref 150–400)
RBC: 4.53 MIL/uL (ref 4.22–5.81)
RDW: 12.8 % (ref 11.5–15.5)
WBC: 7.9 K/uL (ref 4.0–10.5)
nRBC: 0 % (ref 0.0–0.2)

## 2024-03-27 LAB — COMPREHENSIVE METABOLIC PANEL WITH GFR
ALT: 13 U/L (ref 0–44)
AST: 15 U/L (ref 15–41)
Albumin: 4 g/dL (ref 3.5–5.0)
Alkaline Phosphatase: 79 U/L (ref 38–126)
Anion gap: 10 (ref 5–15)
BUN: 18 mg/dL (ref 8–23)
CO2: 25 mmol/L (ref 22–32)
Calcium: 8.9 mg/dL (ref 8.9–10.3)
Chloride: 99 mmol/L (ref 98–111)
Creatinine, Ser: 1.38 mg/dL — ABNORMAL HIGH (ref 0.61–1.24)
GFR, Estimated: 54 mL/min — ABNORMAL LOW (ref >60)
Glucose, Bld: 105 mg/dL — ABNORMAL HIGH (ref 70–99)
Potassium: 3.8 mmol/L (ref 3.5–5.1)
Sodium: 134 mmol/L — ABNORMAL LOW (ref 135–145)
Total Bilirubin: 0.8 mg/dL (ref 0.0–1.2)
Total Protein: 7.7 g/dL (ref 6.5–8.1)

## 2024-03-29 ENCOUNTER — Encounter (HOSPITAL_COMMUNITY)
Admission: RE | Admit: 2024-03-29 | Discharge: 2024-03-29 | Disposition: A | Discharge status: Home / Self Care | Source: Ambulatory Visit | Attending: Ophthalmology | Admitting: Ophthalmology

## 2024-03-29 NOTE — H&P (Signed)
 Surgical History & Physical  Patient Name: Nicolas Butler  DOB: [birth year]  Surgery: Cataract extraction with intraocular lens implant phacoemulsification; Left Eye Surgeon: Lynwood Hermann MD Surgery Date: 04/03/2024 Pre-Op Date: 03/06/2024  HPI: A 8 Yr. old male patient 1. The patient is returning after cataract surgery. The right eye is affected. Status post cataract surgery, which began 1 week ago: Since the last visit, the affected area feels improvement. The patient's vision is improved. The condition's severity is constant. Patient is following medication instructions. 2. The patient is returning for a cataract follow-up of the left eye. Since the last visit, the affected area is tolerating. The patient's vision is blurry. The condition's severity is constant. Patient is not taking medications. This is negatively affecting the patient's quality of life and the patient is unable to function adequately in life with the current level of vision. HPI Completed by Dr. Lynwood Hermann  Medical History: Cataracts  Cancer Heart Problem High Blood Pressure Lung Problems  Review of Systems Cardiovascular High Blood Pressure Respiratory COPD, lung cancer All recorded systems are negative except as noted above.  Social Former smoker  Medication Prednisolon-moxiflox-bromf(PF),  Amlodipine , Olmesartan , Ezetimibe , Aspirin , Carvedilol , Breztri  Aerosphere  Sx/Procedures None  Drug Allergies  NKDA  History & Physical: Heent: cataract NECK: supple without bruits LUNGS: lungs clear to auscultation CV: regular rate and rhythm Abdomen: soft and non-tender  Impression & Plan: Assessment: 1.  CATARACT EXTRACTION STATUS; Right Eye (Z98.41) 2.  NUCLEAR SCLEROSIS AGE RELATED; Left Eye (H25.12)  Plan: 1.  1 week after cataract surgery. Doing well with improved vision and normal eye pressure. Call with any problems or concerns. Continue Pred-Mox-Brom Combo drop 2x/day for 1 more week  (Dextenza ).  2.  Cataract accounts for the patient's decreased vision. This visual impairment is not correctable with a tolerable change in glasses or contact lenses. Cataract surgery with an implantation of a new lens should significantly improve the visual and functional status of the patient.Discussed all risks, benefits, alternatives, and potential complications. Discussed the procedures and recovery. Patient desires to have surgery. A-scan ordered and performed today for intra-ocular lens calculations. The surgery will be performed in order to improve vision for driving, reading, and for eye examinations. Recommend phacoemulsification with intra-ocular lens. Recommend Dextenza  for post-operative pain and inflammation. History of refractive Surgery: none Use of Eye Pressure Lowering Drops: none Left Eye. Surgery required to correct imbalance of vision. Dilates well - shugarcaine or Lidocaine +Omidira by protocol

## 2024-04-03 ENCOUNTER — Encounter (HOSPITAL_COMMUNITY)
Admission: RE | Disposition: A | Discharge status: Home / Self Care | Payer: Self-pay | Source: Home / Self Care | Attending: Ophthalmology

## 2024-04-03 ENCOUNTER — Ambulatory Visit (HOSPITAL_COMMUNITY): Payer: Self-pay | Admitting: Anesthesiology

## 2024-04-03 ENCOUNTER — Ambulatory Visit (HOSPITAL_COMMUNITY)
Admission: RE | Admit: 2024-04-03 | Discharge: 2024-04-03 | Disposition: A | Discharge status: Home / Self Care | Attending: Ophthalmology | Admitting: Ophthalmology

## 2024-04-03 ENCOUNTER — Inpatient Hospital Stay: Payer: PPO | Admitting: Hematology

## 2024-04-03 DIAGNOSIS — I5022 Chronic systolic (congestive) heart failure: Secondary | ICD-10-CM

## 2024-04-03 DIAGNOSIS — H2512 Age-related nuclear cataract, left eye: Secondary | ICD-10-CM | POA: Diagnosis not present

## 2024-04-03 DIAGNOSIS — J449 Chronic obstructive pulmonary disease, unspecified: Secondary | ICD-10-CM | POA: Diagnosis not present

## 2024-04-03 DIAGNOSIS — N183 Chronic kidney disease, stage 3 unspecified: Secondary | ICD-10-CM | POA: Diagnosis not present

## 2024-04-03 DIAGNOSIS — I130 Hypertensive heart and chronic kidney disease with heart failure and stage 1 through stage 4 chronic kidney disease, or unspecified chronic kidney disease: Secondary | ICD-10-CM | POA: Diagnosis not present

## 2024-04-03 DIAGNOSIS — I509 Heart failure, unspecified: Secondary | ICD-10-CM | POA: Insufficient documentation

## 2024-04-03 DIAGNOSIS — I110 Hypertensive heart disease with heart failure: Secondary | ICD-10-CM | POA: Insufficient documentation

## 2024-04-03 DIAGNOSIS — Z9841 Cataract extraction status, right eye: Secondary | ICD-10-CM | POA: Insufficient documentation

## 2024-04-03 DIAGNOSIS — H5712 Ocular pain, left eye: Secondary | ICD-10-CM | POA: Diagnosis not present

## 2024-04-03 DIAGNOSIS — Z87891 Personal history of nicotine dependence: Secondary | ICD-10-CM | POA: Diagnosis not present

## 2024-04-03 DIAGNOSIS — H25812 Combined forms of age-related cataract, left eye: Secondary | ICD-10-CM | POA: Diagnosis not present

## 2024-04-03 DIAGNOSIS — I2510 Atherosclerotic heart disease of native coronary artery without angina pectoris: Secondary | ICD-10-CM | POA: Insufficient documentation

## 2024-04-03 DIAGNOSIS — H2181 Floppy iris syndrome: Secondary | ICD-10-CM | POA: Insufficient documentation

## 2024-04-03 HISTORY — PX: INSERTION, STENT, DRUG-ELUTING, LACRIMAL CANALICULUS: SHX7453

## 2024-04-03 HISTORY — PX: CATARACT EXTRACTION W/PHACO: SHX586

## 2024-04-03 SURGERY — PHACOEMULSIFICATION, CATARACT, WITH IOL INSERTION
Anesthesia: Monitor Anesthesia Care | Site: Eye | Laterality: Left

## 2024-04-03 MED ORDER — TETRACAINE HCL 0.5 % OP SOLN
1.0000 [drp] | OPHTHALMIC | Status: AC | PRN
  Administered 2024-04-03 (×3): 1 [drp] via OPHTHALMIC

## 2024-04-03 MED ORDER — LIDOCAINE HCL 3.5 % OP GEL
1.0000 | Freq: Once | OPHTHALMIC | Status: AC
  Administered 2024-04-03: 1 via OPHTHALMIC

## 2024-04-03 MED ORDER — DEXAMETHASONE 0.4 MG OP INST
VAGINAL_INSERT | OPHTHALMIC | Status: AC
  Filled 2024-04-03: qty 1

## 2024-04-03 MED ORDER — BSS IO SOLN
INTRAOCULAR | Status: DC | PRN
  Administered 2024-04-03: 15 mL via INTRAOCULAR

## 2024-04-03 MED ORDER — STERILE WATER FOR IRRIGATION IR SOLN
Status: DC | PRN
Start: 2024-04-03 — End: 2024-04-03
  Administered 2024-04-03: 1

## 2024-04-03 MED ORDER — DEXAMETHASONE 0.4 MG OP INST
VAGINAL_INSERT | OPHTHALMIC | Status: DC | PRN
  Administered 2024-04-03: .4 mg via OPHTHALMIC

## 2024-04-03 MED ORDER — MOXIFLOXACIN HCL 5 MG/ML IO SOLN
INTRAOCULAR | Status: DC | PRN
Start: 2024-04-03 — End: 2024-04-03
  Administered 2024-04-03: .3 mL via INTRACAMERAL

## 2024-04-03 MED ORDER — POVIDONE-IODINE 5 % OP SOLN
OPHTHALMIC | Status: DC | PRN
  Administered 2024-04-03: 1 via OPHTHALMIC

## 2024-04-03 MED ORDER — LACTATED RINGERS IV SOLN: INTRAVENOUS | Status: DC

## 2024-04-03 MED ORDER — PHENYLEPHRINE-KETOROLAC 1-0.3 % IO SOLN
INTRAOCULAR | Status: DC | PRN
  Administered 2024-04-03: 500 mL via OPHTHALMIC

## 2024-04-03 MED ORDER — SODIUM HYALURONATE 10 MG/ML IO SOLUTION
PREFILLED_SYRINGE | INTRAOCULAR | Status: DC | PRN
  Administered 2024-04-03: .85 mL via INTRAOCULAR

## 2024-04-03 MED ORDER — SODIUM HYALURONATE 23MG/ML IO SOSY
PREFILLED_SYRINGE | INTRAOCULAR | Status: DC | PRN
  Administered 2024-04-03: .6 mL via INTRAOCULAR

## 2024-04-03 MED ORDER — PHENYLEPHRINE HCL 2.5 % OP SOLN
1.0000 [drp] | OPHTHALMIC | Status: AC | PRN
  Administered 2024-04-03 (×3): 1 [drp] via OPHTHALMIC

## 2024-04-03 MED ORDER — LIDOCAINE HCL (PF) 1 % IJ SOLN
INTRAMUSCULAR | Status: DC | PRN
  Administered 2024-04-03: 2 mL

## 2024-04-03 MED ORDER — TROPICAMIDE 1 % OP SOLN
1.0000 [drp] | OPHTHALMIC | Status: AC | PRN
  Administered 2024-04-03 (×3): 1 [drp] via OPHTHALMIC

## 2024-04-03 SURGICAL SUPPLY — 13 items

## 2024-04-03 NOTE — Transfer of Care (Signed)
 Immediate Anesthesia Transfer of Care Note  Patient: Nicolas Butler  Procedure(s) Performed: PHACOEMULSIFICATION, CATARACT, WITH IOL INSERTION (Left: Eye) INSERTION, STENT, DRUG-ELUTING, LACRIMAL CANALICULUS (Left: Eye)  Patient Location: PACU  Anesthesia Type:MAC  Level of Consciousness: awake and alert   Airway & Oxygen Therapy: Patient Spontanous Breathing  Post-op Assessment: Report given to RN and Post -op Vital signs reviewed and stable  Post vital signs: Reviewed and stable  Last Vitals:  Vitals Value Taken Time  BP    Temp    Pulse    Resp    SpO2      Last Pain:  Vitals:   04/03/24 1128  TempSrc: Oral  PainSc: 0-No pain      Patients Stated Pain Goal: 5 (04/03/24 1128)  Complications: No notable events documented.

## 2024-04-03 NOTE — Discharge Instructions (Addendum)
 Please discharge patient when stable, will follow up today with Dr. June Leap at the [facility] office immediately following discharge.  Leave shield in place until visit.  All paperwork with discharge instructions will be given at the office.  [facility] Address:  [address]  [place], [place] [address]

## 2024-04-03 NOTE — Anesthesia Preprocedure Evaluation (Signed)
 Anesthesia Evaluation  Patient identified by MRN, date of birth, ID band Patient awake    Reviewed: Allergy & Precautions, H&P , NPO status , Patient's Chart, lab work & pertinent test results, reviewed documented beta blocker date and time   History of Anesthesia Complications (+) history of anesthetic complications  Airway Mallampati: II  TM Distance: >3 FB Neck ROM: full    Dental no notable dental hx.    Pulmonary shortness of breath, COPD, former smoker   Pulmonary exam normal breath sounds clear to auscultation       Cardiovascular Exercise Tolerance: Good hypertension, + CAD and +CHF   Rhythm:regular Rate:Normal     Neuro/Psych negative neurological ROS  negative psych ROS   GI/Hepatic negative GI ROS, Neg liver ROS,,,  Endo/Other  negative endocrine ROS    Renal/GU Renal disease  negative genitourinary   Musculoskeletal   Abdominal   Peds  Hematology  (+) Blood dyscrasia, anemia   Anesthesia Other Findings   Reproductive/Obstetrics negative OB ROS                             Anesthesia Physical Anesthesia Plan  ASA: 3  Anesthesia Plan: MAC   Post-op Pain Management:    Induction:   PONV Risk Score and Plan:   Airway Management Planned:   Additional Equipment:   Intra-op Plan:   Post-operative Plan:   Informed Consent: I have reviewed the patients History and Physical, chart, labs and discussed the procedure including the risks, benefits and alternatives for the proposed anesthesia with the patient or authorized representative who has indicated his/her understanding and acceptance.     Dental Advisory Given  Plan Discussed with: CRNA  Anesthesia Plan Comments:        Anesthesia Quick Evaluation

## 2024-04-03 NOTE — Op Note (Signed)
 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   Pain and inflammation following cataract surgery, Left Eye (H57.12)  Procedure:  Complex Removal of cataract via phacoemulsification and insertion of intra-ocular lens [employer] DIB00 +21.0D into the capsular bag of the Left Eye [phone]) 2. Placement of Dextenza  Implant, Left Lower Lid  Attending surgeon: Lynwood LABOR. Oslo Huntsman, MD, MA  Anesthesia: MAC, Topical Akten   Complications: None  Estimated Blood Loss: <72mL (minimal)  Specimens: None  Implants: As above  Indications:  Visually significant age-related cataract, Left Eye  Procedure:  The patient was seen and identified in the pre-operative area. The operative eye was identified and dilated.  The operative eye was marked.  Topical anesthesia was administered to the operative eye.     The patient was then to the operative suite and placed in the supine position.  A timeout was performed confirming the patient, procedure to be performed, and all other relevant information.   The patient's face was prepped and draped in the usual fashion for intra-ocular surgery.  A lid speculum was placed into the operative eye and the surgical microscope moved into place and focused. Poor dilation of the iris was confirmed. An inferotemporal paracentesis was created using a 20 gauge paracentesis blade.  Shugarcaine was injected into the anterior chamber.  Viscoelastic was injected into the anterior chamber.  A temporal clear-corneal main wound incision was created using a 2.57mm microkeratome.  A 7.16mm Malyugin ring was placed. A continuous curvilinear capsulorrhexis was initiated using an irrigating cystitome and completed using  capsulorrhexis forceps.  Hydrodissection and hydrodeliniation were performed.  Viscoelastic was injected into the anterior chamber.  A phacoemulsification handpiece and a chopper as a second instrument were used to remove the nucleus and epinucleus. The irrigation/aspiration handpiece was used to remove any remaining cortical material.   The capsular bag was reinflated with viscoelastic, checked, and found to be intact.  The intraocular lens was inserted into the capsular bag. The Malyugin ring was removed. The irrigation/aspiration handpiece was used to remove any remaining viscoelastic.  The clear corneal wound and paracentesis wounds were then hydrated and checked with Weck-Cels to be watertight. 0.1mL of Moxifloxacin  was injected into the anterior chamber. The lid-speculum was removed.    The lower canaliculus was dilated and filled with Provisc. A Dextenza  implant was placed in the lower canaliculus without complication.  The drape was removed.  The patient's face was cleaned with a wet and dry 4x4.   A clear shield was taped over the eye. The patient was taken to the post-operative care unit in good condition, having tolerated the procedure well.  Post-Op Instructions: The patient will follow up at [facility] for a same day post-operative evaluation and will receive all other orders and instructions.

## 2024-04-03 NOTE — Interval H&P Note (Signed)
 History and Physical Interval Note:  04/03/2024 12:43 PM  Nicolas Butler  has presented today for surgery, with the diagnosis of nuclear sclerotic cataract, left eye.  The various methods of treatment have been discussed with the patient and family. After consideration of risks, benefits and other options for treatment, the patient has consented to  Procedure(s): PHACOEMULSIFICATION, CATARACT, WITH IOL INSERTION (Left) INSERTION, STENT, DRUG-ELUTING, LACRIMAL CANALICULUS (Left) as a surgical intervention.  The patient's history has been reviewed, patient examined, no change in status, stable for surgery.  I have reviewed the patient's chart and labs.  Questions were answered to the patient's satisfaction.     HARRIE AGENT

## 2024-04-04 ENCOUNTER — Encounter (HOSPITAL_COMMUNITY): Payer: Self-pay | Admitting: Ophthalmology

## 2024-04-05 ENCOUNTER — Inpatient Hospital Stay: Admitting: Hematology

## 2024-04-05 VITALS — BP 150/80 | HR 56 | Temp 98.0°F | Resp 18 | Ht 69.0 in | Wt 201.9 lb

## 2024-04-05 DIAGNOSIS — C3491 Malignant neoplasm of unspecified part of right bronchus or lung: Secondary | ICD-10-CM

## 2024-04-05 DIAGNOSIS — Z08 Encounter for follow-up examination after completed treatment for malignant neoplasm: Secondary | ICD-10-CM | POA: Diagnosis not present

## 2024-04-05 NOTE — Progress Notes (Signed)
 [facility] [address] [place] [address]    Clinic Day:  04/05/24   Referring physician: Jolee Elsie RAMAN, PA  Patient Care Team: Jolee Elsie RAMAN, [place] as PCP - General Branch, Dorn FALCON, MD as PCP - Cardiology (Cardiology) Celestia Joesph SQUIBB, RN as Oncology Nurse Navigator (Oncology) [name], MD as Medical Oncologist (Medical Oncology) Miriam Norris, NP as Nurse Practitioner (Cardiology) Darlean Ozell NOVAK, MD as Consulting Physician (Pulmonary Disease)   ASSESSMENT & PLAN:   Assessment: 1.  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  Anteromedial right upper lobe distal airway enlargement appears FDG avid with SUV of 13.6 concerning for endobronchial spread of tumor.  No signs of FDG avid mediastinal or hilar adenopathy or distant metastatic disease.  Multiple tiny peripheral nodules identified within the right upper lobe.  Bilateral adrenal gland adenomas. -Bronchoscopy and biopsy on 07/02/2020 showed poorly differentiated squamous cell carcinoma. -Right upper lobectomy and lymph node dissection by Dr. Kerrin on 07/22/2020. -Pathology showed 5.1 cm moderately differentiated squamous cell carcinoma involving right upper lobe bronchus, visceral pleura not involved.  Bronchovascular resection margins are negative.  Negative lymphatic or vascular invasion.  Lymph nodes at level 7, levels 9, 11, 12 are negative for carcinoma.  pT3 PN0.0/16 lymph nodes involved. - PD-L1 0% - Adjuvant chemotherapy was recommended but declined by the patient.   2.  Social/family history: -He lives at home by himself and is independent of all ADLs and IADLs. -He is accompanied by his daughter who works as a Engineer, civil (consulting) in cardiovascular surgery department. -He worked in [place] and quit smoking in 04/20/13. -Brother died  of metastatic cancer.  Maternal cousin had bone cancer.  Plan: 1.  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  No change in baseline cough.  No hemoptysis. - Reviewed labs from 03/27/2024: Creatinine from CKD stable at 1.38.  CBC normal. - CT chest without contrast from 03/27/2024 reviewed by me showed no evidence of recurrence.  Stable bilateral lung nodules.  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 - Recommend follow-up in 1 year with repeat CT chest without contrast and labs.  Orders Placed This Encounter  Procedures   CT CHEST W CONTRAST    Standing Status:   Future    Expiration Date:   04/05/2025    If indicated for the ordered procedure, I authorize the administration of contrast media per Radiology protocol:   Yes    Does the patient have a contrast media/X-ray dye allergy?:   No    Preferred imaging location?:   [facility]   CBC with Differential    Standing Status:   Future    Expiration Date:   04/05/2025   Comprehensive metabolic panel    Standing Status:   Future    Expiration Date:   04/05/2025      [name],acting as a scribe for Hai Rogers, MD.,have documented all relevant documentation on the behalf of Hai Rogers, MD,as directed by  Hai Rogers, MD while in the presence of Hai Rogers, MD.  I, Hai Rogers MD, have reviewed the above documentation for accuracy and completeness, and I agree with the above.     Hai Rogers, MD   7/30/202510:57 AM  CHIEF COMPLAINT:   Diagnosis: right squamous  cell lung carcinoma    Cancer Staging  Malignant neoplasm of upper lobe of right lung (HCC) Staging form: Lung, AJCC 8th Edition - Clinical stage from 07/11/2020: Stage IA3 (cT1c, cN0, cM0) - Unsigned - Pathologic stage from 07/25/2020: Stage IIB (pT3, pN0, cM0) - Signed by Kerrin Elspeth BROCKS, MD on 07/25/2020    Prior Therapy: Right  upper lobectomy and lymph node dissection on 07/22/2020   NGS Results: Foundation 1 MS-stable, TMB 19 Muts/Mb, PD-L1 TPS 0%  Current Therapy:  surveillance   HISTORY OF PRESENT ILLNESS:   Oncology History  Malignant neoplasm of upper lobe of right lung (HCC)  07/11/2020 Initial Diagnosis   Malignant neoplasm of upper lobe of right lung (HCC)   07/25/2020 Cancer Staging   Staging form: Lung, AJCC 8th Edition - Pathologic stage from 07/25/2020: Stage IIB (pT3, pN0, cM0) - Signed by Kerrin Elspeth BROCKS, MD on 07/25/2020   09/20/2020 Genetic Testing   PD-L1 Results:     09/23/2020 - 09/23/2020 Chemotherapy   Patient is on Treatment Plan : LUNG Carboplatin + Paclitaxel q21d     09/26/2020 Genetic Testing   Foundation One Results:        INTERVAL HISTORY:   Nicolas Butler is a 73 y.o. male presenting to clinic today for follow up of right squamous cell lung carcinoma. He was last seen by me on 10/05/2023.  Since his last visit, he underwent CT chest on 03/27/2024 that found: Stable exam without evidence of local recurrence or metastatic disease. Stable bilateral lung nodules measuring up to 5 mm. Emphysema. Old right upper lobectomy. Aortic and coronary artery atherosclerosis. 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 Stable bilateral adrenal adenomas.   Today, he states that he is doing well overall. His appetite level is at 100%. His energy level is at 100%.  PAST MEDICAL HISTORY:   Past Medical History: Past Medical History:  Diagnosis Date   AAA (abdominal aortic aneurysm) (HCC)    CHF (congestive heart failure) (HCC)    Chronic kidney disease (CKD), stage III (moderate) (HCC)    Complication of anesthesia    Bowel and bladder are slow to wake up after surgery   COPD (chronic obstructive pulmonary disease) (HCC)    Coronary artery disease    presumed   Dyspnea    exertion   High cholesterol    Lung cancer (HCC)    Unspecified essential hypertension     Surgical  History: Past Surgical History:  Procedure Laterality Date   ABDOMINAL AORTIC ANEURYSM REPAIR N/A 06/28/2017   Procedure: ANEURYSM ABDOMINAL AORTIC REPAIR OPEN;  Surgeon: Harvey Carlin BRAVO, MD;  Location: [facility] OR;  Service: Vascular;  Laterality: N/A;   BRONCHIAL BRUSHINGS  07/02/2020   Procedure: BRONCHIAL BRUSHINGS;  Surgeon: Meade Verdon RAMAN, MD;  Location: [facility];  Service: Pulmonary;;   BRONCHIAL WASHINGS  07/02/2020   Procedure: BRONCHIAL WASHINGS;  Surgeon: Meade Verdon RAMAN, MD;  Location: [facility];  Service: Pulmonary;;  BAL and washings   CATARACT EXTRACTION W/PHACO Right 02/28/2024   Procedure: PHACOEMULSIFICATION, CATARACT, WITH IOL INSERTION;  Surgeon: Harrie Agent, MD;  Location: [facility];  Service: Ophthalmology;  Laterality: Right;  CDE: 15.97   CATARACT EXTRACTION W/PHACO Left 04/03/2024   Procedure: PHACOEMULSIFICATION, CATARACT, WITH IOL INSERTION;  Surgeon: Harrie Agent, MD;  Location: [facility];  Service: Ophthalmology;  Laterality: Left;  CDE 24.47   HAND SURGERY Left    HERNIA REPAIR     INCISIONAL HERNIA REPAIR     LEFT   INSERTION,  STENT, DRUG-ELUTING, LACRIMAL CANALICULUS Right 02/28/2024   Procedure: INSERTION, STENT, DRUG-ELUTING, LACRIMAL CANALICULUS;  Surgeon: Harrie Agent, MD;  Location: [facility];  Service: Ophthalmology;  Laterality: Right;   INSERTION, STENT, DRUG-ELUTING, LACRIMAL CANALICULUS Left 04/03/2024   Procedure: INSERTION, STENT, DRUG-ELUTING, LACRIMAL CANALICULUS;  Surgeon: Harrie Agent, MD;  Location: [facility];  Service: Ophthalmology;  Laterality: Left;   INTERCOSTAL NERVE BLOCK Right 07/22/2020   Procedure: INTERCOSTAL NERVE BLOCK;  Surgeon: Kerrin Elspeth BROCKS, MD;  Location: [facility] OR;  Service: Thoracic;  Laterality: Right;   IRRIGATION AND DEBRIDEMENT SEBACEOUS CYST     FROM RIGHT SHOULDER   LUNG BIOPSY  07/02/2020   Procedure: LUNG BIOPSY;  Surgeon: Meade Verdon RAMAN, MD;  Location: [facility];  Service: Pulmonary;;   NODE DISSECTION Right  07/22/2020   Procedure: NODE DISSECTION;  Surgeon: Kerrin Elspeth BROCKS, MD;  Location: [facility] OR;  Service: Thoracic;  Laterality: Right;   UMBILICAL HERNIA REPAIR     VIDEO BRONCHOSCOPY Right 07/02/2020   Procedure: VIDEO BRONCHOSCOPY WITHOUT FLUORO;  Surgeon: Meade Verdon RAMAN, MD;  Location: [facility];  Service: Pulmonary;  Laterality: Right;    Social History: Social History   Socioeconomic History   Marital status: Legally Separated    Spouse name: Not on file   Number of children: 2   Years of education: Not on file   Highest education level: Not on file  Occupational History   Occupation: retired  Tobacco Use   Smoking status: Former    Current packs/day: 0.00    Average packs/day: 1.5 packs/day for 47.6 years (71.3 ttl pk-yrs)    Types: Cigarettes    Start date: 05/17/1965    Quit date: 12/08/2012    Years since quitting: 11.3   Smokeless tobacco: Never  Vaping Use   Vaping status: Never Used  Substance and Sexual Activity   Alcohol  use: No    Alcohol /week: 0.0 standard drinks of alcohol    Drug use: No   Sexual activity: Not Currently  Other Topics Concern   Not on file  Social History Narrative   Not on file   Social Drivers of Health   Financial Resource Strain: Low Risk  (09/02/2020)   Overall Financial Resource Strain (CARDIA)    Difficulty of Paying Living Expenses: Not hard at all  Food Insecurity: No Food Insecurity (09/02/2020)   Hunger Vital Sign    Worried About Running Out of Food in the Last Year: Never true    Ran Out of Food in the Last Year: Never true  Transportation Needs: No Transportation Needs (09/02/2020)   PRAPARE - Administrator, Civil Service (Medical): No    Lack of Transportation (Non-Medical): No  Physical Activity: Insufficiently Active (09/02/2020)   Exercise Vital Sign    Days of Exercise per Week: 1 day    Minutes of Exercise per Session: 20 min  Stress: No Stress Concern Present (09/02/2020)   [employer]  of Occupational Health - Occupational Stress Questionnaire    Feeling of Stress : Not at all  Social Connections: Moderately Isolated (09/02/2020)   Social Connection and Isolation Panel    Frequency of Communication with Friends and Family: Three times a week    Frequency of Social Gatherings with Friends and Family: Twice a week    Attends Religious Services: More than 4 times per year    Active Member of [employer] or Organizations: No    Attends Banker Meetings: Never    Marital Status: Separated  Intimate Partner Violence: Not At Risk (09/02/2020)   Humiliation, Afraid, Rape, and Kick questionnaire    Fear of Current or Ex-Partner: No    Emotionally Abused: No    Physically Abused: No    Sexually Abused: No    Family History: Family History  Problem Relation Age of Onset   Pneumonia Mother    Other Father        brain tumor   Diabetes Son    Lung cancer Neg Hx     Current Medications:  Current Outpatient Medications:    losartan (COZAAR) 25 MG tablet, Take 25 mg by mouth daily., Disp: , Rfl:    albuterol  (PROVENTIL ) (2.5 MG/3ML) 0.083% nebulizer solution, Take 2.5 mg by nebulization every 6 (six) hours as needed for wheezing or shortness of breath.  (Patient not taking: Reported on 12/15/2023), Disp: , Rfl:    albuterol  (VENTOLIN  HFA) 108 (90 Base) MCG/ACT inhaler, INHALE 2 PUFFS INTO THE LUNGS EVERY 6 HOURS AS NEEDED, Disp: 18 each, Rfl: 5   amLODipine  (NORVASC ) 5 MG tablet, TAKE 1 TABLET (5 MG TOTAL) BY MOUTH DAILY., Disp: 90 tablet, Rfl: 3   aspirin  EC 81 MG tablet, Take 1 tablet (81 mg total) by mouth daily. Swallow whole., Disp: 90 tablet, Rfl: 3   atorvastatin  (LIPITOR ) 80 MG tablet, Take 1 tablet (80 mg total) by mouth every other day. AT NIGHT, Disp: 45 tablet, Rfl: 3   Budeson-Glycopyrrol-Formoterol (BREZTRI  AEROSPHERE) 160-9-4.8 MCG/ACT AERO, Take 2 puffs first thing in am and then another 2 puffs about 12 hours later., Disp: 10.7 each, Rfl: 11   carvedilol   (COREG ) 3.125 MG tablet, TAKE 1 TABLET BY MOUTH 2 TIMES DAILY., Disp: 180 tablet, Rfl: 3   Cholecalciferol (VITAMIN D3) 5000 units CAPS, Take 10,000 Units by mouth daily after breakfast. , Disp: , Rfl:    ezetimibe  (ZETIA ) 10 MG tablet, Take 1 tablet (10 mg total) by mouth daily., Disp: 90 tablet, Rfl: 1   olmesartan  (BENICAR ) 40 MG tablet, Take 1 tablet (40 mg total) by mouth daily., Disp: 30 tablet, Rfl: 11   Allergies: Allergies  Allergen Reactions   Hydrochlorothiazide  Other (See Comments)    Pt states it made my legs hurt   Pravastatin Other (See Comments)    Muscle pain    REVIEW OF SYSTEMS:   Review of Systems  Constitutional:  Negative for chills, fatigue and fever.  HENT:   Negative for lump/mass, mouth sores, nosebleeds, sore throat and trouble swallowing.   Eyes:  Negative for eye problems.  Respiratory:  Positive for cough. Negative for shortness of breath.   Cardiovascular:  Negative for chest pain, leg swelling and palpitations.  Gastrointestinal:  Negative for abdominal pain, constipation, diarrhea, nausea and vomiting.  Genitourinary:  Negative for bladder incontinence, difficulty urinating, dysuria, frequency, hematuria and nocturia.   Musculoskeletal:  Negative for arthralgias, back pain, flank pain, myalgias and neck pain.  Skin:  Negative for itching and rash.  Neurological:  Negative for dizziness, headaches and numbness.  Hematological:  Does not bruise/bleed easily.  Psychiatric/Behavioral:  Negative for depression, sleep disturbance and suicidal ideas. The patient is not nervous/anxious.   All other systems reviewed and are negative.    VITALS:   Blood pressure (!) 150/80, pulse (!) 56, temperature 98 F (36.7 C), resp. rate 18, height 5' 9 (1.753 m), weight 201 lb 15.1 oz (91.6 kg), SpO2 98%.  Wt Readings from Last 3 Encounters:  04/05/24 201 lb 15.1 oz (91.6 kg)  03/29/24 205  lb 14.6 oz (93.4 kg)  02/28/24 200 lb (90.7 kg)    Body mass index is  29.82 kg/m.  Performance status (ECOG): 1 - Symptomatic but completely ambulatory  PHYSICAL EXAM:   Physical Exam Vitals and nursing note reviewed. Exam conducted with a chaperone present.  Constitutional:      Appearance: Normal appearance.  Cardiovascular:     Rate and Rhythm: Normal rate and regular rhythm.     Pulses: Normal pulses.     Heart sounds: Normal heart sounds.  Pulmonary:     Effort: Pulmonary effort is normal.     Breath sounds: Normal breath sounds.  Abdominal:     Palpations: Abdomen is soft. There is no hepatomegaly, splenomegaly or mass.     Tenderness: There is no abdominal tenderness.  Musculoskeletal:     Right lower leg: No edema.     Left lower leg: No edema.  Lymphadenopathy:     Cervical: No cervical adenopathy.     Right cervical: No superficial, deep or posterior cervical adenopathy.    Left cervical: No superficial, deep or posterior cervical adenopathy.     Upper Body:     Right upper body: No supraclavicular or axillary adenopathy.     Left upper body: No supraclavicular or axillary adenopathy.  Neurological:     General: No focal deficit present.     Mental Status: He is alert and oriented to person, place, and time.  Psychiatric:        Mood and Affect: Mood normal.        Behavior: Behavior normal.     LABS:      Latest Ref Rng & Units 03/27/2024    7:39 AM 09/28/2023    7:43 AM 03/16/2023   12:44 PM  CBC  WBC 4.0 - 10.5 K/uL 7.9  6.6  6.9   Hemoglobin 13.0 - 17.0 g/dL 84.3  83.9  84.4   Hematocrit 39.0 - 52.0 % 45.2  46.3  43.5   Platelets 150 - 400 K/uL 182  182  190       Latest Ref Rng & Units 03/27/2024    7:39 AM 09/28/2023    7:43 AM 03/16/2023   12:44 PM  CMP  Glucose 70 - 99 mg/dL 894  98  93   BUN 8 - 23 mg/dL 18  17  19    Creatinine 0.61 - 1.24 mg/dL 8.61  8.71  8.50   Sodium 135 - 145 mmol/L 134  136  135   Potassium 3.5 - 5.1 mmol/L 3.8  4.4  4.0   Chloride 98 - 111 mmol/L 99  102  104   CO2 22 - 32 mmol/L 25  25   24    Calcium  8.9 - 10.3 mg/dL 8.9  9.4  8.9   Total Protein 6.5 - 8.1 g/dL 7.7  7.7  7.6   Total Bilirubin 0.0 - 1.2 mg/dL 0.8  0.8  0.7   Alkaline Phos 38 - 126 U/L 79  73  65   AST 15 - 41 U/L 15  19  19    ALT 0 - 44 U/L 13  20  16       No results found for: CEA1, CEA / No results found for: CEA1, CEA No results found for: PSA1 No results found for: CAN199 No results found for: CAN125  No results found for: TOTALPROTELP, ALBUMINELP, A1GS, A2GS, BETS, BETA2SER, GAMS, MSPIKE, SPEI No results found for: TIBC, FERRITIN, IRONPCTSAT No results found for:  LDH   STUDIES:   CT Chest Wo Contrast Result Date: 03/31/2024 CLINICAL DATA:  Non-small cell lung cancer monitoring exam. Therapy status not specified. EXAM: CT CHEST WITHOUT CONTRAST TECHNIQUE: Multidetector CT imaging of the chest was performed following the standard protocol without IV contrast. RADIATION DOSE REDUCTION: This exam was performed according to the departmental dose-optimization program which includes automated exposure control, adjustment of the mA and/or kV according to patient size and/or use of iterative reconstruction technique. COMPARISON:  CHEST CTS WITHOUT CONTRAST 09/28/2023 AND 03/16/2023, AND CHEST CT WITH CONTRAST 09/14/2022. FINDINGS: Cardiovascular: The cardiac size is normal. There is no significant pericardial fluid. There are moderate left main and three-vessel coronary artery calcifications. Moderate aortic tortuosity. There are moderate to heavy calcific plaques throughout the aorta, scattered calcification in the great vessels. The ascending aorta is 4.2 x 4.2 cm, stable, on 5:53 and 6:81. Remaining aorta is ectatic but nonaneurysmal. The pulmonary veins are nondistended. Chronically enlarged pulmonary trunk again 3.3 cm indicating arterial hypertension. Mediastinum/Nodes: No enlarged mediastinal or axillary lymph nodes are identified with limited noncontrast technique.  Thyroid gland, trachea, and esophagus demonstrate no significant findings. There is a small hiatal hernia. Lungs/Pleura: Right upper lobectomy with chronic volume loss and scarring change. Adjacent right lower lobe rounded nodules each measuring 5 mm are stably redemonstrated on axial 65 and 68 of series 3. There is a stable 4 mm pleural-based left lower lobe nodule on 3:103. Stable 5 mm pleural-based left upper lobe nodule anteriorly on 3:49, and a chronic 4 mm upper lobe nodule laterally on 3:56. The lungs are mildly emphysematous, centrilobular changes predominating. Small right posterolateral tracheal diverticulum is again shown. There is no consolidation, effusion, new or progressive nodules are evidence of local recurrence. Upper Abdomen: Stable right 1.5 cm and left 2.5 cm adrenal adenomas both slightly negative in Hounsfield density. There is abdominal aortic and visceral branch vessel atherosclerosis. No acute upper abdominal findings without contrast. Musculoskeletal: No regional bone metastasis is seen. Osteopenia and degenerative change thoracic spine. IMPRESSION: 1. Stable exam without evidence of local recurrence or metastatic disease. 2. Stable bilateral lung nodules measuring up to 5 mm. 3. Emphysema.  Old right upper lobectomy. 4. Aortic and coronary artery atherosclerosis. 5. 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 Recommend annual imaging followup by CTA or MRA. This recommendation follows 2010 ACCF/AHA/AATS/ACR/ASA/SCA/SCAI/SIR/STS/ SVM Guidelines for the Diagnosis and Management of Patients with Thoracic Aortic Disease. Circulation. 2010; 121: [id number]. Aortic aneurysm NOS (ICD10-I71.9). 6. Stable bilateral adrenal adenomas. Aortic Atherosclerosis (ICD10-I70.0) and Emphysema (ICD10-J43.9). Electronically Signed   By: Francis Quam M.D.   On: 03/31/2024 23:20

## 2024-04-05 NOTE — Patient Instructions (Signed)
 [facility] at [facility] Discharge Instructions   You were seen and examined today by Dr. Ellin Saba.  He reviewed the results of your lab work which are normal/stable.   We will see you back in one year. We will repeat lab work and a CT scan prior to this visit.   Return as scheduled.    Thank you for choosing [facility] at [facility] to provide your oncology and hematology care.  To afford each patient quality time with our provider, please arrive at least 15 minutes before your scheduled appointment time.   If you have a lab appointment with the Cancer Center please come in thru the Main Entrance and check in at the main information desk.  You need to re-schedule your appointment should you arrive 10 or more minutes late.  We strive to give you quality time with our providers, and arriving late affects you and other patients whose appointments are after yours.  Also, if you no show three or more times for appointments you may be dismissed from the clinic at the providers discretion.     Again, thank you for choosing [facility].  Our hope is that these requests will decrease the amount of time that you wait before being seen by our physicians.       _____________________________________________________________  Should you have questions after your visit to [facility], please contact our office at [phone] and follow the prompts.  Our office hours are 8:00 a.m. and 4:30 p.m. Monday - Friday.  Please note that voicemails left after 4:00 p.m. may not be returned until the following business day.  We are closed weekends and major holidays.  You do have access to a nurse 24-7, just call the main number to the clinic [phone] and do not press any options, hold on the line and a nurse will answer the phone.    For prescription refill requests, have your pharmacy contact our office and allow 72 hours.    Due to  Covid, you will need to wear a mask upon entering the hospital. If you do not have a mask, a mask will be given to you at the Main Entrance upon arrival. For doctor visits, patients may have 1 support person age 38 or older with them. For treatment visits, patients can not have anyone with them due to social distancing guidelines and our immunocompromised population.

## 2024-04-07 NOTE — Anesthesia Postprocedure Evaluation (Signed)
 Anesthesia Post Note  Patient: Nicolas Butler  Procedure(s) Performed: PHACOEMULSIFICATION, CATARACT, WITH IOL INSERTION (Left: Eye) INSERTION, STENT, DRUG-ELUTING, LACRIMAL CANALICULUS (Left: Eye)  Patient location during evaluation: Phase II Anesthesia Type: MAC Level of consciousness: awake Pain management: pain level controlled Vital Signs Assessment: post-procedure vital signs reviewed and stable Respiratory status: spontaneous breathing and respiratory function stable Cardiovascular status: blood pressure returned to baseline and stable Postop Assessment: no headache and no apparent nausea or vomiting Anesthetic complications: no Comments: Late entry   No notable events documented.   Last Vitals:  Vitals:   04/03/24 1128 04/03/24 1319  BP: (!) 158/83 (!) 170/84  Pulse: (!) 51 (!) 50  Resp:  16  Temp: 36.7 C 37.3 C  SpO2: 98% 99%    Last Pain:  Vitals:   04/04/24 1115  TempSrc:   PainSc: 0-No pain                 Yvonna JINNY [name]

## 2024-05-07 ENCOUNTER — Other Ambulatory Visit: Payer: Self-pay | Admitting: Cardiology

## 2024-05-24 DIAGNOSIS — Z130 Encounter for screening for diseases of the blood and blood-forming organs and certain disorders involving the immune mechanism: Secondary | ICD-10-CM | POA: Diagnosis not present

## 2024-05-24 DIAGNOSIS — R739 Hyperglycemia, unspecified: Secondary | ICD-10-CM | POA: Diagnosis not present

## 2024-05-24 DIAGNOSIS — Z125 Encounter for screening for malignant neoplasm of prostate: Secondary | ICD-10-CM | POA: Diagnosis not present

## 2024-05-24 DIAGNOSIS — N183 Chronic kidney disease, stage 3 unspecified: Secondary | ICD-10-CM | POA: Diagnosis not present

## 2024-05-24 DIAGNOSIS — Z0001 Encounter for general adult medical examination with abnormal findings: Secondary | ICD-10-CM | POA: Diagnosis not present

## 2024-05-24 DIAGNOSIS — I10 Essential (primary) hypertension: Secondary | ICD-10-CM | POA: Diagnosis not present

## 2024-06-01 DIAGNOSIS — I509 Heart failure, unspecified: Secondary | ICD-10-CM | POA: Diagnosis not present

## 2024-06-01 DIAGNOSIS — Z6830 Body mass index (BMI) 30.0-30.9, adult: Secondary | ICD-10-CM | POA: Diagnosis not present

## 2024-06-01 DIAGNOSIS — J449 Chronic obstructive pulmonary disease, unspecified: Secondary | ICD-10-CM | POA: Diagnosis not present

## 2024-06-01 DIAGNOSIS — I714 Abdominal aortic aneurysm, without rupture, unspecified: Secondary | ICD-10-CM | POA: Diagnosis not present

## 2024-06-01 DIAGNOSIS — Z85118 Personal history of other malignant neoplasm of bronchus and lung: Secondary | ICD-10-CM | POA: Diagnosis not present

## 2024-06-05 DIAGNOSIS — H524 Presbyopia: Secondary | ICD-10-CM | POA: Diagnosis not present

## 2024-10-10 ENCOUNTER — Other Ambulatory Visit: Payer: Self-pay | Admitting: Internal Medicine

## 2024-12-21 ENCOUNTER — Ambulatory Visit: Admitting: Internal Medicine

## 2025-04-03 ENCOUNTER — Other Ambulatory Visit (HOSPITAL_COMMUNITY)

## 2025-04-03 ENCOUNTER — Other Ambulatory Visit

## 2025-04-10 ENCOUNTER — Ambulatory Visit: Admitting: Physician Assistant
